# Patient Record
Sex: Male | Born: 1950 | Race: White | Hispanic: No | Marital: Married | Smoking: Former smoker
Health system: Southern US, Community
[De-identification: ages and names within clinical notes are randomized; demographics above are authoritative.]

## PROBLEM LIST (undated history)

## (undated) DIAGNOSIS — I4892 Unspecified atrial flutter: Principal | ICD-10-CM

## (undated) DIAGNOSIS — Z8739 Personal history of other diseases of the musculoskeletal system and connective tissue: Secondary | ICD-10-CM

## (undated) DIAGNOSIS — D474 Osteomyelofibrosis: Principal | ICD-10-CM

## (undated) DIAGNOSIS — Z862 Personal history of diseases of the blood and blood-forming organs and certain disorders involving the immune mechanism: Secondary | ICD-10-CM

## (undated) HISTORY — DX: Osteomyelofibrosis: D47.4

## (undated) HISTORY — PX: TONSILLECTOMY: SUR1361

## (undated) HISTORY — PX: OTHER SURGICAL HISTORY: SHX169

## (undated) HISTORY — PX: BACK SURGERY: SHX140

---

## 2003-07-15 ENCOUNTER — Other Ambulatory Visit: Admission: RE | Admit: 2003-07-15 | Discharge: 2003-07-15 | Payer: Self-pay | Admitting: Dermatology

## 2007-09-22 ENCOUNTER — Encounter: Admission: RE | Admit: 2007-09-22 | Discharge: 2007-09-22 | Payer: Self-pay | Admitting: Internal Medicine

## 2007-10-08 ENCOUNTER — Ambulatory Visit: Payer: Self-pay | Admitting: Hematology & Oncology

## 2007-11-10 LAB — CBC & DIFF AND RETIC
Basophils Absolute: 0 10*3/uL (ref 0.0–0.1)
EOS%: 0.3 % (ref 0.0–7.0)
HCT: 36.4 % — ABNORMAL LOW (ref 38.7–49.9)
HGB: 12.7 g/dL — ABNORMAL LOW (ref 13.0–17.1)
IRF: 0.47 — ABNORMAL HIGH (ref 0.070–0.380)
MCH: 31.8 pg (ref 28.0–33.4)
MCV: 91.3 fL (ref 81.6–98.0)
NEUT%: 88.6 % — ABNORMAL HIGH (ref 40.0–75.0)
Platelets: 342 10*3/uL (ref 145–400)
lymph#: 0.8 10*3/uL — ABNORMAL LOW (ref 0.9–3.3)

## 2007-11-10 LAB — CHCC SMEAR

## 2007-11-12 LAB — BETA 2 MICROGLOBULIN, SERUM: Beta-2 Microglobulin: 3.85 mg/L — ABNORMAL HIGH (ref 1.01–1.73)

## 2007-11-12 LAB — SPEP & IFE WITH QIG
Alpha-1-Globulin: 5.6 % — ABNORMAL HIGH (ref 2.9–4.9)
Alpha-2-Globulin: 8.3 % (ref 7.1–11.8)
Gamma Globulin: 12 % (ref 11.1–18.8)
Total Protein, Serum Electrophoresis: 6.8 g/dL (ref 6.0–8.3)

## 2007-11-12 LAB — HEMOGLOBINOPATHY EVALUATION
Hgb A2 Quant: 2.3 % (ref 2.2–3.2)
Hgb F Quant: 0.2 % (ref 0.0–2.0)

## 2007-12-30 ENCOUNTER — Ambulatory Visit: Payer: Self-pay | Admitting: Hematology & Oncology

## 2007-12-30 ENCOUNTER — Encounter: Payer: Self-pay | Admitting: Hematology & Oncology

## 2007-12-30 ENCOUNTER — Ambulatory Visit (HOSPITAL_COMMUNITY): Admission: RE | Admit: 2007-12-30 | Discharge: 2007-12-30 | Payer: Self-pay | Admitting: Hematology & Oncology

## 2008-01-05 ENCOUNTER — Ambulatory Visit: Payer: Self-pay | Admitting: Hematology & Oncology

## 2008-01-07 LAB — CBC WITH DIFFERENTIAL/PLATELET
Basophils Absolute: 0 10*3/uL (ref 0.0–0.1)
Eosinophils Absolute: 0.1 10*3/uL (ref 0.0–0.5)
HGB: 12.6 g/dL — ABNORMAL LOW (ref 13.0–17.1)
MCV: 92 fL (ref 81.6–98.0)
MONO#: 0.9 10*3/uL (ref 0.1–0.9)
NEUT#: 10.5 10*3/uL — ABNORMAL HIGH (ref 1.5–6.5)
RBC: 4.09 10*6/uL — ABNORMAL LOW (ref 4.20–5.71)
RDW: 19.8 % — ABNORMAL HIGH (ref 11.2–14.6)
WBC: 12.4 10*3/uL — ABNORMAL HIGH (ref 4.0–10.0)
lymph#: 0.9 10*3/uL (ref 0.9–3.3)

## 2008-01-07 LAB — CHCC SMEAR

## 2008-04-07 ENCOUNTER — Ambulatory Visit: Payer: Self-pay | Admitting: Hematology & Oncology

## 2008-04-08 LAB — CBC WITH DIFFERENTIAL (CANCER CENTER ONLY)
BASO#: 0.1 10*3/uL (ref 0.0–0.2)
Eosinophils Absolute: 0.2 10*3/uL (ref 0.0–0.5)
HCT: 38.1 % — ABNORMAL LOW (ref 38.7–49.9)
HGB: 12.9 g/dL — ABNORMAL LOW (ref 13.0–17.1)
LYMPH#: 1 10*3/uL (ref 0.9–3.3)
MCH: 30.4 pg (ref 28.0–33.4)
MCHC: 33.8 g/dL (ref 32.0–35.9)
NEUT#: 10.3 10*3/uL — ABNORMAL HIGH (ref 1.5–6.5)
NEUT%: 83.5 % — ABNORMAL HIGH (ref 40.0–80.0)
RBC: 4.24 10*6/uL (ref 4.20–5.70)

## 2008-04-08 LAB — CHCC SATELLITE - SMEAR

## 2008-08-10 ENCOUNTER — Ambulatory Visit: Payer: Self-pay | Admitting: Hematology & Oncology

## 2008-08-11 LAB — CHCC SATELLITE - SMEAR

## 2008-08-11 LAB — CBC WITH DIFFERENTIAL (CANCER CENTER ONLY)
BASO#: 0.1 10*3/uL (ref 0.0–0.2)
EOS%: 1 % (ref 0.0–7.0)
HCT: 38.7 % (ref 38.7–49.9)
HGB: 13 g/dL (ref 13.0–17.1)
LYMPH#: 1 10*3/uL (ref 0.9–3.3)
MCHC: 33.5 g/dL (ref 32.0–35.9)
MONO#: 0.9 10*3/uL (ref 0.1–0.9)
NEUT#: 9.7 10*3/uL — ABNORMAL HIGH (ref 1.5–6.5)
NEUT%: 82.2 % — ABNORMAL HIGH (ref 40.0–80.0)
RBC: 4.23 10*6/uL (ref 4.20–5.70)
WBC: 11.8 10*3/uL — ABNORMAL HIGH (ref 4.0–10.0)

## 2009-01-04 ENCOUNTER — Ambulatory Visit: Payer: Self-pay | Admitting: Hematology & Oncology

## 2009-01-06 LAB — RETICULOCYTES (CHCC): Retic Ct Pct: 2.4 % (ref 0.4–3.1)

## 2009-01-06 LAB — CBC WITH DIFFERENTIAL (CANCER CENTER ONLY)
BASO%: 0.5 % (ref 0.0–2.0)
EOS%: 1.2 % (ref 0.0–7.0)
HCT: 37.3 % — ABNORMAL LOW (ref 38.7–49.9)
LYMPH#: 0.9 10*3/uL (ref 0.9–3.3)
MCHC: 33.2 g/dL (ref 32.0–35.9)
MONO#: 0.5 10*3/uL (ref 0.1–0.9)
NEUT#: 8.4 10*3/uL — ABNORMAL HIGH (ref 1.5–6.5)
Platelets: 283 10*3/uL (ref 145–400)
RDW: 17.5 % — ABNORMAL HIGH (ref 10.5–14.6)
WBC: 10 10*3/uL (ref 4.0–10.0)

## 2009-01-13 LAB — JAK2 GENOTYPR: JAK2 GenotypR: DETECTED

## 2009-06-08 ENCOUNTER — Ambulatory Visit: Payer: Self-pay | Admitting: Hematology & Oncology

## 2009-06-09 LAB — CBC WITH DIFFERENTIAL (CANCER CENTER ONLY)
BASO#: 0.1 10*3/uL (ref 0.0–0.2)
Eosinophils Absolute: 0.2 10*3/uL (ref 0.0–0.5)
HCT: 36.4 % — ABNORMAL LOW (ref 38.7–49.9)
HGB: 12.2 g/dL — ABNORMAL LOW (ref 13.0–17.1)
LYMPH%: 8.3 % — ABNORMAL LOW (ref 14.0–48.0)
MCV: 88 fL (ref 82–98)
MONO#: 0.8 10*3/uL (ref 0.1–0.9)
NEUT%: 83.7 % — ABNORMAL HIGH (ref 40.0–80.0)
RBC: 4.14 10*6/uL — ABNORMAL LOW (ref 4.20–5.70)
WBC: 13.2 10*3/uL — ABNORMAL HIGH (ref 4.0–10.0)

## 2009-12-16 ENCOUNTER — Ambulatory Visit: Payer: Self-pay | Admitting: Hematology & Oncology

## 2009-12-19 LAB — CHCC SATELLITE - SMEAR

## 2009-12-19 LAB — CBC WITH DIFFERENTIAL (CANCER CENTER ONLY)
BASO%: 0.5 % (ref 0.0–2.0)
EOS%: 1.3 % (ref 0.0–7.0)
Eosinophils Absolute: 0.1 10*3/uL (ref 0.0–0.5)
HCT: 32.1 % — ABNORMAL LOW (ref 38.7–49.9)
LYMPH#: 1 10*3/uL (ref 0.9–3.3)
LYMPH%: 10 % — ABNORMAL LOW (ref 14.0–48.0)
MCH: 29.7 pg (ref 28.0–33.4)
MCHC: 33 g/dL (ref 32.0–35.9)
MONO#: 0.6 10*3/uL (ref 0.1–0.9)
MONO%: 5.8 % (ref 0.0–13.0)
NEUT#: 8.6 10*3/uL — ABNORMAL HIGH (ref 1.5–6.5)
NEUT%: 82.4 % — ABNORMAL HIGH (ref 40.0–80.0)
RBC: 3.57 10*6/uL — ABNORMAL LOW (ref 4.20–5.70)
RDW: 18.1 % — ABNORMAL HIGH (ref 10.5–14.6)
WBC: 10.4 10*3/uL — ABNORMAL HIGH (ref 4.0–10.0)

## 2010-03-21 ENCOUNTER — Ambulatory Visit: Payer: Self-pay | Admitting: Hematology & Oncology

## 2010-03-22 ENCOUNTER — Ambulatory Visit: Payer: Self-pay | Admitting: Diagnostic Radiology

## 2010-03-22 ENCOUNTER — Ambulatory Visit (HOSPITAL_BASED_OUTPATIENT_CLINIC_OR_DEPARTMENT_OTHER): Admission: RE | Admit: 2010-03-22 | Discharge: 2010-03-22 | Payer: Self-pay | Admitting: Hematology & Oncology

## 2010-03-22 LAB — CBC WITH DIFFERENTIAL (CANCER CENTER ONLY)
BASO#: 0 10*3/uL (ref 0.0–0.2)
BASO%: 0.4 % (ref 0.0–2.0)
LYMPH%: 10.4 % — ABNORMAL LOW (ref 14.0–48.0)
MCHC: 33.4 g/dL (ref 32.0–35.9)
MCV: 89 fL (ref 82–98)
NEUT%: 81.9 % — ABNORMAL HIGH (ref 40.0–80.0)
Platelets: 231 10*3/uL (ref 145–400)
RBC: 3.26 10*6/uL — ABNORMAL LOW (ref 4.20–5.70)

## 2010-03-22 LAB — COMPREHENSIVE METABOLIC PANEL
Alkaline Phosphatase: 99 U/L (ref 39–117)
BUN: 13 mg/dL (ref 6–23)
Calcium: 8.7 mg/dL (ref 8.4–10.5)
Chloride: 109 mEq/L (ref 96–112)
Creatinine, Ser: 1.44 mg/dL (ref 0.40–1.50)
Total Protein: 6.2 g/dL (ref 6.0–8.3)

## 2010-03-22 LAB — CHCC SATELLITE - SMEAR

## 2010-03-22 LAB — LACTATE DEHYDROGENASE: LDH: 246 U/L (ref 94–250)

## 2010-03-30 ENCOUNTER — Ambulatory Visit: Payer: Self-pay | Admitting: Hematology & Oncology

## 2010-03-30 ENCOUNTER — Ambulatory Visit (HOSPITAL_COMMUNITY): Admission: RE | Admit: 2010-03-30 | Discharge: 2010-03-30 | Payer: Self-pay | Admitting: Hematology & Oncology

## 2010-05-18 ENCOUNTER — Ambulatory Visit: Payer: Self-pay | Admitting: Hematology & Oncology

## 2010-06-14 LAB — CBC WITH DIFFERENTIAL (CANCER CENTER ONLY)
Eosinophils Absolute: 0.1 10*3/uL (ref 0.0–0.5)
HCT: 31.4 % — ABNORMAL LOW (ref 38.7–49.9)
MCH: 29.7 pg (ref 28.0–33.4)
MCV: 89 fL (ref 82–98)
MONO#: 0.8 10*3/uL (ref 0.1–0.9)
NEUT%: 78.4 % (ref 40.0–80.0)
WBC: 9.7 10*3/uL (ref 4.0–10.0)

## 2010-10-11 ENCOUNTER — Other Ambulatory Visit: Payer: Self-pay | Admitting: Hematology & Oncology

## 2010-10-11 ENCOUNTER — Encounter (HOSPITAL_BASED_OUTPATIENT_CLINIC_OR_DEPARTMENT_OTHER): Payer: BC Managed Care – PPO | Admitting: Hematology & Oncology

## 2010-10-11 DIAGNOSIS — D7581 Myelofibrosis: Secondary | ICD-10-CM

## 2010-10-11 DIAGNOSIS — D649 Anemia, unspecified: Secondary | ICD-10-CM

## 2010-10-11 LAB — CBC WITH DIFFERENTIAL (CANCER CENTER ONLY)
BASO#: 0 10*3/uL (ref 0.0–0.2)
HCT: 31.5 % — ABNORMAL LOW (ref 38.7–49.9)
HGB: 9.7 g/dL — ABNORMAL LOW (ref 13.0–17.1)
LYMPH#: 0.7 10*3/uL — ABNORMAL LOW (ref 0.9–3.3)
LYMPH%: 8.3 % — ABNORMAL LOW (ref 14.0–48.0)
MCH: 28.5 pg (ref 28.0–33.4)
MONO#: 0.6 10*3/uL (ref 0.1–0.9)
NEUT#: 7 10*3/uL — ABNORMAL HIGH (ref 1.5–6.5)
NEUT%: 83.5 % — ABNORMAL HIGH (ref 40.0–80.0)
RBC: 3.4 10*6/uL — ABNORMAL LOW (ref 4.20–5.70)
WBC: 8.3 10*3/uL (ref 4.0–10.0)

## 2010-10-11 LAB — TECHNOLOGIST REVIEW CHCC SATELLITE

## 2010-10-11 LAB — CHCC SATELLITE - SMEAR

## 2010-10-12 LAB — CBC
HCT: 27.7 % — ABNORMAL LOW (ref 39.0–52.0)
MCH: 30 pg (ref 26.0–34.0)
RBC: 3.06 MIL/uL — ABNORMAL LOW (ref 4.22–5.81)
WBC: 8.1 10*3/uL (ref 4.0–10.5)

## 2010-10-12 LAB — LACTATE DEHYDROGENASE: LDH: 264 U/L — ABNORMAL HIGH (ref 94–250)

## 2010-10-12 LAB — DIFFERENTIAL
Eosinophils Absolute: 0 10*3/uL (ref 0.0–0.7)
Eosinophils Relative: 0 % (ref 0–5)
Neutrophils Relative %: 86 % — ABNORMAL HIGH (ref 43–77)

## 2010-10-12 LAB — RETICULOCYTES (CHCC)
ABS Retic: 88.7 10*3/uL (ref 19.0–186.0)
Retic Ct Pct: 2.6 % (ref 0.4–3.1)

## 2010-10-12 LAB — COMPREHENSIVE METABOLIC PANEL
ALT: 15 U/L (ref 0–53)
Albumin: 4.6 g/dL (ref 3.5–5.2)
Calcium: 9.3 mg/dL (ref 8.4–10.5)
Glucose, Bld: 94 mg/dL (ref 70–99)
Total Protein: 6.6 g/dL (ref 6.0–8.3)

## 2010-10-12 LAB — FERRITIN: Ferritin: 343 ng/mL — ABNORMAL HIGH (ref 22–322)

## 2010-10-12 LAB — BONE MARROW EXAM: Bone Marrow Exam: 627

## 2010-12-12 NOTE — Op Note (Signed)
NAME:  MERIC, JOYE NO.:  [id number]   MEDICAL RECORD NO.:  [id number]          PATIENT TYPE:  OUT   LOCATION:  [facility]                         FACILITY:  [facility]   PHYSICIAN:  Rose Phi. Myna Hidalgo, M.D. DATE OF BIRTH:  [birth year]   DATE OF PROCEDURE:  12/30/2007  DATE OF DISCHARGE:                               OPERATIVE REPORT   PROCEDURE:  Left posterior iliac crest bone marrow biopsy and aspirate.   Mr. Mickelson was brought to the short stay unit.  He had an IV placed into  his left hand.  He was then placed onto his right side.   He had a Mallampati score of 1.  His ASA score was 1.  The time-out was  performed.   He had a total of 7.5 mg of Versed and 25 mg of Demerol for IV sedation.   The left posterior iliac crest region was prepped and draped in a  sterile fashion.  Lidocaine 2% 10 mL was infiltrated under the skin down  to the periosteum.  Despite three attempts with an aspirate needle, I  could not aspirate any liquid marrow.   We then proceeded with the bone marrow biopsy.  I even tried to aspirate  through the bone marrow biopsy needle.  This was negative for any liquid  marrow.  However, we got an excellent bone marrow biopsy core that was  about 4 cm in length.  This will be used for flow cytometry and  cytogenetics.   The patient tolerated the procedure well.  There were no complications.      Rose Phi. Myna Hidalgo, M.D.  Electronically Signed     PRE/MEDQ  D:  12/30/2007  T:  12/30/2007  Job:  [id number]

## 2011-01-09 ENCOUNTER — Encounter (HOSPITAL_BASED_OUTPATIENT_CLINIC_OR_DEPARTMENT_OTHER): Payer: BC Managed Care – PPO | Admitting: Hematology & Oncology

## 2011-01-09 ENCOUNTER — Other Ambulatory Visit: Payer: Self-pay | Admitting: Hematology & Oncology

## 2011-01-09 DIAGNOSIS — D474 Osteomyelofibrosis: Secondary | ICD-10-CM

## 2011-01-09 DIAGNOSIS — D7581 Myelofibrosis: Secondary | ICD-10-CM

## 2011-01-09 DIAGNOSIS — D649 Anemia, unspecified: Secondary | ICD-10-CM

## 2011-01-09 LAB — CBC WITH DIFFERENTIAL (CANCER CENTER ONLY)
HCT: 31 % — ABNORMAL LOW (ref 38.7–49.9)
LYMPH#: 0.7 10*3/uL — ABNORMAL LOW (ref 0.9–3.3)
LYMPH%: 8 % — ABNORMAL LOW (ref 14.0–48.0)
MCH: 28.7 pg (ref 28.0–33.4)
MCV: 93 fL (ref 82–98)
MONO%: 6 % (ref 0.0–13.0)
NEUT#: 7.9 10*3/uL — ABNORMAL HIGH (ref 1.5–6.5)
RBC: 3.34 10*6/uL — ABNORMAL LOW (ref 4.20–5.70)

## 2011-01-09 LAB — RETICULOCYTES (CHCC)
ABS Retic: 102.9 10*3/uL (ref 19.0–186.0)
Retic Ct Pct: 3 % (ref 0.4–3.1)

## 2011-01-09 LAB — LACTATE DEHYDROGENASE: LDH: 262 U/L — ABNORMAL HIGH (ref 94–250)

## 2011-01-09 LAB — CHCC SATELLITE - SMEAR

## 2011-04-04 ENCOUNTER — Other Ambulatory Visit: Payer: Self-pay | Admitting: Hematology & Oncology

## 2011-04-04 ENCOUNTER — Encounter (HOSPITAL_BASED_OUTPATIENT_CLINIC_OR_DEPARTMENT_OTHER): Payer: BC Managed Care – PPO | Admitting: Hematology & Oncology

## 2011-04-04 DIAGNOSIS — D649 Anemia, unspecified: Secondary | ICD-10-CM

## 2011-04-04 DIAGNOSIS — D7581 Myelofibrosis: Secondary | ICD-10-CM

## 2011-04-04 LAB — CBC WITH DIFFERENTIAL (CANCER CENTER ONLY)
BASO#: 0 10*3/uL (ref 0.0–0.2)
EOS%: 0.2 % (ref 0.0–7.0)
Eosinophils Absolute: 0 10*3/uL (ref 0.0–0.5)
HGB: 9.9 g/dL — ABNORMAL LOW (ref 13.0–17.1)
LYMPH%: 8.1 % — ABNORMAL LOW (ref 14.0–48.0)
MCH: 29.8 pg (ref 28.0–33.4)
MCHC: 32.1 g/dL (ref 32.0–35.9)
MCV: 93 fL (ref 82–98)
MONO%: 5.2 % (ref 0.0–13.0)
NEUT%: 86.3 % — ABNORMAL HIGH (ref 40.0–80.0)
RBC: 3.32 10*6/uL — ABNORMAL LOW (ref 4.20–5.70)

## 2011-04-05 LAB — RETICULOCYTES (CHCC)
ABS Retic: 98 10*3/uL (ref 19.0–186.0)
RBC.: 3.5 MIL/uL — ABNORMAL LOW (ref 4.22–5.81)
Retic Ct Pct: 2.8 % — ABNORMAL HIGH (ref 0.4–2.3)

## 2011-04-26 LAB — DIFFERENTIAL
Basophils Absolute: 0.1
Basophils Relative: 1
Eosinophils Absolute: 0
Eosinophils Relative: 0
Monocytes Absolute: 0.7
Neutro Abs: 7.9 — ABNORMAL HIGH

## 2011-04-26 LAB — CBC
HCT: 37.5 — ABNORMAL LOW
Hemoglobin: 12.8 — ABNORMAL LOW
MCHC: 34
RDW: 20.2 — ABNORMAL HIGH

## 2011-07-04 ENCOUNTER — Telehealth: Payer: Self-pay | Admitting: *Deleted

## 2011-07-04 ENCOUNTER — Ambulatory Visit (HOSPITAL_BASED_OUTPATIENT_CLINIC_OR_DEPARTMENT_OTHER): Payer: BC Managed Care – PPO | Admitting: Hematology & Oncology

## 2011-07-04 ENCOUNTER — Other Ambulatory Visit: Payer: Self-pay | Admitting: Family

## 2011-07-04 ENCOUNTER — Encounter: Payer: Self-pay | Admitting: Hematology & Oncology

## 2011-07-04 ENCOUNTER — Other Ambulatory Visit (HOSPITAL_BASED_OUTPATIENT_CLINIC_OR_DEPARTMENT_OTHER): Payer: BC Managed Care – PPO | Admitting: Lab

## 2011-07-04 VITALS — BP 145/70 | HR 83 | Temp 97.7°F | Ht 71.5 in | Wt 168.0 lb

## 2011-07-04 DIAGNOSIS — D471 Chronic myeloproliferative disease: Secondary | ICD-10-CM

## 2011-07-04 DIAGNOSIS — R161 Splenomegaly, not elsewhere classified: Secondary | ICD-10-CM

## 2011-07-04 DIAGNOSIS — D474 Osteomyelofibrosis: Secondary | ICD-10-CM

## 2011-07-04 HISTORY — DX: Osteomyelofibrosis: D47.4

## 2011-07-04 HISTORY — DX: Chronic myeloproliferative disease: D47.1

## 2011-07-04 LAB — CBC WITH DIFFERENTIAL (CANCER CENTER ONLY)
BASO%: 0.3 % (ref 0.0–2.0)
LYMPH%: 9.1 % — ABNORMAL LOW (ref 14.0–48.0)
MCH: 29.5 pg (ref 28.0–33.4)
MCHC: 31.6 g/dL — ABNORMAL LOW (ref 32.0–35.9)
MCV: 93 fL (ref 82–98)
MONO%: 6.8 % (ref 0.0–13.0)
Platelets: 203 10*3/uL (ref 145–400)
RDW: 21 % — ABNORMAL HIGH (ref 11.1–15.7)

## 2011-07-04 LAB — TECHNOLOGIST REVIEW CHCC SATELLITE: Tech Review: 1

## 2011-07-04 NOTE — Progress Notes (Signed)
CC:   Larina Earthly, M.D. Gorden Harms, MD  DIAGNOSIS:  Myelofibrosis with myeloid metaplasia, JAK2 positive.  CURRENT THERAPY:  Observation.  INTERIM HISTORY:  Mr. Vanbenschoten comes in for followup.  He is doing okay. 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  He has had no fatigue or weakness.  He has had no abdominal pain.  He has had no change in bowel or bladder habits.  He has not noticed any kind of bony pain.  There is no fever or sweats.  PHYSICAL EXAMINATION:  General:  This is a well-developed, well- nourished white gentleman in no obvious distress.  Vital Signs: Temperature 97.7, pulse 83, respiratory rate 18, blood pressure 145/70. Weight 168.  Head/Neck:  Exam shows a normocephalic atraumatic skull. There are no ocular or oral lesions.  There are no palpable cervical or supraclavicular lymph nodes.  Lungs:  Clear bilaterally.  Cardiac: Regular rate and rhythm with a normal S1 and S2.  There are no murmurs, rubs or bruits.  Abdomen:  Soft with good bowel sounds.  There is no palpable abdominal mass.  He has no fluid wave.  He has no guarding or rebound tenderness.  His spleen tip is about 5-6 cm below the left costal margin.  Liver edge might be at the right costal margin. Extremities:  No clubbing, cyanosis or edema.  He has good range motion of his joints.  Skin:  No rashes, ecchymosis or petechiae.  Neurologic: No focal neurological deficits.  LABORATORY STUDIES:  White cell count 7.6, hemoglobin 9.4, hematocrit 27.8, platelet count 203.  IMPRESSION AND PLAN:  Mr. Parke is a 60 year old white gentleman with myelofibrosis.  He is asymptomatic with this.  His splenomegaly is about stable.  I really want to try to hold off on treating his myelofibrosis.  Again, he is asymptomatic.  I do not think that we would be offering him much benefit by giving him Jakafi right now.  We will go ahead and plan to get him  back in another 2-3 months.  I think his anemia will be the "driver" for his decision for therapy.    ______________________________ Josph Macho, M.D. PRE/MEDQ  D:  07/04/2011  T:  07/04/2011  Job:  [id number]

## 2011-07-04 NOTE — Progress Notes (Signed)
This office note has been dictated.

## 2011-07-04 NOTE — Telephone Encounter (Signed)
Mailed 08-2011 schedule °

## 2011-09-19 ENCOUNTER — Other Ambulatory Visit: Payer: BC Managed Care – PPO | Admitting: Lab

## 2011-09-19 ENCOUNTER — Ambulatory Visit: Payer: BC Managed Care – PPO | Admitting: Hematology & Oncology

## 2011-09-20 ENCOUNTER — Other Ambulatory Visit (HOSPITAL_BASED_OUTPATIENT_CLINIC_OR_DEPARTMENT_OTHER): Payer: BC Managed Care – PPO | Admitting: Lab

## 2011-09-20 ENCOUNTER — Ambulatory Visit (HOSPITAL_BASED_OUTPATIENT_CLINIC_OR_DEPARTMENT_OTHER): Payer: BC Managed Care – PPO | Admitting: Hematology & Oncology

## 2011-09-20 VITALS — BP 139/62 | HR 78 | Temp 98.8°F | Ht 71.0 in | Wt 167.5 lb

## 2011-09-20 DIAGNOSIS — D474 Osteomyelofibrosis: Secondary | ICD-10-CM

## 2011-09-20 DIAGNOSIS — D649 Anemia, unspecified: Secondary | ICD-10-CM

## 2011-09-20 DIAGNOSIS — D7581 Myelofibrosis: Secondary | ICD-10-CM

## 2011-09-20 LAB — CBC WITH DIFFERENTIAL (CANCER CENTER ONLY)
BASO#: 0 10*3/uL (ref 0.0–0.2)
BASO%: 0.3 % (ref 0.0–2.0)
HCT: 28.1 % — ABNORMAL LOW (ref 38.7–49.9)
HGB: 8.6 g/dL — ABNORMAL LOW (ref 13.0–17.1)
LYMPH%: 9.8 % — ABNORMAL LOW (ref 14.0–48.0)
MCHC: 30.6 g/dL — ABNORMAL LOW (ref 32.0–35.9)
MCV: 94 fL (ref 82–98)
MONO#: 0.5 10*3/uL (ref 0.1–0.9)
NEUT%: 83.5 % — ABNORMAL HIGH (ref 40.0–80.0)
RDW: 21.3 % — ABNORMAL HIGH (ref 11.1–15.7)
WBC: 8.7 10*3/uL (ref 4.0–10.0)

## 2011-09-20 NOTE — Progress Notes (Signed)
This office note has been dictated.

## 2011-09-21 ENCOUNTER — Telehealth: Payer: Self-pay | Admitting: Hematology & Oncology

## 2011-09-21 NOTE — Progress Notes (Signed)
CC:   Brian Bowen, M.D. Brian Harms, MD  DIAGNOSIS:  Myelofibrosis-JAK2 positive.  CURRENT THERAPY:  Observation.  INTERIM HISTORY:  Brian Bowen comes in for followup.  He is doing okay. He is having more in the way of fatigue.  He is still working full-time on his farm.  However, he says he has to stop a little bit more in order to catch up with work.  He has not had any problems with bleeding.  There has been no fever.  He has had no abdominal pain.  His appetite seems to be doing fairly good.  He is having no problems going to the bathroom.  He has maybe 2-3 bowel movements a day which has been normal for him.  PHYSICAL EXAM:  This is a well-developed well-nourished white gentleman in no obvious distress.  Vital signs:  98.8, pulse 78, respiratory rate 16, blood pressure 139/62, weight is 167.  Head and neck: Normocephalic, atraumatic skull.  There are no ocular or oral lesions. There are no palpable cervical or supraclavicular lymph nodes.  Lungs: Clear bilaterally.  Cardiac:  Regular rate and rhythm with normal S1, S2.  There are no murmurs, rubs, or bruits.  Abdomen:  Soft with good bowel sounds.  He has no fluid wave.  There is no guarding or rebound tenderness.  There is no palpable liver edge.  His spleen tip is probably about 10 cm below the left costal margin.  The spleen tip is smooth.  Back: No tenderness over the spine, ribs, or hips. Extremities:  No clubbing, cyanosis or edema.  Skin:  No rashes, ecchymoses or petechia.  Neurological:  No focal neurological deficits.  LABORATORY STUDIES:  White cell count is 8.7, hemoglobin 8.6, hematocrit 28.1, platelet count 219.  MCV is 94.  His peripheral smear shows moderate anisocytosis and poikilocytosis.  He has scattered nucleated red blood cells.  He has some teardrop cells. He has a couple of schistocytes.  There are no blasts.  White cells appear pretty normal in morphology.  He may have a couple of hypersegmented  polys.  There are a couple myelocytes and metamyelocytes. Platelets are adequate in number and size.  IMPRESSION:  Brian Bowen is a 61 year old gentleman with myelofibrosis. He clearly is progressing from my point of view.  His anemia is worsening.  As such, I think we are going to have to consider therapy for him.  I think we are going to have to do a bone marrow test on him.  I really need to see how his marrow looks compared to his previous marrow which was probably 2 or 3 years ago.  Since he is JAK2 positive, I think it would be worthwhile considering the new JAK inhibitor-Jakafi.  I think this would be a decent option.  We will set him up with a bone marrow biopsy for next week.  I will see Brian Bowen back in a month at which point in time will plan for definitive therapy.    ______________________________ Josph Macho, M.D. PRE/MEDQ  D:  09/20/2011  T:  09/21/2011  Job:  [id number]

## 2011-09-21 NOTE — Telephone Encounter (Signed)
Pt aware of 10-26-11 appointment

## 2011-09-27 ENCOUNTER — Encounter (HOSPITAL_COMMUNITY): Payer: Self-pay | Admitting: Pharmacy Technician

## 2011-10-01 ENCOUNTER — Other Ambulatory Visit: Payer: Self-pay | Admitting: *Deleted

## 2011-10-01 DIAGNOSIS — D474 Osteomyelofibrosis: Secondary | ICD-10-CM

## 2011-10-02 ENCOUNTER — Ambulatory Visit (HOSPITAL_COMMUNITY)
Admission: RE | Admit: 2011-10-02 | Discharge: 2011-10-02 | Disposition: A | Discharge status: Home / Self Care | Payer: BC Managed Care – PPO | Source: Ambulatory Visit | Attending: Hematology & Oncology | Admitting: Hematology & Oncology

## 2011-10-02 ENCOUNTER — Ambulatory Visit: Payer: BC Managed Care – PPO | Admitting: Hematology & Oncology

## 2011-10-02 DIAGNOSIS — D474 Osteomyelofibrosis: Secondary | ICD-10-CM

## 2011-10-02 DIAGNOSIS — D7581 Myelofibrosis: Secondary | ICD-10-CM | POA: Insufficient documentation

## 2011-10-02 DIAGNOSIS — D649 Anemia, unspecified: Secondary | ICD-10-CM | POA: Insufficient documentation

## 2011-10-02 LAB — DIFFERENTIAL
Basophils Absolute: 0.1 10*3/uL (ref 0.0–0.1)
Basophils Relative: 1 % (ref 0–1)
Eosinophils Relative: 0 % (ref 0–5)
Lymphs Abs: 0.7 10*3/uL (ref 0.7–4.0)
Monocytes Relative: 7 % (ref 3–12)
Neutro Abs: 5.3 10*3/uL (ref 1.7–7.7)

## 2011-10-02 LAB — CBC
HCT: 26.5 % — ABNORMAL LOW (ref 39.0–52.0)
Hemoglobin: 8.2 g/dL — ABNORMAL LOW (ref 13.0–17.0)
MCV: 91.7 fL (ref 78.0–100.0)
RDW: 21.3 % — ABNORMAL HIGH (ref 11.5–15.5)
WBC: 6.6 10*3/uL (ref 4.0–10.5)

## 2011-10-02 MED ORDER — MIDAZOLAM HCL 10 MG/2ML IJ SOLN
INTRAMUSCULAR | Status: AC
  Filled 2011-10-02: qty 2

## 2011-10-02 MED ORDER — SODIUM CHLORIDE 0.9 % IV SOLN
INTRAVENOUS | Status: DC
  Administered 2011-10-02: 07:00:00 via INTRAVENOUS

## 2011-10-02 MED ORDER — MEPERIDINE HCL 25 MG/ML IJ SOLN
INTRAMUSCULAR | Status: AC | PRN
  Administered 2011-10-02 (×2): 12.5 mg via INTRAVENOUS

## 2011-10-02 MED ORDER — MIDAZOLAM HCL 5 MG/5ML IJ SOLN
INTRAMUSCULAR | Status: AC | PRN
  Administered 2011-10-02 (×3): 1 mg via INTRAVENOUS
  Administered 2011-10-02: 2 mg via INTRAVENOUS

## 2011-10-02 MED ORDER — MEPERIDINE HCL 50 MG/ML IJ SOLN
INTRAMUSCULAR | Status: AC
  Filled 2011-10-02: qty 1

## 2011-10-02 NOTE — Discharge Instructions (Signed)
Do not drive  For 24 hours Do not go into public places today May resume your regular diet and take home medications as usual May experience small amount of tingling in leg (biopsy side) May take shower and remove bandage in am For any questions or concerns, call Dr Myna Hidalgo If bleeding occurs at site, hold pressure x10 minutes  If continues, call doctor

## 2011-10-02 NOTE — Sedation Documentation (Signed)
Medication dose calculated and verified for:Versed 5mg Demerol 25mg 

## 2011-10-02 NOTE — ED Notes (Signed)
dsg post iliac remains CDI and pt remains supine with towel to site for pressure

## 2011-10-02 NOTE — ED Notes (Signed)
Patient is resting comfortably. 

## 2011-10-02 NOTE — ED Notes (Signed)
Sitting on edge of bed and ambulated in room tolerated this well Dsg remains CDI

## 2011-10-02 NOTE — ED Notes (Signed)
Procedure ends . dsg to post iliac area and pt placed supine with towel to site for pressure

## 2011-10-03 NOTE — Procedures (Signed)
NATURE OF PROCEDURE:  Left posterior iliac crest bone marrow biopsy and aspirate.  Mr. Correll was brought to short-stay unit at [facility].  He had the appropriate time-out procedure performed.  He had an IV placed peripherally without any difficulties.  His Mallampati score was 1.  His ASA class was 1.  He received a total of 5 mg of Versed and 25 mg of Demerol for IV sedation.  The left posterior iliac crest region was prepped and draped in sterile fashion.  10 mL of 2% lidocaine was infiltrated under the skin down to the periosteum.  A #11 scalpel was used to make an incision to the skin.  We attempted a bone marrow aspirate.  However, this was not obtained.  We then used a Jamshidi biopsy needle.  We go an excellent biopsy core. The probably measured about 4 cm.  Part of the core was sent to pathology.  The other part was sent to flow cytometry and cytogenetics.  The left posterior iliac crest region was dressed sterilely.  The patient tolerated the procedure well.  There were no complications.    ______________________________ Josph Macho, M.D. PRE/MEDQ  D:  10/02/2011  T:  10/02/2011  Job:  [id number]

## 2011-10-26 ENCOUNTER — Other Ambulatory Visit (HOSPITAL_BASED_OUTPATIENT_CLINIC_OR_DEPARTMENT_OTHER): Payer: BC Managed Care – PPO | Admitting: Lab

## 2011-10-26 ENCOUNTER — Ambulatory Visit (HOSPITAL_BASED_OUTPATIENT_CLINIC_OR_DEPARTMENT_OTHER): Payer: BC Managed Care – PPO | Admitting: Hematology & Oncology

## 2011-10-26 VITALS — BP 129/62 | HR 89 | Temp 97.6°F | Ht 71.0 in | Wt 166.0 lb

## 2011-10-26 DIAGNOSIS — D474 Osteomyelofibrosis: Secondary | ICD-10-CM

## 2011-10-26 DIAGNOSIS — D7581 Myelofibrosis: Secondary | ICD-10-CM

## 2011-10-26 DIAGNOSIS — R161 Splenomegaly, not elsewhere classified: Secondary | ICD-10-CM

## 2011-10-26 DIAGNOSIS — D649 Anemia, unspecified: Secondary | ICD-10-CM

## 2011-10-26 LAB — CBC WITH DIFFERENTIAL (CANCER CENTER ONLY)
BASO#: 0 10*3/uL (ref 0.0–0.2)
Eosinophils Absolute: 0 10*3/uL (ref 0.0–0.5)
HGB: 7.1 g/dL — ABNORMAL LOW (ref 13.0–17.1)
LYMPH#: 0.8 10*3/uL — ABNORMAL LOW (ref 0.9–3.3)
MONO#: 0.5 10*3/uL (ref 0.1–0.9)
NEUT#: 5.9 10*3/uL (ref 1.5–6.5)
RBC: 2.43 10*6/uL — ABNORMAL LOW (ref 4.20–5.70)
WBC: 7.1 10*3/uL (ref 4.0–10.0)

## 2011-10-26 NOTE — Progress Notes (Signed)
This office note has been dictated.

## 2011-10-26 NOTE — Progress Notes (Signed)
CC:   Brian Bowen, M.D. Brian Harms, MD  DIAGNOSIS:  Myelofibrosis-JAK2 positive.  CURRENT THERAPY:  Observation.  INTERVAL HISTORY:  Brian Bowen comes in for followup.  We did go ahead and do another bone marrow test on him.  This was done on 10/02/2011. The bone marrow results ([id number]) showed markedly hypercellular marrow with increase in reticulin staining.  There was actually no change in the marrow compared to what we had done previously.  I think his last bone marrow was probably 2 years ago.  We did get cytogenetics on the bone marrow.  Cytogenetics were negative for any abnormality.  He did not have any increase in blasts.  He feels well.  He has not noted any pain issues.  He is eating okay. He has had no nausea or vomiting.  He has had no constipation.  He has had no bony pain. He has had no rashes.  He does have a little bit of a cough.  He says he gets this time a year from allergies.  PHYSICAL EXAM:  General: This is a well-developed, well-nourished, white gentleman in no obvious distress.  Vital signs: 97.6, pulse 89, respiratory 18, blood pressure 129/60, and weight is 166.  Head and neck exam shows a normocephalic, atraumatic skull.  There are no ocular or oral lesions.  There are no palpable cervical or supraclavicular lymph nodes.  Lungs are clear bilaterally.  Cardiac examination:  Regular rate and rhythm with a normal S1 and S2.  There are no murmurs, rubs, or bruits.  Abdominal exam: Soft with good bowel sounds.  There is no palpable abdominal mass.  There is no fluid wave.  There is no palpable hepatomegaly.  Spleen tip is down close to the umbilicus.  Extremities: Shows no clubbing, cyanosis, or edema.  Neurological exam: Shows no focal neurological deficits.  Skin exam: Shows no rashes, ecchymoses, or petechia.  LABORATORY STUDIES:  White cell count is 7.2, hemoglobin 7.1, hematocrit 23.5, and platelet count is 214.  IMPRESSION:  Brian Bowen is a  61 year old gentleman with myelofibrosis. We have been following him now for about 4 years or so.  His anemia clearly is more of a problem now.  I have to believe that this is reflective of his splenomegaly.  He has not had any type of thrombolysis as far as I can tell.  I think that we are going to have to get him on treatment.  I am going to call Dr. Gretel Acre at [facility].  He has seen Brian Bowen in the past.  I want to see if there is any clinical trial that they have for Brian Bowen.  If not, then I think he would be great candidate for Jakafi.  Brian Bowen is not symptomatic.  This indicates that his anemia is more chronic and slowly progressive.  I would like to hope that there is a trial that he would qualify for. If not, then again we will treat him.  We certainly may need to transfuse him, and I told him about this as anemia may become more of an issue with Jakafi before his blood count is better.  We will see about making a followup appointment for Brian Bowen, depending on his followup, if any at [facility].    ______________________________ Josph Macho, M.D. PRE/MEDQ  D:  10/26/2011  T:  10/26/2011  Job:  [id number]

## 2011-10-29 ENCOUNTER — Telehealth: Payer: Self-pay | Admitting: Hematology & Oncology

## 2011-10-29 NOTE — Telephone Encounter (Signed)
PT AWARE OF 11-21-11 APPOINTMENT

## 2011-10-30 LAB — COMPREHENSIVE METABOLIC PANEL
ALT: 10 U/L (ref 0–53)
AST: 16 U/L (ref 0–37)
CO2: 25 mEq/L (ref 19–32)
Calcium: 8.7 mg/dL (ref 8.4–10.5)
Chloride: 106 mEq/L (ref 96–112)
Sodium: 139 mEq/L (ref 135–145)
Total Protein: 6.2 g/dL (ref 6.0–8.3)

## 2011-10-30 LAB — IRON AND TIBC: %SAT: 33 % (ref 20–55)

## 2011-10-30 LAB — RETICULOCYTES (CHCC)
ABS Retic: 89.6 10*3/uL (ref 19.0–186.0)
RBC.: 2.49 MIL/uL — ABNORMAL LOW (ref 4.22–5.81)
Retic Ct Pct: 3.6 % — ABNORMAL HIGH (ref 0.4–2.3)

## 2011-10-30 LAB — ERYTHROPOIETIN: Erythropoietin: 682 m[IU]/mL — ABNORMAL HIGH (ref 2.6–34.0)

## 2011-10-30 LAB — FERRITIN: Ferritin: 357 ng/mL — ABNORMAL HIGH (ref 22–322)

## 2011-10-31 ENCOUNTER — Other Ambulatory Visit: Payer: Self-pay | Admitting: Hematology & Oncology

## 2011-11-19 ENCOUNTER — Telehealth: Payer: Self-pay | Admitting: Hematology & Oncology

## 2011-11-19 NOTE — Telephone Encounter (Signed)
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

## 2011-11-21 ENCOUNTER — Ambulatory Visit: Payer: BC Managed Care – PPO | Admitting: Hematology & Oncology

## 2011-11-21 ENCOUNTER — Other Ambulatory Visit: Payer: BC Managed Care – PPO | Admitting: Lab

## 2011-11-23 ENCOUNTER — Other Ambulatory Visit (HOSPITAL_BASED_OUTPATIENT_CLINIC_OR_DEPARTMENT_OTHER): Payer: BC Managed Care – PPO | Admitting: Lab

## 2011-11-23 ENCOUNTER — Encounter (HOSPITAL_COMMUNITY)
Admission: RE | Admit: 2011-11-23 | Discharge: 2011-11-23 | Disposition: A | Discharge status: Home / Self Care | Payer: BC Managed Care – PPO | Source: Ambulatory Visit | Attending: Hematology & Oncology | Admitting: Hematology & Oncology

## 2011-11-23 ENCOUNTER — Ambulatory Visit (HOSPITAL_BASED_OUTPATIENT_CLINIC_OR_DEPARTMENT_OTHER): Payer: BC Managed Care – PPO | Admitting: Hematology & Oncology

## 2011-11-23 VITALS — BP 127/68 | HR 84 | Temp 97.0°F | Ht 71.0 in | Wt 165.0 lb

## 2011-11-23 DIAGNOSIS — D649 Anemia, unspecified: Secondary | ICD-10-CM

## 2011-11-23 DIAGNOSIS — D7581 Myelofibrosis: Secondary | ICD-10-CM

## 2011-11-23 DIAGNOSIS — D474 Osteomyelofibrosis: Secondary | ICD-10-CM

## 2011-11-23 LAB — CHCC SATELLITE - SMEAR

## 2011-11-23 LAB — RETICULOCYTES (CHCC)
ABS Retic: 98.8 10*3/uL (ref 19.0–186.0)
RBC.: 2.6 MIL/uL — ABNORMAL LOW (ref 4.22–5.81)
Retic Ct Pct: 3.8 % — ABNORMAL HIGH (ref 0.4–2.3)

## 2011-11-23 LAB — CBC WITH DIFFERENTIAL (CANCER CENTER ONLY)
BASO#: 0 10*3/uL (ref 0.0–0.2)
BASO%: 0.4 % (ref 0.0–2.0)
EOS%: 0.3 % (ref 0.0–7.0)
HGB: 7.1 g/dL — ABNORMAL LOW (ref 13.0–17.1)
LYMPH#: 0.8 10*3/uL — ABNORMAL LOW (ref 0.9–3.3)
MCHC: 29.1 g/dL — ABNORMAL LOW (ref 32.0–35.9)
MONO%: 6.8 % (ref 0.0–13.0)
NEUT#: 5.7 10*3/uL (ref 1.5–6.5)
Platelets: 224 10*3/uL (ref 145–400)
RBC: 2.5 10*6/uL — ABNORMAL LOW (ref 4.20–5.70)

## 2011-11-23 LAB — TECHNOLOGIST REVIEW CHCC SATELLITE

## 2011-11-23 LAB — HOLD TUBE, BLOOD BANK - CHCC SATELLITE

## 2011-11-23 NOTE — Progress Notes (Signed)
CC:   Brian Bowen, M.D. Brian Harms, MD  DIAGNOSES: 1. Myelofibrosis. 2. 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  CURRENT THERAPY:  Patient to start Jakafi 25 mg p.o. b.i.d.  INTERIM HISTORY:  Brian Bowen comes in for followup.  He is starting to feel more of the effects of the myelofibrosis and his anemia.  Of note, he is JAK2 positive.  I think we are going to have to get him started on Jakafi.  He is trying to work.  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  He has had no bleeding.  He has had no fever.  He has had no bony pain. There may be some bony achiness.  His appetite is okay.  He may get full a little bit more quickly.  He has had no problems with bowel or bladder.  There have been no rashes.  PHYSICAL EXAMINATION:  This is a well-developed, well-nourished white gentleman in no obvious distress.  Vital signs:  97, pulse 84, respiratory rate 18, blood pressure 127/68, weight 165.  Head/Neck:  A normocephalic, atraumatic skull.  There are no ocular or oral lesions. There are no palpable cervical or supraclavicular lymph nodes.  Lungs: Clear bilaterally.  Cardiac:  Regular rate and rhythm with a normal S1, S2.  He has a 1/6 systolic ejection murmur.  Abdomen:  Soft.  The abdomen is slightly distended.  There is no palpable hepatomegaly.  His spleen tip is down close to the umbilicus.  Extremities:  No cyanosis, clubbing or edema.  Skin:  No rashes.  Neurologic:  No focal neurologic deficits.  LABORATORY DATA:  White cell count is 7.1, hemoglobin 7.1, hematocrit 24.4, platelet count 224.  IMPRESSION:  Brian Bowen is a 61 year old gentleman with myelofibrosis. Again, he is JAK2 positive.  We have been following him now for 4 years.  He definitely is showing symptoms from his disease.  As such, we are going to need to get him started on therapy.  I do believe Brian Bowen is appropriate for him.  I will start him off at 25 mg b.i.d.  I do  think he needs to be transfused.  Brian Bowen will worsen his anemia.  I want to make sure that we improve his hemoglobin before we start the [facility].  Will get him transfused next week.  I will plan to have him come back in about 3 weeks time.  Will have to get information for him on Jakafi.    ______________________________ Josph Macho, M.D. PRE/MEDQ  D:  11/23/2011  T:  11/23/2011  Job:  [id number]

## 2011-11-23 NOTE — Progress Notes (Signed)
This office note has been dictated.

## 2011-11-26 ENCOUNTER — Ambulatory Visit (HOSPITAL_BASED_OUTPATIENT_CLINIC_OR_DEPARTMENT_OTHER): Payer: BC Managed Care – PPO

## 2011-11-26 VITALS — BP 120/66 | HR 74 | Temp 98.4°F | Resp 20

## 2011-11-26 DIAGNOSIS — D7581 Myelofibrosis: Secondary | ICD-10-CM

## 2011-11-26 DIAGNOSIS — D649 Anemia, unspecified: Secondary | ICD-10-CM

## 2011-11-26 LAB — PREPARE RBC (CROSSMATCH)

## 2011-11-26 MED ORDER — FUROSEMIDE 10 MG/ML IJ SOLN
20.0000 mg | Freq: Once | INTRAMUSCULAR | Status: AC
  Administered 2011-11-26: 20 mg via INTRAVENOUS

## 2011-11-26 MED ORDER — SODIUM CHLORIDE 0.9 % IV SOLN
250.0000 mL | Freq: Once | INTRAVENOUS | Status: AC
  Administered 2011-11-26: 250 mL via INTRAVENOUS

## 2011-11-26 MED ORDER — ACETAMINOPHEN 325 MG PO TABS
650.0000 mg | ORAL_TABLET | Freq: Once | ORAL | Status: AC
  Administered 2011-11-26: 650 mg via ORAL

## 2011-11-26 NOTE — Patient Instructions (Signed)
Blood Transfusion Information  WHAT IS A BLOOD TRANSFUSION?  A transfusion is the replacement of blood or some of its parts. Blood is made up of multiple cells which provide different functions.   Red blood cells carry oxygen and are used for blood loss replacement.   White blood cells fight against infection.   Platelets control bleeding.   Plasma helps clot blood.   Other blood products are available for specialized needs, such as hemophilia or other clotting disorders.  BEFORE THE TRANSFUSION   Who gives blood for transfusions?    You may be able to donate blood to be used at a later date on yourself (autologous donation).   Relatives can be asked to donate blood. This is generally not any safer than if you have received blood from a stranger. The same precautions are taken to ensure safety when a relative's blood is donated.   Healthy volunteers who are fully evaluated to make sure their blood is safe. This is blood bank blood.  Transfusion therapy is the safest it has ever been in the practice of medicine. Before blood is taken from a donor, a complete history is taken to make sure that person has no history of diseases nor engages in risky social behavior (examples are intravenous drug use or sexual activity with multiple partners). The donor's travel history is screened to minimize risk of transmitting infections, such as malaria. The donated blood is tested for signs of infectious diseases, such as HIV and hepatitis. The blood is then tested to be sure it is compatible with you in order to minimize the chance of a transfusion reaction. If you or a relative donates blood, this is often done in anticipation of surgery and is not appropriate for emergency situations. It takes many days to process the donated blood.  RISKS AND COMPLICATIONS  Although transfusion therapy is very safe and saves many lives, the main dangers of transfusion include:    Getting an infectious disease.   Developing a  transfusion reaction. This is an allergic reaction to something in the blood you were given. Every precaution is taken to prevent this.  The decision to have a blood transfusion has been considered carefully by your caregiver before blood is given. Blood is not given unless the benefits outweigh the risks.  AFTER THE TRANSFUSION   Right after receiving a blood transfusion, you will usually feel much better and more energetic. This is especially true if your red blood cells have gotten low (anemic). The transfusion raises the level of the red blood cells which carry oxygen, and this usually causes an energy increase.   The nurse administering the transfusion will monitor you carefully for complications.  HOME CARE INSTRUCTIONS   No special instructions are needed after a transfusion. You may find your energy is better. Speak with your caregiver about any limitations on activity for underlying diseases you may have.  SEEK MEDICAL CARE IF:    Your condition is not improving after your transfusion.   You develop redness or irritation at the intravenous (IV) site.  SEEK IMMEDIATE MEDICAL CARE IF:   Any of the following symptoms occur over the next 12 hours:   Shaking chills.   You have a temperature by mouth above 102 F (38.9 C), not controlled by medicine.   Chest, back, or muscle pain.   People around you feel you are not acting correctly or are confused.   Shortness of breath or difficulty breathing.   Dizziness and fainting.     You get a rash or develop hives.   You have a decrease in urine output.   Your urine turns a dark color or changes to pink, red, or brown.  Any of the following symptoms occur over the next 10 days:   You have a temperature by mouth above 102 F (38.9 C), not controlled by medicine.   Shortness of breath.   Weakness after normal activity.   The white part of the eye turns yellow (jaundice).   You have a decrease in the amount of urine or are urinating less often.   Your  urine turns a dark color or changes to pink, red, or brown.  Document Released: 07/13/2000 Document Revised: 07/05/2011 Document Reviewed: 03/01/2008  Exit[employer] Patient Information 2012 [employer].

## 2011-11-27 LAB — TYPE AND SCREEN: Unit division: 0

## 2011-11-29 ENCOUNTER — Encounter: Payer: Self-pay | Admitting: Hematology & Oncology

## 2011-11-30 ENCOUNTER — Encounter (HOSPITAL_COMMUNITY)
Admission: RE | Admit: 2011-11-30 | Discharge: 2011-11-30 | Disposition: A | Discharge status: Home / Self Care | Payer: BC Managed Care – PPO | Source: Ambulatory Visit | Attending: Hematology & Oncology | Admitting: Hematology & Oncology

## 2011-11-30 DIAGNOSIS — D649 Anemia, unspecified: Secondary | ICD-10-CM | POA: Insufficient documentation

## 2011-11-30 DIAGNOSIS — D7581 Myelofibrosis: Secondary | ICD-10-CM | POA: Insufficient documentation

## 2011-12-03 ENCOUNTER — Other Ambulatory Visit: Payer: Self-pay | Admitting: *Deleted

## 2011-12-03 MED ORDER — RUXOLITINIB PHOSPHATE 25 MG PO TABS: 25.0000 mg | ORAL_TABLET | Freq: Two times a day (BID) | ORAL | Status: DC

## 2011-12-03 NOTE — Telephone Encounter (Signed)
Called local [facility] to see if they are able to obtain [facility] and they are not. Faxed rx to [facility] to be filled.

## 2011-12-05 ENCOUNTER — Telehealth: Payer: Self-pay | Admitting: Hematology & Oncology

## 2011-12-05 NOTE — Telephone Encounter (Signed)
Called and spoke to Brian Bowen.  Informed him that authorization was obtained for his [facility] prescription.  Also informed him that he should be hearing from the specialty pharmacy in the next 24 hrs.

## 2011-12-06 ENCOUNTER — Telehealth: Payer: Self-pay | Admitting: Hematology & Oncology

## 2011-12-06 NOTE — Telephone Encounter (Signed)
Faxed financial assistance application to [employer] on behalf of [name].

## 2011-12-13 ENCOUNTER — Other Ambulatory Visit (HOSPITAL_BASED_OUTPATIENT_CLINIC_OR_DEPARTMENT_OTHER): Payer: BC Managed Care – PPO | Admitting: Lab

## 2011-12-13 ENCOUNTER — Ambulatory Visit (HOSPITAL_BASED_OUTPATIENT_CLINIC_OR_DEPARTMENT_OTHER): Payer: BC Managed Care – PPO | Admitting: Hematology & Oncology

## 2011-12-13 VITALS — BP 130/62 | HR 87 | Temp 97.2°F | Ht 69.0 in | Wt 163.0 lb

## 2011-12-13 DIAGNOSIS — D474 Osteomyelofibrosis: Secondary | ICD-10-CM

## 2011-12-13 DIAGNOSIS — D7581 Myelofibrosis: Secondary | ICD-10-CM

## 2011-12-13 DIAGNOSIS — D649 Anemia, unspecified: Secondary | ICD-10-CM

## 2011-12-13 LAB — CBC WITH DIFFERENTIAL (CANCER CENTER ONLY)
BASO#: 0 10*3/uL (ref 0.0–0.2)
Eosinophils Absolute: 0 10*3/uL (ref 0.0–0.5)
HCT: 24.1 % — ABNORMAL LOW (ref 38.7–49.9)
HGB: 7.4 g/dL — ABNORMAL LOW (ref 13.0–17.1)
LYMPH%: 10.1 % — ABNORMAL LOW (ref 14.0–48.0)
MCH: 29.2 pg (ref 28.0–33.4)
MCV: 95 fL (ref 82–98)
MONO#: 0.5 10*3/uL (ref 0.1–0.9)
Platelets: 249 10*3/uL (ref 145–400)
RBC: 2.53 10*6/uL — ABNORMAL LOW (ref 4.20–5.70)
WBC: 7 10*3/uL (ref 4.0–10.0)

## 2011-12-13 LAB — LACTATE DEHYDROGENASE: LDH: 223 U/L (ref 94–250)

## 2011-12-29 ENCOUNTER — Encounter (HOSPITAL_COMMUNITY)
Admission: RE | Admit: 2011-12-29 | Discharge: 2011-12-29 | Disposition: A | Discharge status: Home / Self Care | Payer: BC Managed Care – PPO | Source: Ambulatory Visit | Attending: Hematology & Oncology | Admitting: Hematology & Oncology

## 2011-12-29 DIAGNOSIS — D7581 Myelofibrosis: Secondary | ICD-10-CM | POA: Insufficient documentation

## 2011-12-29 DIAGNOSIS — D649 Anemia, unspecified: Secondary | ICD-10-CM | POA: Insufficient documentation

## 2012-01-28 NOTE — Progress Notes (Signed)
This office note has been dictated.

## 2012-01-29 ENCOUNTER — Encounter (HOSPITAL_COMMUNITY)
Admission: RE | Admit: 2012-01-29 | Discharge: 2012-01-29 | Disposition: A | Discharge status: Home / Self Care | Payer: BC Managed Care – PPO | Source: Ambulatory Visit | Attending: Hematology & Oncology | Admitting: Hematology & Oncology

## 2012-01-29 DIAGNOSIS — D471 Chronic myeloproliferative disease: Secondary | ICD-10-CM | POA: Insufficient documentation

## 2012-01-29 DIAGNOSIS — D474 Osteomyelofibrosis: Secondary | ICD-10-CM | POA: Insufficient documentation

## 2012-02-07 ENCOUNTER — Ambulatory Visit (HOSPITAL_BASED_OUTPATIENT_CLINIC_OR_DEPARTMENT_OTHER): Payer: BC Managed Care – PPO | Admitting: Lab

## 2012-02-07 ENCOUNTER — Ambulatory Visit (HOSPITAL_BASED_OUTPATIENT_CLINIC_OR_DEPARTMENT_OTHER): Payer: BC Managed Care – PPO | Admitting: Hematology & Oncology

## 2012-02-07 VITALS — BP 135/54 | HR 81 | Temp 97.6°F | Ht 69.0 in | Wt 173.0 lb

## 2012-02-07 DIAGNOSIS — D474 Osteomyelofibrosis: Secondary | ICD-10-CM

## 2012-02-07 DIAGNOSIS — D649 Anemia, unspecified: Secondary | ICD-10-CM

## 2012-02-07 DIAGNOSIS — D7581 Myelofibrosis: Secondary | ICD-10-CM

## 2012-02-07 LAB — CBC WITH DIFFERENTIAL (CANCER CENTER ONLY)
BASO#: 0 10*3/uL (ref 0.0–0.2)
BASO%: 0.4 % (ref 0.0–2.0)
EOS%: 0 % (ref 0.0–7.0)
HCT: 20.1 % — ABNORMAL LOW (ref 38.7–49.9)
LYMPH#: 0.9 10*3/uL (ref 0.9–3.3)
LYMPH%: 16.3 % (ref 14.0–48.0)
MCH: 29.5 pg (ref 28.0–33.4)
MCHC: 30.8 g/dL — ABNORMAL LOW (ref 32.0–35.9)
MCV: 96 fL (ref 82–98)
MONO%: 7.6 % (ref 0.0–13.0)
NEUT%: 75.7 % (ref 40.0–80.0)
RDW: 20.1 % — ABNORMAL HIGH (ref 11.1–15.7)

## 2012-02-07 NOTE — Progress Notes (Signed)
This office note has been dictated.

## 2012-02-08 ENCOUNTER — Telehealth: Payer: Self-pay | Admitting: Hematology & Oncology

## 2012-02-08 NOTE — Progress Notes (Signed)
CC:   Larina Earthly, M.D. Gorden Harms, MD, Fax [phone]  DIAGNOSIS:  Myelofibrosis.  CURRENT THERAPY:  Jakafi 25 mg p.o. b.i.d.  INTERIM HISTORY:  [name] comes in for his followup.  He says he has not felt this well in a long time.  He said that after starting to take the [facility], he started to feel better.  He started to feel better after a few days.  He says his sweats went away.  He said he began to eat better.  He said that he gained weight.  He said he had more energy.  He feels that his spleen really has gone down in size.  He has not had any diarrhea.  He says he does have some swelling in his legs.  This seems to get worse as the day goes by, but then after he goes to bed at night the leg swelling goes down.  He denies any arthralgias or myalgias.  He has had no double vision or blurred vision.  He has had no dysphasia or odynophagia.  He did have a little bit of a cough.  This is all gone now.  PHYSICAL EXAMINATION:  General:  This is a well-developed, well- nourished white gentleman in no obvious distress.  Vital signs: 97.6, pulse 81, respiratory rate 22, blood pressure 135/54.  Weight is 173. Head and neck:  Normocephalic, atraumatic skull.  There are no ocular or oral lesions.  There are no palpable cervical or supraclavicular lymph nodes.  Lungs:  Clear bilaterally.  He has no rales, wheezes or rhonchi. Cardiac:  Regular rate and rhythm with a normal S1 and S2.  There are no murmurs, rubs or bruits.  Abdomen:  Soft with good bowel sounds.  There is no fluid wave.  There is no abdominal distention.  He has no abdominal mass.  There is no palpable hepatomegaly.  His spleen tip is now just below the left costal margin.  Extremities:  Shows some 1+ pitting edema in his legs bilaterally.  Skin:  No rashes, ecchymoses or petechia.  Neurologic: No focal neurological deficits.  LABORATORY STUDIES:  White cell count is 5.7, hemoglobin 6.2, hematocrit __________, platelet  count 197.  IMPRESSION:  Mr. Fukuda is a 61 year old gentleman with JAK2 positive myelofibrosis.  His response to [name] has been incredible.  His spleen has shrunk down significantly.  Again, before he started [facility], his spleen was down by the umbilicus.  Now, it is just below the left costal margin.  Unfortunately, he is quite anemic.  He is still asymptomatic with this.  I really would hate to transfuse him right now.  He is hemodynamically stable.  He is asymptomatic.  I think we are going to have to watch his blood counts weekly now.  We may have to adjust his dose of Jakafi.  Typically, for __________, the anemia does improve and settles to a new "baseline."  Maybe we can just see how Mr. Kennard anemia response.  Mr. Betzold is okay without having to be transfused.  Unfortunately, the rain has really hampered his crops.  We have actually had too much rain now.  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 Mr. Tolen also states that he will be coming up on his 1 day free supply of Jakafi.  Hopefully, we will somehow be able to make sure that he continues to receive is Jakafi.  Again, his response has been dramatic with marked reduction of his splenomegaly.  I will see Mr. Mccravy back myself in 3 weeks.    ______________________________ Josph Macho, M.D. PRE/MEDQ  D:  02/07/2012  T:  02/08/2012  Job:  [id number]

## 2012-02-08 NOTE — Telephone Encounter (Signed)
Pt aware of 7-18 and 25th labs at [facility]. Macie made the appointments

## 2012-02-14 ENCOUNTER — Other Ambulatory Visit: Payer: BC Managed Care – PPO | Admitting: Lab

## 2012-02-14 ENCOUNTER — Telehealth (HOSPITAL_COMMUNITY): Payer: Self-pay | Admitting: *Deleted

## 2012-02-14 ENCOUNTER — Encounter (HOSPITAL_BASED_OUTPATIENT_CLINIC_OR_DEPARTMENT_OTHER): Payer: BC Managed Care – PPO

## 2012-02-14 DIAGNOSIS — D474 Osteomyelofibrosis: Secondary | ICD-10-CM

## 2012-02-14 DIAGNOSIS — D649 Anemia, unspecified: Secondary | ICD-10-CM

## 2012-02-14 DIAGNOSIS — D7581 Myelofibrosis: Secondary | ICD-10-CM

## 2012-02-14 LAB — CBC
MCH: 28.9 pg (ref 26.0–34.0)
MCV: 92.4 fL (ref 78.0–100.0)
Platelets: 214 10*3/uL (ref 150–400)
RBC: 2.25 MIL/uL — ABNORMAL LOW (ref 4.22–5.81)
RDW: 20.9 % — ABNORMAL HIGH (ref 11.5–15.5)
WBC: 4 10*3/uL (ref 4.0–10.5)

## 2012-02-14 LAB — RETICULOCYTES
RBC.: 2.25 MIL/uL — ABNORMAL LOW (ref 4.22–5.81)
Retic Ct Pct: 2.5 % (ref 0.4–3.1)

## 2012-02-14 LAB — DIFFERENTIAL
Basophils Absolute: 0 10*3/uL (ref 0.0–0.1)
Eosinophils Relative: 0 % (ref 0–5)
Lymphs Abs: 0.8 10*3/uL (ref 0.7–4.0)
Monocytes Absolute: 0.3 10*3/uL (ref 0.1–1.0)
Neutrophils Relative %: 72 % (ref 43–77)

## 2012-02-14 NOTE — Progress Notes (Signed)
CRITICAL VALUE ALERT  Critical value received:  H&H  Date of notification:  02/14/12  Time of notification:  1330  Critical value read back:yes  Nurse who received alert:  Rosana Berger RN  MD notified (1st page):  Critical read to [facility] @ Dr. Gustavo Lah office at [facility]. She said that she would show this to Dr. Myna Hidalgo and they would take care of it @ 1332.

## 2012-02-14 NOTE — Progress Notes (Signed)
Labs drawn today for cbc/diff,retic 

## 2012-02-14 NOTE — Telephone Encounter (Signed)
Critical lab (h&H0h) read to [facility] at Dr. Gustavo Lah office. She said she would give it to Dr. Myna Hidalgo and they would take care of it.

## 2012-02-21 ENCOUNTER — Encounter (HOSPITAL_BASED_OUTPATIENT_CLINIC_OR_DEPARTMENT_OTHER): Payer: BC Managed Care – PPO

## 2012-02-21 ENCOUNTER — Other Ambulatory Visit: Payer: BC Managed Care – PPO | Admitting: Lab

## 2012-02-21 DIAGNOSIS — D649 Anemia, unspecified: Secondary | ICD-10-CM

## 2012-02-21 DIAGNOSIS — D7581 Myelofibrosis: Secondary | ICD-10-CM

## 2012-02-21 LAB — CBC
MCHC: 31.8 g/dL (ref 30.0–36.0)
Platelets: 224 10*3/uL (ref 150–400)
RDW: 21 % — ABNORMAL HIGH (ref 11.5–15.5)
WBC: 5.5 10*3/uL (ref 4.0–10.5)

## 2012-02-21 LAB — DIFFERENTIAL
Eosinophils Absolute: 0 10*3/uL (ref 0.0–0.7)
Lymphs Abs: 0.7 10*3/uL (ref 0.7–4.0)
Monocytes Absolute: 0.4 10*3/uL (ref 0.1–1.0)
Monocytes Relative: 7 % (ref 3–12)
Neutrophils Relative %: 80 % — ABNORMAL HIGH (ref 43–77)

## 2012-02-21 NOTE — Progress Notes (Signed)
Labs drawn today for cbc/diff 

## 2012-02-22 ENCOUNTER — Telehealth: Payer: Self-pay | Admitting: Oncology

## 2012-02-22 NOTE — Telephone Encounter (Addendum)
Message copied by Lacie Draft on Fri Feb 22, 2012  4:01 PM ------      Message from: Ellouise Newer III      Created: Thu Feb 21, 2012  4:16 PM       Labs 02/22/2012 4:14 PM Patient notified of Hgb increasing to 7.1. Teola Bradley, Shakeya Kerkman [employer]

## 2012-02-28 ENCOUNTER — Encounter (HOSPITAL_COMMUNITY)
Admission: RE | Admit: 2012-02-28 | Discharge: 2012-02-28 | Disposition: A | Discharge status: Home / Self Care | Payer: BC Managed Care – PPO | Source: Ambulatory Visit | Attending: Hematology & Oncology | Admitting: Hematology & Oncology

## 2012-02-28 ENCOUNTER — Other Ambulatory Visit (HOSPITAL_BASED_OUTPATIENT_CLINIC_OR_DEPARTMENT_OTHER): Payer: BC Managed Care – PPO | Admitting: Lab

## 2012-02-28 ENCOUNTER — Ambulatory Visit (HOSPITAL_BASED_OUTPATIENT_CLINIC_OR_DEPARTMENT_OTHER): Payer: BC Managed Care – PPO | Admitting: Hematology & Oncology

## 2012-02-28 VITALS — BP 145/68 | HR 84 | Temp 97.6°F | Ht 69.0 in | Wt 178.0 lb

## 2012-02-28 DIAGNOSIS — D7581 Myelofibrosis: Secondary | ICD-10-CM

## 2012-02-28 DIAGNOSIS — D649 Anemia, unspecified: Secondary | ICD-10-CM

## 2012-02-28 DIAGNOSIS — D471 Chronic myeloproliferative disease: Secondary | ICD-10-CM | POA: Insufficient documentation

## 2012-02-28 DIAGNOSIS — D474 Osteomyelofibrosis: Secondary | ICD-10-CM

## 2012-02-28 LAB — CBC WITH DIFFERENTIAL (CANCER CENTER ONLY)
BASO#: 0 10*3/uL (ref 0.0–0.2)
EOS%: 0.2 % (ref 0.0–7.0)
Eosinophils Absolute: 0 10*3/uL (ref 0.0–0.5)
HGB: 6.6 g/dL — CL (ref 13.0–17.1)
LYMPH%: 9.3 % — ABNORMAL LOW (ref 14.0–48.0)
MCH: 30.1 pg (ref 28.0–33.4)
MCHC: 30.8 g/dL — ABNORMAL LOW (ref 32.0–35.9)
MCV: 98 fL (ref 82–98)
MONO%: 6.3 % (ref 0.0–13.0)
Platelets: 192 10*3/uL (ref 145–400)
RBC: 2.19 10*6/uL — ABNORMAL LOW (ref 4.20–5.70)

## 2012-02-28 LAB — COMPREHENSIVE METABOLIC PANEL
Alkaline Phosphatase: 84 U/L (ref 39–117)
BUN: 15 mg/dL (ref 6–23)
Glucose, Bld: 115 mg/dL — ABNORMAL HIGH (ref 70–99)
Total Bilirubin: 0.7 mg/dL (ref 0.3–1.2)

## 2012-02-28 NOTE — Progress Notes (Signed)
This office note has been dictated.

## 2012-02-29 NOTE — Progress Notes (Signed)
CC:   Brian Bowen, M.D. Brian Harms, MD  DIAGNOSIS:  Myelofibrosis, JAK2 positive.  CURRENT THERAPY:  Jakafi 25 mg p.o. b.i.d.  INTERIM HISTORY:  Brian Bowen comes in for followup.  He continues to feel well.  He has had no swelling.  He is eating well.  He says he is eating more than he has in a long time.  There has been no abdominal pain.  There has been no change in bowel or bladder habits.  He has had no bony pain.  There have been no fevers. He has had no bleeding.  He has had no headache.  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  PHYSICAL EXAMINATION:  General:  This is a well developed, well- nourished white gentleman in no obvious distress.  Vital Signs: Temperature 97.6, pulse 84, respiratory rate 18, blood pressure 145/68, weight is 178.  Head and Neck Exam:  Shows a normocephalic, atraumatic skull.  There are no ocular or oral lesions.  There are no palpable cervical or supraclavicular lymph nodes.  Lungs:  Clear bilaterally. Cardiac Exam:  Regular rate and rhythm with a normal S1 and S2.  There are no murmurs, rubs, or bruits.  Abdominal Exam:  Soft with good bowel sounds.  There is no fluid wave.  There is no abdominal distention. There is no palpable hepatomegaly.  His spleen tip may be palpable at the left costal margin.  Extremities:  Show no clubbing, cyanosis, or edema.  He has good range motion of his joints.  Neurological Exam:  No focal neurological deficits.  Skin:  No rashes, ecchymosis, or petechia.  LABORATORY STUDIES:  White cell count is 5.7, hemoglobin 6.6, hematocrit 21.4, platelet count 192.  MCV is 98.  IMPRESSION:  Brian Bowen is a 61 year old gentleman with myelofibrosis. Again, he is JAK2 positive.  We will continue him on the current dose of Jakafi.  His hemoglobin has stabilized.  He is totally asymptomatic.  We will plan to get him back now in about a month or so.  I do not think we need any blood work in between visits unless he does  complain of some symptoms.  I am just glad to see that his systemic symptoms resolved and that his quality of life has improved.   ______________________________ Josph Macho, M.D. PRE/MEDQ  D:  02/28/2012  T:  02/29/2012  Job:  [id number]

## 2012-03-27 ENCOUNTER — Other Ambulatory Visit: Payer: Self-pay | Admitting: *Deleted

## 2012-03-27 ENCOUNTER — Ambulatory Visit: Payer: BC Managed Care – PPO | Admitting: Hematology & Oncology

## 2012-03-27 ENCOUNTER — Other Ambulatory Visit (HOSPITAL_BASED_OUTPATIENT_CLINIC_OR_DEPARTMENT_OTHER): Payer: BC Managed Care – PPO | Admitting: Lab

## 2012-03-27 VITALS — BP 146/55 | HR 77 | Temp 98.3°F | Resp 20 | Ht 69.0 in | Wt 180.0 lb

## 2012-03-27 DIAGNOSIS — D7581 Myelofibrosis: Secondary | ICD-10-CM

## 2012-03-27 DIAGNOSIS — D474 Osteomyelofibrosis: Secondary | ICD-10-CM

## 2012-03-27 DIAGNOSIS — D649 Anemia, unspecified: Secondary | ICD-10-CM

## 2012-03-27 LAB — CHCC SATELLITE - SMEAR

## 2012-03-27 LAB — CBC WITH DIFFERENTIAL (CANCER CENTER ONLY)
BASO#: 0 10*3/uL (ref 0.0–0.2)
Eosinophils Absolute: 0 10*3/uL (ref 0.0–0.5)
HCT: 21.2 % — ABNORMAL LOW (ref 38.7–49.9)
HGB: 6.6 g/dL — CL (ref 13.0–17.1)
LYMPH%: 5.1 % — ABNORMAL LOW (ref 14.0–48.0)
MCV: 97 fL (ref 82–98)
MONO#: 0.3 10*3/uL (ref 0.1–0.9)
NEUT%: 89.6 % — ABNORMAL HIGH (ref 40.0–80.0)
RBC: 2.19 10*6/uL — ABNORMAL LOW (ref 4.20–5.70)
WBC: 5.8 10*3/uL (ref 4.0–10.0)

## 2012-03-27 MED ORDER — RUXOLITINIB PHOSPHATE 10 MG PO TABS: 10.0000 mg | ORAL_TABLET | Freq: Two times a day (BID) | ORAL | Status: DC

## 2012-03-27 NOTE — Patient Instructions (Signed)
Stop the [facility] for one week.  We will re-order a lower dose at 10 mg twice a day. Start that in one week.  Call us if you begin to get short-winded, bleed, pain, diarrhea.

## 2012-03-27 NOTE — Progress Notes (Signed)
This office note has been dictated.

## 2012-03-28 NOTE — Progress Notes (Signed)
CC:   Larina Earthly, M.D. Gorden Harms, MD  DIAGNOSIS:  Myelofibrosis, JAK2 positive.  CURRENT THERAPY:  Patient to restart Jakafi at 10 mg p.o. b.i.d.  INTERIM HISTORY:  Mr. Iannello comes in for his followup.  He is still feeling well.  He has really had no problems with the [facility].  He, unfortunately, has had quite a bit of anemia with it.  Thankfully, we have not had to transfuse him as he has not been symptomatic.  He has had no abdominal pain.  There are no arthralgias or myalgias.  He has had no fever.  He has had no change in bowel or bladder habits.  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  He has had no rashes.  He has had no nausea.  He has had no swallowing difficulties.  PHYSICAL EXAMINATION:  This is a well-developed, well-nourished white gentleman in no obvious distress.  Vital signs:  Temperature of 98.3, pulse 77, respiratory rate 20, blood pressure 146/55.  Weight is 180. Head and neck:  Normocephalic, atraumatic skull.  There are no ocular or oral lesions.  There are no palpable cervical or supraclavicular lymph nodes.  Lungs:  Clear bilaterally.  Cardiac:  Regular rate and rhythm with a normal S1 and S2.  There are no murmurs, rubs or bruits. Abdomen:  Soft with good bowel sounds.  There is no palpable abdominal mass.  There is no palpable hepatosplenomegaly.  His spleen tip really is palpable only at the left costal margin.  Extremities:  Some 1+ edema in his lower legs.  Skin:  No rashes.  Neurologic:  No focal neurological deficits.  LABORATORY STUDIES:  White cell count is 5.8, hemoglobin 6.6, hematocrit 21.2, platelet count 155.  IMPRESSION:  Ms. Feldner is a 61 year old gentleman with myelofibrosis. He had progressive cytopenias secondary to splenomegaly.  He has had a very nice response to [facility].  Unfortunately, his anemia has been a real problem.  As such, we are going to cut the Jakafi dose back.  I  told Mr. Roser to hold the [facility] for a week.  We will then get him on 10 mg twice a day and possibly try to titrate him back up.  We will get him back to see [place] in a month.   ______________________________ Josph Macho, M.D. PRE/MEDQ  D:  03/27/2012  T:  03/28/2012  Job:  [id number]

## 2012-04-01 ENCOUNTER — Encounter (HOSPITAL_COMMUNITY)
Admission: RE | Admit: 2012-04-01 | Discharge: 2012-04-01 | Disposition: A | Discharge status: Home / Self Care | Payer: BC Managed Care – PPO | Source: Ambulatory Visit | Attending: Hematology & Oncology | Admitting: Hematology & Oncology

## 2012-04-01 DIAGNOSIS — D471 Chronic myeloproliferative disease: Secondary | ICD-10-CM | POA: Insufficient documentation

## 2012-04-01 DIAGNOSIS — D474 Osteomyelofibrosis: Secondary | ICD-10-CM | POA: Insufficient documentation

## 2012-04-02 ENCOUNTER — Other Ambulatory Visit: Payer: Self-pay | Admitting: *Deleted

## 2012-04-24 ENCOUNTER — Other Ambulatory Visit (HOSPITAL_BASED_OUTPATIENT_CLINIC_OR_DEPARTMENT_OTHER): Payer: BC Managed Care – PPO | Admitting: Lab

## 2012-04-24 ENCOUNTER — Ambulatory Visit (HOSPITAL_BASED_OUTPATIENT_CLINIC_OR_DEPARTMENT_OTHER): Payer: BC Managed Care – PPO | Admitting: Hematology & Oncology

## 2012-04-24 VITALS — BP 138/58 | HR 70 | Temp 98.3°F | Resp 18 | Ht 69.0 in | Wt 177.0 lb

## 2012-04-24 DIAGNOSIS — D7581 Myelofibrosis: Secondary | ICD-10-CM

## 2012-04-24 DIAGNOSIS — D474 Osteomyelofibrosis: Secondary | ICD-10-CM

## 2012-04-24 DIAGNOSIS — D649 Anemia, unspecified: Secondary | ICD-10-CM

## 2012-04-24 LAB — COMPREHENSIVE METABOLIC PANEL
ALT: 12 U/L (ref 0–53)
AST: 15 U/L (ref 0–37)
Albumin: 4.8 g/dL (ref 3.5–5.2)
CO2: 27 mEq/L (ref 19–32)
Calcium: 9 mg/dL (ref 8.4–10.5)
Chloride: 106 mEq/L (ref 96–112)
Creatinine, Ser: 1.06 mg/dL (ref 0.50–1.35)
Potassium: 4.3 mEq/L (ref 3.5–5.3)
Total Protein: 6.7 g/dL (ref 6.0–8.3)

## 2012-04-24 LAB — LACTATE DEHYDROGENASE: LDH: 174 U/L (ref 94–250)

## 2012-04-24 LAB — CBC WITH DIFFERENTIAL (CANCER CENTER ONLY)
BASO#: 0 10*3/uL (ref 0.0–0.2)
BASO%: 0.4 % (ref 0.0–2.0)
EOS%: 0.2 % (ref 0.0–7.0)
HCT: 23.7 % — ABNORMAL LOW (ref 38.7–49.9)
HGB: 7.6 g/dL — ABNORMAL LOW (ref 13.0–17.1)
LYMPH#: 0.6 10*3/uL — ABNORMAL LOW (ref 0.9–3.3)
MCH: 31.1 pg (ref 28.0–33.4)
MCHC: 32.1 g/dL (ref 32.0–35.9)
MONO%: 5.1 % (ref 0.0–13.0)
NEUT#: 3.7 10*3/uL (ref 1.5–6.5)
NEUT%: 81.9 % — ABNORMAL HIGH (ref 40.0–80.0)
RDW: 19.5 % — ABNORMAL HIGH (ref 11.1–15.7)

## 2012-04-24 LAB — IRON AND TIBC
%SAT: 38 % (ref 20–55)
Iron: 108 ug/dL (ref 42–165)
TIBC: 283 ug/dL (ref 215–435)
UIBC: 175 ug/dL (ref 125–400)

## 2012-04-24 LAB — FERRITIN: Ferritin: 482 ng/mL — ABNORMAL HIGH (ref 22–322)

## 2012-04-24 NOTE — Progress Notes (Signed)
This office note has been dictated.

## 2012-04-24 NOTE — Patient Instructions (Signed)
Call if problems 

## 2012-04-25 ENCOUNTER — Encounter: Payer: Self-pay | Admitting: *Deleted

## 2012-04-25 NOTE — Progress Notes (Signed)
CC:   Larina Earthly, M.D. Gorden Harms, MD  DIAGNOSIS:  Myelofibrosis., JAK2 positive.  CURRENT THERAPY:  Jakafi 10 mg p.o. b.i.d.  INTERIM HISTORY:  Mr. Suma comes in for his followup.  He is doing well on the 10 mg dose.  He says that he does have some pruritus.  He had this before he started [place].  He says this only bothers him really if he takes a shower.  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  This is the harvesting season, and it is going to be a good crop.  He really has no fatigue.  His appetite has been good.  He has had no abdominal pain.  He has had no change in bowel or bladder habits.  He has had no cough or shortness of breath.  PHYSICAL EXAMINATION:  This is a well-developed, well-nourished white gentleman in no obvious distress.  Vital signs:  98.3, pulse 70, respiratory rate 18, blood pressure 138/58.  Weight is 177.  Head and neck:  Normocephalic, atraumatic skull.  There are no ocular or oral lesions.  There are no palpable cervical or supraclavicular lymph nodes. Lungs:  Clear bilaterally.  Cardiac:  Regular rate and rhythm with a normal S1 and S2.  There are no murmurs, rubs or bruits.  Abdomen:  Soft with good bowel sounds.  There is no fluid wave.  There is no guarding or rebound tenderness.  There is no abdominal mass.  There is no palpable hepatomegaly.  His spleen tip is just below the left costal margin.  Extremities:  No clubbing, cyanosis or edema.  Neurologic:  No focal neurological deficits.  Skin:  No rashes, ecchymosis or petechia.  LABORATORY STUDIES:  White cell count is 4.5, hemoglobin 7.6, hematocrit 23.7, platelet count 214.  IMPRESSION:  Mr. Nunn is a 61 year old gentleman with myelofibrosis. We initially had him on the 25 mg b.i.d. dose.  His hemoglobin took a real hit with this.  Now, we are hopefully starting to  see his hemoglobin recovering a little bit.  Despite the anemia, Mr. Bertolet has never had symptoms attributable to the anemia.  We will plan to get him back in about 6 weeks now.  Hopefully, we will continue to see his hemoglobin improving.  We may have a chance to gradually increase his dose of Jakafi depending on his blood counts.    ______________________________ Josph Macho, M.D. PRE/MEDQ  D:  04/24/2012  T:  04/25/2012  Job:  [id number]

## 2012-04-25 NOTE — Progress Notes (Signed)
DIAGNOSIS:  Myelofibrosis, JAK2 positive.  CURRENT THERAPY:  Jakafi 25 mg p.o. b.i.d.  INTERIM HISTORY:  [name] comes in for his followup.  He says he really feels better.  He has no abdominal pain.  The pruritus is gone. He has no bleeding.  There is no problem with bowels or bladder.  He is still working getting planting in.  The Jakafi was no problem to obtain.  He has had no rashes.  He has had no headache.  There is no dysphagia or odynophagia.  PHYSICAL EXAMINATION:  General:  This is a well-developed, well- nourished white gentleman in no obvious distress.  Vital Signs: Temperature 97.2, respiratory rate 18, blood pressure 130/62, weight 163.  Head and Neck Exam:  Shows a normocephalic, atraumatic skull.  He has no ocular or oral lesions.  There are no palpable cervical or supraclavicular lymph nodes.  Lungs:  Clear bilaterally.  Cardiac Exam: Regular rate and rhythm with a normal S1 and S2.  There are no murmurs, rubs, or bruits.  Abdominal Exam:  Soft with good bowel sounds.  There is no palpable abdominal mass.  There is no palpable hepatomegaly. Spleen tip is only about 3-4 cm below the left costal margin. Extremities:  Show no clubbing, cyanosis, or edema.  He may have a little bit of pitting edema, however.  Skin Exam:  No rashes. Neurological Exam:  No focal neurological deficits.  LABORATORY STUDIES:  Show a white cell count of 7, hemoglobin 7.4, hematocrit 24.1, platelet count 249.  IMPRESSION:  [name] is a 61 year old gentleman with myelofibrosis. Again, he is JAK2 positive.  We started him on Jakafi back in April.  He is already responding.  His spleen is less.  His systemic symptoms have resolved.  He has lost a little weight, but hopefully he will be able to gain this back.  The anemia is no surprise.  We will have to watch this closely.  We may have to make a dosage adjustment with the Jakafi if his anemia worsens.  Mostly, I am pleased that his  quality of life continues to improve.  We will see him back in another couple of months.    ______________________________ Brian Bowen, M.D. PRE/MEDQ  D:  04/25/2012  T:  04/25/2012  Job:  [id number]

## 2012-04-29 ENCOUNTER — Encounter (HOSPITAL_COMMUNITY)
Admission: RE | Admit: 2012-04-29 | Discharge: 2012-04-29 | Disposition: A | Discharge status: Home / Self Care | Payer: BC Managed Care – PPO | Source: Ambulatory Visit | Attending: Hematology & Oncology | Admitting: Hematology & Oncology

## 2012-04-29 DIAGNOSIS — D474 Osteomyelofibrosis: Secondary | ICD-10-CM | POA: Insufficient documentation

## 2012-04-29 DIAGNOSIS — D471 Chronic myeloproliferative disease: Secondary | ICD-10-CM | POA: Insufficient documentation

## 2012-05-29 ENCOUNTER — Telehealth: Payer: Self-pay | Admitting: *Deleted

## 2012-05-29 ENCOUNTER — Other Ambulatory Visit: Payer: Self-pay | Admitting: *Deleted

## 2012-05-29 DIAGNOSIS — D474 Osteomyelofibrosis: Secondary | ICD-10-CM

## 2012-05-29 NOTE — Telephone Encounter (Signed)
Pt aware of 11-4 lab

## 2012-05-29 NOTE — Telephone Encounter (Signed)
Pt called to report a tooth abscess that is currently being treated with PenV-K but will need to be removed on 06/04/12. He was asked by the dentist to call Dr Myna Hidalgo to see if he needed to take any precautions while also taking Jakafi. Reviewed with Dr Myna Hidalgo & on-site pharmacist. No drug interactions noted. To check CBC on Monday 11/4 to determine if he needs any platelets. Will contact Dr. Thea Gist (oral surgeon) in [place], [place] on Monday with the results at [phone].

## 2012-06-02 ENCOUNTER — Encounter (HOSPITAL_COMMUNITY)
Admission: RE | Admit: 2012-06-02 | Discharge: 2012-06-02 | Disposition: A | Discharge status: Home / Self Care | Payer: BC Managed Care – PPO | Source: Ambulatory Visit | Attending: Hematology & Oncology | Admitting: Hematology & Oncology

## 2012-06-02 ENCOUNTER — Other Ambulatory Visit (HOSPITAL_BASED_OUTPATIENT_CLINIC_OR_DEPARTMENT_OTHER): Payer: BC Managed Care – PPO | Admitting: Lab

## 2012-06-02 DIAGNOSIS — D474 Osteomyelofibrosis: Secondary | ICD-10-CM

## 2012-06-02 DIAGNOSIS — D471 Chronic myeloproliferative disease: Secondary | ICD-10-CM | POA: Insufficient documentation

## 2012-06-02 LAB — CBC WITH DIFFERENTIAL (CANCER CENTER ONLY)
BASO%: 0.6 % (ref 0.0–2.0)
EOS%: 0.1 % (ref 0.0–7.0)
Eosinophils Absolute: 0 10*3/uL (ref 0.0–0.5)
LYMPH%: 12.4 % — ABNORMAL LOW (ref 14.0–48.0)
MCH: 31 pg (ref 28.0–33.4)
MCHC: 31.4 g/dL — ABNORMAL LOW (ref 32.0–35.9)
MCV: 99 fL — ABNORMAL HIGH (ref 82–98)
MONO%: 7.6 % (ref 0.0–13.0)
NEUT#: 5.4 10*3/uL (ref 1.5–6.5)
Platelets: 245 10*3/uL (ref 145–400)
RBC: 2.58 10*6/uL — ABNORMAL LOW (ref 4.20–5.70)
RDW: 19.4 % — ABNORMAL HIGH (ref 11.1–15.7)

## 2012-06-02 LAB — TECHNOLOGIST REVIEW CHCC SATELLITE

## 2012-06-05 ENCOUNTER — Ambulatory Visit: Payer: BC Managed Care – PPO | Admitting: Hematology & Oncology

## 2012-06-05 ENCOUNTER — Other Ambulatory Visit: Payer: BC Managed Care – PPO | Admitting: Lab

## 2012-06-11 ENCOUNTER — Ambulatory Visit (HOSPITAL_BASED_OUTPATIENT_CLINIC_OR_DEPARTMENT_OTHER): Payer: BC Managed Care – PPO | Admitting: Hematology & Oncology

## 2012-06-11 ENCOUNTER — Other Ambulatory Visit (HOSPITAL_BASED_OUTPATIENT_CLINIC_OR_DEPARTMENT_OTHER): Payer: BC Managed Care – PPO | Admitting: Lab

## 2012-06-11 VITALS — BP 155/69 | HR 89 | Temp 97.9°F | Resp 18 | Ht 69.0 in | Wt 180.0 lb

## 2012-06-11 DIAGNOSIS — D474 Osteomyelofibrosis: Secondary | ICD-10-CM

## 2012-06-11 DIAGNOSIS — R161 Splenomegaly, not elsewhere classified: Secondary | ICD-10-CM

## 2012-06-11 DIAGNOSIS — D7581 Myelofibrosis: Secondary | ICD-10-CM

## 2012-06-11 DIAGNOSIS — D649 Anemia, unspecified: Secondary | ICD-10-CM

## 2012-06-11 LAB — LACTATE DEHYDROGENASE: LDH: 180 U/L (ref 94–250)

## 2012-06-11 LAB — COMPREHENSIVE METABOLIC PANEL
ALT: 15 U/L (ref 0–53)
Alkaline Phosphatase: 79 U/L (ref 39–117)
Potassium: 4.8 mEq/L (ref 3.5–5.3)
Sodium: 142 mEq/L (ref 135–145)
Total Bilirubin: 0.9 mg/dL (ref 0.3–1.2)
Total Protein: 6.8 g/dL (ref 6.0–8.3)

## 2012-06-11 LAB — CBC WITH DIFFERENTIAL (CANCER CENTER ONLY)
BASO%: 0.7 % (ref 0.0–2.0)
EOS%: 0.2 % (ref 0.0–7.0)
HCT: 25.7 % — ABNORMAL LOW (ref 38.7–49.9)
LYMPH#: 0.7 10*3/uL — ABNORMAL LOW (ref 0.9–3.3)
LYMPH%: 16.1 % (ref 14.0–48.0)
MCHC: 31.1 g/dL — ABNORMAL LOW (ref 32.0–35.9)
MONO#: 0.4 10*3/uL (ref 0.1–0.9)
NEUT%: 74.9 % (ref 40.0–80.0)
Platelets: 189 10*3/uL (ref 145–400)
RDW: 19.1 % — ABNORMAL HIGH (ref 11.1–15.7)
WBC: 4.5 10*3/uL (ref 4.0–10.0)

## 2012-06-11 NOTE — Progress Notes (Signed)
This office note has been dictated.

## 2012-06-12 NOTE — Progress Notes (Signed)
CC:   Linna Darner, MD, Fax [phone] Gorden Harms, MD  DIAGNOSIS:  Myelofibrosis-JAK2 positive.  CURRENT THERAPY:  Jakafi 10 mg p.o. b.i.d.  INTERIM HISTORY:  Brian Bowen comes in for his followup.  He is really doing well.  He has done well with the 10 mg dosing of Jakafi.  He is still working.  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  He has had no sweats with chills.  He has had no problems sleeping.  He has had no diarrhea.  He has had no leg swelling.  There have been no rashes.  He has had no cough or shortness of breath.  Overall, his performance status is ECOG 0.  PHYSICAL EXAMINATION:  General:  This is a well-developed, well- nourished white gentleman in no obvious distress.  Vital signs: Temperature of 97.9, pulse 89, respiratory rate 18, blood pressure 155/69.  Weight is 180.  Head and neck:  Normocephalic, atraumatic skull.  There are no ocular or oral lesions.  There are no palpable cervical or supraclavicular lymph nodes.  Lungs:  Clear bilaterally. Cardiac:  Regular rate and rhythm with a normal S1 and S2.  There are no murmurs, rubs, or bruits.  Abdomen:  Soft with good bowel sounds.  There is no palpable abdominal mass.  There is no fluid wave.  There is no palpable hepatomegaly.  His spleen tip is about 3 cm below the left costal margin.  Extremities:  No clubbing, cyanosis, or edema.  He has good range of motion of his joints.  Neurological:  No focal neurological deficits.  LABORATORY STUDIES:  White cell count is 4.5, hemoglobin 8, hematocrit 25.7, and platelet count 189.  IMPRESSION:  Brian Bowen is a 61 year old gentleman with myelofibrosis. He is on [place].  This has controlled his symptoms quite nicely.  His splenomegaly has improved markedly.  For now, will plan to get him back in about 2 months or so.  I think we can get him through the holidays.  I do not think he needs any blood work in between office  visits.    ______________________________ Josph Macho, M.D. PRE/MEDQ  D:  06/11/2012  T:  06/12/2012  Job:  [id number]

## 2012-06-27 ENCOUNTER — Other Ambulatory Visit: Payer: Self-pay | Admitting: *Deleted

## 2012-06-27 DIAGNOSIS — D474 Osteomyelofibrosis: Secondary | ICD-10-CM

## 2012-06-27 MED ORDER — RUXOLITINIB PHOSPHATE 10 MG PO TABS: 10.0000 mg | ORAL_TABLET | Freq: Two times a day (BID) | ORAL | Status: DC

## 2012-06-27 NOTE — Telephone Encounter (Signed)
Received refill request from [facility] for Jakafi. Verified rx from last office note from Dr Ennever. His current dose is 10 mg BID. Will route to Dr Ennever for approval & signature then fax back to [phone]. 

## 2012-07-10 ENCOUNTER — Encounter (HOSPITAL_COMMUNITY)
Admission: RE | Admit: 2012-07-10 | Discharge: 2012-07-10 | Disposition: A | Discharge status: Home / Self Care | Payer: BC Managed Care – PPO | Source: Ambulatory Visit | Attending: Hematology & Oncology | Admitting: Hematology & Oncology

## 2012-07-10 DIAGNOSIS — D474 Osteomyelofibrosis: Secondary | ICD-10-CM | POA: Insufficient documentation

## 2012-07-10 DIAGNOSIS — D471 Chronic myeloproliferative disease: Secondary | ICD-10-CM | POA: Insufficient documentation

## 2012-08-05 ENCOUNTER — Encounter (HOSPITAL_COMMUNITY)
Admission: RE | Admit: 2012-08-05 | Discharge: 2012-08-05 | Disposition: A | Discharge status: Home / Self Care | Payer: BC Managed Care – PPO | Source: Ambulatory Visit | Attending: Hematology & Oncology | Admitting: Hematology & Oncology

## 2012-08-05 DIAGNOSIS — D474 Osteomyelofibrosis: Secondary | ICD-10-CM | POA: Insufficient documentation

## 2012-08-05 DIAGNOSIS — D471 Chronic myeloproliferative disease: Secondary | ICD-10-CM | POA: Insufficient documentation

## 2012-08-13 ENCOUNTER — Other Ambulatory Visit (HOSPITAL_BASED_OUTPATIENT_CLINIC_OR_DEPARTMENT_OTHER): Payer: BC Managed Care – PPO | Admitting: Lab

## 2012-08-13 ENCOUNTER — Ambulatory Visit (HOSPITAL_BASED_OUTPATIENT_CLINIC_OR_DEPARTMENT_OTHER): Payer: BC Managed Care – PPO | Admitting: Hematology & Oncology

## 2012-08-13 VITALS — BP 166/61 | HR 81 | Temp 99.1°F | Resp 18 | Ht 69.0 in | Wt 182.0 lb

## 2012-08-13 DIAGNOSIS — D474 Osteomyelofibrosis: Secondary | ICD-10-CM

## 2012-08-13 DIAGNOSIS — D7581 Myelofibrosis: Secondary | ICD-10-CM

## 2012-08-13 DIAGNOSIS — D649 Anemia, unspecified: Secondary | ICD-10-CM

## 2012-08-13 LAB — CBC WITH DIFFERENTIAL (CANCER CENTER ONLY)
BASO%: 0.8 % (ref 0.0–2.0)
EOS%: 0.3 % (ref 0.0–7.0)
HCT: 21.7 % — ABNORMAL LOW (ref 38.7–49.9)
LYMPH%: 13.6 % — ABNORMAL LOW (ref 14.0–48.0)
MCH: 31.3 pg (ref 28.0–33.4)
MCHC: 32.3 g/dL (ref 32.0–35.9)
MCV: 97 fL (ref 82–98)
MONO#: 0.3 10*3/uL (ref 0.1–0.9)
MONO%: 7.8 % (ref 0.0–13.0)
NEUT%: 77.5 % (ref 40.0–80.0)
RDW: 18.9 % — ABNORMAL HIGH (ref 11.1–15.7)

## 2012-08-13 NOTE — Progress Notes (Signed)
This office note has been dictated.

## 2012-08-14 NOTE — Progress Notes (Signed)
CC:   Linna Darner, MD, Fax [phone] Gorden Harms, MD  DIAGNOSIS:  Myelofibrosis, JAK2 positive.  CURRENT THERAPY:  Jakafi 10 mg p.o. b.i.d.  INTERIM HISTORY:  Mr. Demetriou comes in for followup.  We saw him 2 months ago.  Since then he has been doing okay.  He has been working without any difficulties.  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  He has had no sweats or chills.  He has had no problems with weight loss.  His appetite has been quite good.  He has had no change in bowel or bladder habits.  There has been no cough.  He has had no headache. He denies any kind of arthralgias or myalgias.  He did complain of an episode that lasted 40 minutes a couple of weeks ago.  He had palpitations.  He had severe indigestion.  I suspect that he may have had reflux that could have activated his vagus nerve.  He said that after burping 7 times, he felt better and his heart stopped palpitating.  Currently, his performance status is ECOG 0.  PHYSICAL EXAMINATION:  General:  This is a well-developed, well- nourished white gentleman in no obvious distress.  Vital signs: Temperature of 99.1, pulse 81, respiratory rate 18, blood pressure 166/61.  Weight is 182.  Head and neck:  Normocephalic, atraumatic skull.  There are no ocular or oral lesions.  There are no palpable cervical or supraclavicular lymph nodes.  Lungs:  Clear bilaterally. Cardiac:  Regular rate and rhythm, with a normal S1 and S2.  There are no murmurs, rubs or bruits.  Abdominal:  Soft abdomen with good bowel sounds.  There is no palpable abdominal mass.  There is no fluid wave. There is no palpable hepatomegaly.  His spleen tip is about 3 cm below his left costal margin.  Back:  No tenderness over the spine, ribs, or hips.  Extremities:  No clubbing, cyanosis or edema.  Skin:  No rashes, ecchymosis or petechia.  Neurological:  Shows no focal neurological deficits.  LABORATORY STUDIES:  White cell count is 4.0,  hemoglobin 7, hematocrit 21.7, platelet count 169,000.  MCV is 97.  Peripheral smear shows no nucleated red blood cells.  He has numerous teardrop cells.  He has stomatocytes.  I see no target cells.  There is no rouleaux formation.  There are a few myelocytes.  I see no blasts. He has some hypersegmented polys.  Platelets are adequate in number and size.  IMPRESSION:  Mr. Goynes is a 62 year old gentleman with myelofibrosis. He is on [place].  This has really controlled his splenomegaly. Unfortunately, he has the anemia.  He is asymptomatic with his anemia.  We last transfused him, I think, back in August.  I still think that we need to continue him on the Jakafi.  He has accommodated the anemia quite well.  We will go ahead and plan to get him back in another 6 to 8 weeks.  I told him to call us if he starts to have problems with shortness of breath, fatigue, poor appetite.    ______________________________ Josph Macho, M.D. PRE/MEDQ  D:  08/13/2012  T:  08/14/2012  Job:  [id number]

## 2012-09-02 ENCOUNTER — Encounter (HOSPITAL_COMMUNITY)
Admission: RE | Admit: 2012-09-02 | Discharge: 2012-09-02 | Disposition: A | Discharge status: Home / Self Care | Payer: BC Managed Care – PPO | Source: Ambulatory Visit | Attending: Hematology & Oncology | Admitting: Hematology & Oncology

## 2012-09-02 ENCOUNTER — Other Ambulatory Visit: Payer: Self-pay | Admitting: *Deleted

## 2012-09-02 DIAGNOSIS — D471 Chronic myeloproliferative disease: Secondary | ICD-10-CM | POA: Insufficient documentation

## 2012-09-02 DIAGNOSIS — D474 Osteomyelofibrosis: Secondary | ICD-10-CM

## 2012-09-02 NOTE — Telephone Encounter (Signed)
Received refill request from [facility] for [place]. Verified rx from last office note from Dr Myna Hidalgo. His current dose is 10 mg BID. Will route to Dr Myna Hidalgo for approval & signature then fax back to [phone].

## 2012-09-03 MED ORDER — RUXOLITINIB PHOSPHATE 10 MG PO TABS: 10.0000 mg | ORAL_TABLET | Freq: Two times a day (BID) | ORAL | Status: DC

## 2012-09-05 MED ORDER — RUXOLITINIB PHOSPHATE 10 MG PO TABS
10.0000 mg | ORAL_TABLET | Freq: Two times a day (BID) | ORAL | Status: DC
Start: 2012-09-05 — End: 2012-10-24

## 2012-09-05 NOTE — Addendum Note (Signed)
Addended by: Wynonia Hazard on: 09/05/2012 12:39 PM   Modules accepted: Orders

## 2012-09-24 ENCOUNTER — Telehealth: Payer: Self-pay | Admitting: Hematology & Oncology

## 2012-09-24 NOTE — Telephone Encounter (Signed)
Pt aware 3-14 moved to 3-21

## 2012-10-03 ENCOUNTER — Ambulatory Visit: Payer: BC Managed Care – PPO | Admitting: Medical

## 2012-10-03 ENCOUNTER — Other Ambulatory Visit: Payer: BC Managed Care – PPO | Admitting: Lab

## 2012-10-10 ENCOUNTER — Ambulatory Visit: Payer: BC Managed Care – PPO | Admitting: Internal Medicine

## 2012-10-10 ENCOUNTER — Other Ambulatory Visit: Payer: BC Managed Care – PPO | Admitting: Lab

## 2012-10-17 ENCOUNTER — Telehealth: Payer: Self-pay | Admitting: Hematology & Oncology

## 2012-10-17 ENCOUNTER — Other Ambulatory Visit: Payer: BC Managed Care – PPO | Admitting: Lab

## 2012-10-17 ENCOUNTER — Ambulatory Visit: Payer: BC Managed Care – PPO | Admitting: Medical

## 2012-10-17 NOTE — Telephone Encounter (Signed)
Pt moved 3-21 to 3-28 he is sick

## 2012-10-24 ENCOUNTER — Ambulatory Visit (HOSPITAL_BASED_OUTPATIENT_CLINIC_OR_DEPARTMENT_OTHER): Payer: BC Managed Care – PPO | Admitting: Medical

## 2012-10-24 ENCOUNTER — Other Ambulatory Visit (HOSPITAL_BASED_OUTPATIENT_CLINIC_OR_DEPARTMENT_OTHER): Payer: BC Managed Care – PPO | Admitting: Lab

## 2012-10-24 VITALS — BP 135/59 | HR 85 | Temp 98.4°F | Resp 18 | Ht 69.0 in | Wt 181.0 lb

## 2012-10-24 DIAGNOSIS — D7581 Myelofibrosis: Secondary | ICD-10-CM

## 2012-10-24 DIAGNOSIS — D649 Anemia, unspecified: Secondary | ICD-10-CM

## 2012-10-24 DIAGNOSIS — D474 Osteomyelofibrosis: Secondary | ICD-10-CM

## 2012-10-24 LAB — HOLD TUBE, BLOOD BANK - CHCC SATELLITE

## 2012-10-24 LAB — COMPREHENSIVE METABOLIC PANEL
ALT: 18 U/L (ref 0–53)
AST: 18 U/L (ref 0–37)
Alkaline Phosphatase: 72 U/L (ref 39–117)
CO2: 20 mEq/L (ref 19–32)
Creatinine, Ser: 1.35 mg/dL (ref 0.50–1.35)
Sodium: 142 mEq/L (ref 135–145)
Total Bilirubin: 0.6 mg/dL (ref 0.3–1.2)
Total Protein: 6.5 g/dL (ref 6.0–8.3)

## 2012-10-24 LAB — CBC WITH DIFFERENTIAL (CANCER CENTER ONLY)
BASO#: 0 10*3/uL (ref 0.0–0.2)
EOS%: 0.3 % (ref 0.0–7.0)
Eosinophils Absolute: 0 10*3/uL (ref 0.0–0.5)
HCT: 18.1 % — ABNORMAL LOW (ref 38.7–49.9)
HGB: 5.7 g/dL — CL (ref 13.0–17.1)
LYMPH#: 0.6 10*3/uL — ABNORMAL LOW (ref 0.9–3.3)
MCH: 31.1 pg (ref 28.0–33.4)
MCHC: 31.5 g/dL — ABNORMAL LOW (ref 32.0–35.9)
MONO%: 8.4 % (ref 0.0–13.0)
NEUT#: 2.6 10*3/uL (ref 1.5–6.5)
NEUT%: 73.4 % (ref 40.0–80.0)
RBC: 1.83 10*6/uL — ABNORMAL LOW (ref 4.20–5.70)

## 2012-10-24 LAB — CHCC SATELLITE - SMEAR

## 2012-10-24 NOTE — Addendum Note (Signed)
Addended by: Cathi Roan on: 10/24/2012 03:41 PM   Modules accepted: Orders

## 2012-10-24 NOTE — Progress Notes (Signed)
DIAGNOSIS:  Myelofibrosis, JAK2 positive.  CURRENT THERAPY:  Jakafi 10 mg p.o. b.i.d.  INTERIM HISTORY:  [name] presents today for an office followup visit.  Overall, he, reports, that he's doing quite well.  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  Unfortunately, his hemoglobin is quite low.  It is 5.7.  We have had to continue to cut the dose of his Jakafi back secondary to the, anemia.  This is the lowest.  He has been.  He does report some dizziness when he stands up.  He has on occasion heard.  His heart beat in his ears.  He, otherwise, reports, that he's not having any excessive fatigue or weakness.  He is not reporting any palpitations, or racing of his heart.  He is able to carry out, a normal day.  This is not affecting his activities of daily living.  I did discuss giving him 2 units of packed red blood cells, however, he would like to hold off for now.  He states he will call us if his symptoms should progress or if he has new symptoms.  He, otherwise, reports, a good appetite.  He denies any nausea, vomiting, diarrhea, constipation, any chest pain, shortness of breath, or cough.  He denies any fevers, chills, or night sweats.  He denies any type of abdominal pain.  He denies any lower leg swelling.  He denies any obvious, or abnormal bleeding.  He denies any headaches, visual changes, or rashes.  Overall, he has responded nicely to the [facility].  His spleen has significantly decreased, in size since we first saw him.  Review of Systems: Constitutional:Negative for malaise/fatigue, fever, chills, weight loss, diaphoresis, activity change, appetite change, and unexpected weight change.  HEENT: Negative for double vision, blurred vision, visual loss, ear pain, tinnitus, congestion, rhinorrhea, epistaxis sore throat or sinus disease, oral pain/lesion, tongue soreness Respiratory: Negative for cough, chest tightness, shortness of breath, wheezing and stridor.  Cardiovascular:  Negative for chest pain, palpitations, leg swelling, orthopnea, PND, DOE or claudication Gastrointestinal: Negative for nausea, vomiting, abdominal pain, diarrhea, constipation, blood in stool, melena, hematochezia, abdominal distention, anal bleeding, rectal pain, anorexia and hematemesis.  Genitourinary: Negative for dysuria, frequency, hematuria,  Musculoskeletal: Negative for myalgias, back pain, joint swelling, arthralgias and gait problem.  Skin: Negative for rash, color change, pallor and wound.  Neurological:. Negative for dizziness/light-headedness, tremors, seizures, syncope, facial asymmetry, speech difficulty, weakness, numbness, headaches and paresthesias.  Hematological: Negative for adenopathy. Does not bruise/bleed easily.  Psychiatric/Behavioral:  Negative for depression, no loss of interest in normal activity or change in sleep pattern.   Physical Exam: Vitals: Temperature 98.4 degrees, pulse 85, respirations 18, blood pressure 135/59, weight 181 pounds HEENT reveals a normocephalic, atraumatic skull, no scleral icterus, no oral lesions  Neck is supple without any cervical or supraclavicular adenopathy.  Lungs are clear to auscultation bilaterally. There are no wheezes, rales or rhonci Cardiac is regular rate and rhythm with a normal S1 and S2. There are no murmurs, rubs, or bruits.  Abdomen is soft with good bowel sounds, there is no palpable mass. There is no palpable hepatosplenomegaly. There is no palpable fluid wave.  Musculoskeletal no tenderness of the spine, ribs, or hips.  Extremities there are no clubbing, cyanosis, or edema.  Skin no petechia, purpura or ecchymosis Neurologic is nonfocal.  Laboratory Data:  White count 3.6, hemoglobin 5.7, hematocrit 18.1, MCV 99, platelets 182,000  Current Outpatient Prescriptions on File Prior to Visit  Medication  Sig Dispense Refill  . allopurinol (ZYLOPRIM) 100 MG tablet Take 100 mg by mouth daily.        Marland Kitchen aspirin 81 MG  tablet Take 81 mg by mouth daily after breakfast.       . omeprazole (PRILOSEC) 20 MG capsule Take 20 mg by mouth daily.        . ruxolitinib phosphate (JAKAFI) 10 MG tablet Take 1 tablet (10 mg total) by mouth 2 (two) times daily.  60 tablet  5  . ruxolitinib phosphate (JAKAFI) 25 MG tablet Take 1 tablet (25 mg total) by mouth 2 (two) times daily.  60 tablet  1   No current facility-administered medications on file prior to visit.   Assessment/Plan: This is a 62 year old, gentleman, with the following issues:  #1.  Myelofibrosis.  He remains on Jakafi.  This has really controlled his splenomegaly.  Unfortunately, he is taking a hit with the anemia.  I think for the time being, we're been have to take him off the [facility].   #2.  Anemia.  This is most likely secondary to the [facility].  I did advise [name] to go ahead and receive 2 units of packed red blood cells, however, he wishes to hold off on that.  I did advise him that if he should start having an increase in dizziness, shortness of breath, weakness,/fatigue, any type of racing of the heart, or palpitations, to give [place] a call immediately.  #3.  Followup.  We will follow back up with [name] in one month, but before then should there be questions or concerns.

## 2012-10-28 ENCOUNTER — Encounter (HOSPITAL_COMMUNITY)
Admission: RE | Admit: 2012-10-28 | Discharge: 2012-10-28 | Disposition: A | Discharge status: Home / Self Care | Payer: BC Managed Care – PPO | Source: Ambulatory Visit | Attending: Hematology & Oncology | Admitting: Hematology & Oncology

## 2012-10-28 DIAGNOSIS — D630 Anemia in neoplastic disease: Secondary | ICD-10-CM | POA: Insufficient documentation

## 2012-10-28 DIAGNOSIS — D471 Chronic myeloproliferative disease: Secondary | ICD-10-CM | POA: Insufficient documentation

## 2012-10-28 DIAGNOSIS — D474 Osteomyelofibrosis: Secondary | ICD-10-CM | POA: Insufficient documentation

## 2012-11-07 ENCOUNTER — Telehealth: Payer: Self-pay | Admitting: Hematology & Oncology

## 2012-11-07 ENCOUNTER — Telehealth: Payer: Self-pay | Admitting: *Deleted

## 2012-11-07 ENCOUNTER — Other Ambulatory Visit: Payer: Self-pay | Admitting: *Deleted

## 2012-11-07 DIAGNOSIS — D630 Anemia in neoplastic disease: Secondary | ICD-10-CM

## 2012-11-07 DIAGNOSIS — D474 Osteomyelofibrosis: Secondary | ICD-10-CM

## 2012-11-07 NOTE — Telephone Encounter (Signed)
Patient called and spoke with RN.  Per Rn to sch lab on 11/10/12 and Blood on 11/11/12.  Patient is aware of both apts

## 2012-11-07 NOTE — Telephone Encounter (Signed)
Pt called stating that "ever since I stopped the Jakafi I have felt worse. I can hear my heartbeat in my ears; get dizzy when I bend over; have to take breaks more often; sometimes get short winded; feel like my feet are on fire; and get chills sometimes at night". Explained to the pt that his hast hgb was very low 5.7 and was probably lower now since it was 2 weeks ago. He denies chest pain or heart palpitations. Gave him several options to receive blood but he didn't want it until Tuesday 4/15. He will come in for type and cross match on 11/10/12. Brian Bowen did agree to go to the ER if his sx worsen over the weekend or if he developed chest pain, increasing SOB, heart palpitations, or lightheadedness. 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 Explained to him that Dr Myna Hidalgo did not think his symptoms were related to him being off the [facility].

## 2012-11-10 ENCOUNTER — Other Ambulatory Visit (HOSPITAL_BASED_OUTPATIENT_CLINIC_OR_DEPARTMENT_OTHER): Payer: BC Managed Care – PPO | Admitting: Lab

## 2012-11-10 DIAGNOSIS — D7581 Myelofibrosis: Secondary | ICD-10-CM

## 2012-11-10 DIAGNOSIS — D649 Anemia, unspecified: Secondary | ICD-10-CM

## 2012-11-10 DIAGNOSIS — D474 Osteomyelofibrosis: Secondary | ICD-10-CM

## 2012-11-10 DIAGNOSIS — D630 Anemia in neoplastic disease: Secondary | ICD-10-CM

## 2012-11-10 LAB — CBC WITH DIFFERENTIAL (CANCER CENTER ONLY)
BASO#: 0 10*3/uL (ref 0.0–0.2)
HCT: 16.4 % — ABNORMAL LOW (ref 38.7–49.9)
HGB: 5 g/dL — CL (ref 13.0–17.1)
LYMPH#: 0.6 10*3/uL — ABNORMAL LOW (ref 0.9–3.3)
MONO#: 0.4 10*3/uL (ref 0.1–0.9)
NEUT%: 79.9 % (ref 40.0–80.0)
WBC: 4.7 10*3/uL (ref 4.0–10.0)

## 2012-11-10 LAB — TECHNOLOGIST REVIEW CHCC SATELLITE

## 2012-11-11 ENCOUNTER — Ambulatory Visit (HOSPITAL_BASED_OUTPATIENT_CLINIC_OR_DEPARTMENT_OTHER): Payer: BC Managed Care – PPO

## 2012-11-11 VITALS — BP 111/56 | HR 79 | Temp 99.2°F | Resp 20

## 2012-11-11 DIAGNOSIS — D649 Anemia, unspecified: Secondary | ICD-10-CM

## 2012-11-11 DIAGNOSIS — D7581 Myelofibrosis: Secondary | ICD-10-CM

## 2012-11-11 DIAGNOSIS — D630 Anemia in neoplastic disease: Secondary | ICD-10-CM

## 2012-11-11 DIAGNOSIS — D474 Osteomyelofibrosis: Secondary | ICD-10-CM

## 2012-11-11 MED ORDER — SODIUM CHLORIDE 0.9 % IV SOLN
250.0000 mL | Freq: Once | INTRAVENOUS | Status: AC
  Administered 2012-11-11: 250 mL via INTRAVENOUS

## 2012-11-11 MED ORDER — ACETAMINOPHEN 325 MG PO TABS
650.0000 mg | ORAL_TABLET | Freq: Once | ORAL | Status: AC
  Administered 2012-11-11: 650 mg via ORAL

## 2012-11-11 MED ORDER — DIPHENHYDRAMINE HCL 25 MG PO CAPS
25.0000 mg | ORAL_CAPSULE | Freq: Once | ORAL | Status: AC
  Administered 2012-11-11: 25 mg via ORAL

## 2012-11-11 NOTE — Patient Instructions (Signed)
Blood Transfusion   A blood transfusion replaces your blood or some of its parts. Blood is replaced when you have lost blood because of surgery, an accident, or for severe blood conditions like anemia.  You can donate blood to be used on yourself if you have a planned surgery. If you lose blood during that surgery, your own blood can be given back to you.  Any blood given to you is checked to make sure it matches your blood type. Your temperature, blood pressure, and heart rate (vital signs) will be checked often.   GET HELP RIGHT AWAY IF:    You feel sick to your stomach (nauseous) or throw up (vomit).   You have watery poop (diarrhea).   You have shortness of breath or trouble breathing.   You have blood in your pee (urine) or have dark colored pee.   You have chest pain or tightness.   Your eyes or skin turn yellow (jaundice).   You have a temperature by mouth above 102 F (38.9 C), not controlled by medicine.   You start to shake and have chills.   You develop a a red rash (hives) or feel itchy.   You develop lightheadedness or feel confused.   You develop back, joint, or muscle pain.   You do not feel hungry (lost appetite).   You feel tired, restless, or nervous.   You develop belly (abdominal) cramps.  Document Released: 10/12/2008 Document Revised: 10/08/2011 Document Reviewed: 10/12/2008  ExitCare Patient Information 2013 [employer].

## 2012-11-12 ENCOUNTER — Encounter: Payer: Self-pay | Admitting: Hematology & Oncology

## 2012-11-12 LAB — TYPE AND SCREEN
Antibody Screen: NEGATIVE
Unit division: 0

## 2012-11-24 ENCOUNTER — Other Ambulatory Visit: Payer: BC Managed Care – PPO | Admitting: Lab

## 2012-11-24 ENCOUNTER — Ambulatory Visit: Payer: BC Managed Care – PPO | Admitting: Hematology & Oncology

## 2012-11-25 ENCOUNTER — Ambulatory Visit (HOSPITAL_BASED_OUTPATIENT_CLINIC_OR_DEPARTMENT_OTHER): Payer: BC Managed Care – PPO | Admitting: Hematology & Oncology

## 2012-11-25 ENCOUNTER — Other Ambulatory Visit (HOSPITAL_BASED_OUTPATIENT_CLINIC_OR_DEPARTMENT_OTHER): Payer: BC Managed Care – PPO | Admitting: Lab

## 2012-11-25 VITALS — BP 138/58 | HR 89 | Temp 98.5°F | Resp 18 | Ht 69.0 in | Wt 175.0 lb

## 2012-11-25 DIAGNOSIS — D474 Osteomyelofibrosis: Secondary | ICD-10-CM

## 2012-11-25 DIAGNOSIS — D7581 Myelofibrosis: Secondary | ICD-10-CM

## 2012-11-25 DIAGNOSIS — D649 Anemia, unspecified: Secondary | ICD-10-CM

## 2012-11-25 LAB — CBC WITH DIFFERENTIAL (CANCER CENTER ONLY)
BASO%: 0.2 % (ref 0.0–2.0)
Eosinophils Absolute: 0 10*3/uL (ref 0.0–0.5)
LYMPH%: 13.2 % — ABNORMAL LOW (ref 14.0–48.0)
MCV: 100 fL — ABNORMAL HIGH (ref 82–98)
MONO#: 0.5 10*3/uL (ref 0.1–0.9)
NEUT#: 3.9 10*3/uL (ref 1.5–6.5)
Platelets: 204 10*3/uL (ref 145–400)
RBC: 2.45 10*6/uL — ABNORMAL LOW (ref 4.20–5.70)
RDW: 19.9 % — ABNORMAL HIGH (ref 11.1–15.7)
WBC: 5 10*3/uL (ref 4.0–10.0)

## 2012-11-25 LAB — TECHNOLOGIST REVIEW CHCC SATELLITE

## 2012-11-25 NOTE — Progress Notes (Signed)
This office note has been dictated.

## 2012-11-27 ENCOUNTER — Encounter (HOSPITAL_COMMUNITY)
Admission: RE | Admit: 2012-11-27 | Discharge: 2012-11-27 | Disposition: A | Discharge status: Home / Self Care | Payer: BC Managed Care – PPO | Source: Ambulatory Visit | Attending: Hematology & Oncology | Admitting: Hematology & Oncology

## 2012-11-27 DIAGNOSIS — D474 Osteomyelofibrosis: Secondary | ICD-10-CM | POA: Insufficient documentation

## 2012-11-27 DIAGNOSIS — D471 Chronic myeloproliferative disease: Secondary | ICD-10-CM | POA: Insufficient documentation

## 2012-11-27 NOTE — Progress Notes (Signed)
CC:   Brian Darner, MD, Fax [phone]  DIAGNOSIS:  Myelofibrosis-JAK2 positive.  CURRENT THERAPY:  Patient to restart Jakafi at 5 mg p.o. b.i.d.  INTERIM HISTORY:  Brian Bowen has been on Jakafi now for 1 month.  He is starting to feel more symptoms.  He is having kidney stones.  His spleen is starting to enlarge, he says.  He gets full a little bit more early. He is having some arthralgias.  He had to be transfused, I think a couple of weeks ago, with his hemoglobin down to 5.  He has tolerated Jakafi incredibly well outside of the anemia.  This has really been a huge problem for him.  He does tolerate anemia quite well.  He has had anemia for quite a while, so he has compensated for this.  He has had some kidney stones that he has passed.  He feels that this is part of being off Jakafi.  He has had no cough.  He has had some shortness of breath.  Again, the blood transfusion helped this.  PHYSICAL EXAMINATION:  General:  This is a well-developed, well- nourished white gentleman in no obvious distress.  Vital signs: Temperature of 98.5, pulse 89, respiratory rate 18, blood pressure 138/58, weight is 175.  Head and neck exam:  Shows a normocephalic, atraumatic skull.  There are no ocular or oral lesions.  There are no palpable cervical or supraclavicular lymph nodes.  Lungs:  Are clear bilaterally.  Cardiac exam:  Regular rate and rhythm with a normal S1, S2.  There are no murmurs, rubs or bruits.  Abdominal exam:  Soft, with good bowel sounds.  There is no palpable abdominal mass.  There is no fluid wave.  His spleen tip is about 7 cm or so below the left costal margin.  Extremities:  Shows some trace edema in his legs.  Skin exam: Shows no rashes, ecchymoses or petechia.  LABORATORY STUDIES:  White cell count is 5, hemoglobin 7.5, hematocrit 24.5, platelet count 204.  IMPRESSION:  Brian Bowen is a 62 year old gentleman with myelofibrosis. He responded well to [facility]   His systemic symptoms and the splenomegaly resolved quite nicely.  Unfortunately, the anemia has been a huge problem for him.  We will get him back on the Jakafi at 5 mg b.i.d.  He is willing to try this.  I think it is worth while to try to help with his symptoms.  We will go ahead and get him back in 1 month.  Hopefully, we will find that his anemia does not worsen.    ______________________________ Josph Macho, M.D. PRE/MEDQ  D:  11/25/2012  T:  11/26/2012  Job:  [id number]

## 2012-12-05 ENCOUNTER — Ambulatory Visit: Payer: BC Managed Care – PPO | Admitting: Hematology & Oncology

## 2012-12-05 ENCOUNTER — Other Ambulatory Visit: Payer: BC Managed Care – PPO | Admitting: Lab

## 2012-12-23 ENCOUNTER — Ambulatory Visit: Payer: BC Managed Care – PPO | Admitting: Hematology & Oncology

## 2012-12-23 ENCOUNTER — Other Ambulatory Visit: Payer: BC Managed Care – PPO | Admitting: Lab

## 2012-12-25 ENCOUNTER — Other Ambulatory Visit: Payer: Self-pay | Admitting: *Deleted

## 2012-12-25 DIAGNOSIS — D474 Osteomyelofibrosis: Secondary | ICD-10-CM

## 2012-12-25 MED ORDER — RUXOLITINIB PHOSPHATE 10 MG PO TABS: 10.0000 mg | ORAL_TABLET | Freq: Two times a day (BID) | ORAL | Status: DC

## 2012-12-25 NOTE — Telephone Encounter (Signed)
Received refill request from [facility] for [place]. To maintain rx at [facility] 10 mg BID as they are making dose adjustments (per Dr Myna Hidalgo and the pt). Will route fax back to [phone] once rx is signed by Dr Myna Hidalgo.Brian Bowen

## 2012-12-30 ENCOUNTER — Encounter (HOSPITAL_COMMUNITY)
Admission: RE | Admit: 2012-12-30 | Discharge: 2012-12-30 | Disposition: A | Discharge status: Home / Self Care | Payer: BC Managed Care – PPO | Source: Ambulatory Visit | Attending: Hematology & Oncology | Admitting: Hematology & Oncology

## 2012-12-30 DIAGNOSIS — D471 Chronic myeloproliferative disease: Secondary | ICD-10-CM | POA: Insufficient documentation

## 2012-12-30 DIAGNOSIS — D474 Osteomyelofibrosis: Secondary | ICD-10-CM

## 2012-12-31 ENCOUNTER — Telehealth: Payer: Self-pay | Admitting: Hematology & Oncology

## 2012-12-31 NOTE — Telephone Encounter (Signed)
[employer] has APPROVED Jakafi for this patient.  Efc: 12/30/2012 through 12/30/2013  Ph: [phone]

## 2013-01-02 ENCOUNTER — Other Ambulatory Visit (HOSPITAL_BASED_OUTPATIENT_CLINIC_OR_DEPARTMENT_OTHER): Payer: BC Managed Care – PPO | Admitting: Lab

## 2013-01-02 ENCOUNTER — Ambulatory Visit (HOSPITAL_BASED_OUTPATIENT_CLINIC_OR_DEPARTMENT_OTHER): Payer: BC Managed Care – PPO | Admitting: Hematology & Oncology

## 2013-01-02 VITALS — BP 136/66 | HR 82 | Temp 98.3°F | Resp 18 | Ht 69.0 in | Wt 175.0 lb

## 2013-01-02 DIAGNOSIS — D7581 Myelofibrosis: Secondary | ICD-10-CM

## 2013-01-02 DIAGNOSIS — D474 Osteomyelofibrosis: Secondary | ICD-10-CM

## 2013-01-02 DIAGNOSIS — D649 Anemia, unspecified: Secondary | ICD-10-CM

## 2013-01-02 LAB — CBC WITH DIFFERENTIAL (CANCER CENTER ONLY)
EOS%: 0.2 % (ref 0.0–7.0)
Eosinophils Absolute: 0 10*3/uL (ref 0.0–0.5)
LYMPH#: 0.6 10*3/uL — ABNORMAL LOW (ref 0.9–3.3)
MCH: 30.7 pg (ref 28.0–33.4)
MCHC: 31.1 g/dL — ABNORMAL LOW (ref 32.0–35.9)
MONO%: 7.7 % (ref 0.0–13.0)
NEUT#: 3.3 10*3/uL (ref 1.5–6.5)
Platelets: 213 10*3/uL (ref 145–400)
RBC: 2.25 10*6/uL — ABNORMAL LOW (ref 4.20–5.70)

## 2013-01-02 LAB — COMPREHENSIVE METABOLIC PANEL
ALT: 18 U/L (ref 0–53)
AST: 14 U/L (ref 0–37)
Creatinine, Ser: 1.28 mg/dL (ref 0.50–1.35)
Sodium: 144 mEq/L (ref 135–145)
Total Bilirubin: 0.9 mg/dL (ref 0.3–1.2)

## 2013-01-02 LAB — CHCC SATELLITE - SMEAR

## 2013-01-02 LAB — HOLD TUBE, BLOOD BANK - CHCC SATELLITE

## 2013-01-02 LAB — LACTATE DEHYDROGENASE: LDH: 242 U/L (ref 94–250)

## 2013-01-02 NOTE — Progress Notes (Signed)
This office note has been dictated.

## 2013-01-05 NOTE — Progress Notes (Signed)
CC:   Brian Darner, MD, Fax [phone]  DIAGNOSIS:  Myelofibrosis, JAK2 positive.  CURRENT THERAPY:  Jakafi 5 mg p.o. b.i.d.  INTERIM HISTORY:  Brian Bowen comes in for followup.  He always does well with Jakafi with respect to his symptoms.  We had to get him back on Jakafi because he was having more systemic symptoms.  Once he started the [facility], his symptoms resolved.  Unfortunately, with [name] he becomes quite anemic.  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  He has had no itching.  He has had no change in bowel or bladder habits. He has had no leg swelling.  He has had no cough or shortness of breath. There has been no fever.  Overall, his performance status is ECOG 1.  PHYSICAL EXAMINATION:  General:  This is a well-developed, well- nourished white gentleman in no obvious distress.  Vital signs: Temperature of 98.3, pulse 83, respiratory rate 18, blood pressure 136/66.  Weight is 175.  Head and neck:  Normocephalic, atraumatic skull.  There are no ocular or oral lesions.  There are no palpable cervical or supraclavicular lymph nodes.  Lungs:  Clear bilaterally. Cardiac:  Regular rate and rhythm, with a normal S1 and S2.  There are no murmurs, rubs or bruits.  Abdomen:  Soft.  He has good bowel sounds. There is no fluid wave.  There is no guarding or rebound tenderness. There is no palpable hepatomegaly.  His spleen tip is about 2 cm below the left costal margin.  The spleen tip is below the lateral aspect of the left costal margin.  Back:  No tenderness over the spine, ribs, or hips.  Extremities:  Show no clubbing, cyanosis or edema.  Skin:  No rashes, ecchymoses or petechia.  LABORATORY STUDIES:  White cell count is 4.3, hemoglobin 6.9, hematocrit 22.2, platelet count 213,000.  MCV is 99.  IMPRESSION:  Brian Bowen is a very nice 62 year old gentleman with myelofibrosis.  Again, Jakafi mostly  improves his quality of life.  His systemic symptoms from the myelofibrosis go away with the [facility].  He definitely is willing to accept anemia for feeling a whole lot better.  We typically do not have to transfuse him unless his hemoglobin gets below 6 or so.  He always lets [place] know if he is feeling tired and needs to be transfused.  I want to get him back in about 6 weeks' time.  We will see how he is feeling.    ______________________________ Josph Macho, M.D. PRE/MEDQ  D:  01/02/2013  T:  01/03/2013  Job:  [id number]

## 2013-01-07 ENCOUNTER — Other Ambulatory Visit: Payer: Self-pay | Admitting: Hematology & Oncology

## 2013-01-07 DIAGNOSIS — D474 Osteomyelofibrosis: Secondary | ICD-10-CM

## 2013-01-09 ENCOUNTER — Ambulatory Visit (HOSPITAL_BASED_OUTPATIENT_CLINIC_OR_DEPARTMENT_OTHER): Payer: BC Managed Care – PPO

## 2013-01-09 ENCOUNTER — Other Ambulatory Visit (HOSPITAL_BASED_OUTPATIENT_CLINIC_OR_DEPARTMENT_OTHER): Payer: BC Managed Care – PPO | Admitting: Lab

## 2013-01-09 VITALS — BP 121/58 | HR 69 | Temp 98.6°F | Resp 20

## 2013-01-09 DIAGNOSIS — D474 Osteomyelofibrosis: Secondary | ICD-10-CM

## 2013-01-09 DIAGNOSIS — D649 Anemia, unspecified: Secondary | ICD-10-CM

## 2013-01-09 DIAGNOSIS — D7581 Myelofibrosis: Secondary | ICD-10-CM

## 2013-01-09 LAB — CBC WITH DIFFERENTIAL (CANCER CENTER ONLY)
BASO%: 0.8 % (ref 0.0–2.0)
EOS%: 0.3 % (ref 0.0–7.0)
HCT: 19.7 % — ABNORMAL LOW (ref 38.7–49.9)
LYMPH%: 15.1 % (ref 14.0–48.0)
MCH: 30.5 pg (ref 28.0–33.4)
MCHC: 30.5 g/dL — ABNORMAL LOW (ref 32.0–35.9)
MCV: 100 fL — ABNORMAL HIGH (ref 82–98)
MONO%: 7.8 % (ref 0.0–13.0)
NEUT%: 76 % (ref 40.0–80.0)
RDW: 20.4 % — ABNORMAL HIGH (ref 11.1–15.7)

## 2013-01-09 MED ORDER — SODIUM CHLORIDE 0.9 % IV SOLN
250.0000 mL | Freq: Once | INTRAVENOUS | Status: AC
  Administered 2013-01-09: 250 mL via INTRAVENOUS

## 2013-01-09 MED ORDER — FUROSEMIDE 10 MG/ML IJ SOLN
20.0000 mg | Freq: Once | INTRAMUSCULAR | Status: AC
  Administered 2013-01-09: 20 mg via INTRAVENOUS

## 2013-01-09 MED ORDER — ACETAMINOPHEN 325 MG PO TABS
650.0000 mg | ORAL_TABLET | Freq: Once | ORAL | Status: AC
  Administered 2013-01-09: 650 mg via ORAL

## 2013-01-09 NOTE — Patient Instructions (Signed)
Blood Transfusion Information WHAT IS A BLOOD TRANSFUSION? A transfusion is the replacement of blood or some of its parts. Blood is made up of multiple cells which provide different functions.  Red blood cells carry oxygen and are used for blood loss replacement.  White blood cells fight against infection.  Platelets control bleeding.  Plasma helps clot blood.  Other blood products are available for specialized needs, such as hemophilia or other clotting disorders. BEFORE THE TRANSFUSION  Who gives blood for transfusions?   You may be able to donate blood to be used at a later date on yourself (autologous donation).  Relatives can be asked to donate blood. This is generally not any safer than if you have received blood from a stranger. The same precautions are taken to ensure safety when a relative's blood is donated.  Healthy volunteers who are fully evaluated to make sure their blood is safe. This is blood bank blood. Transfusion therapy is the safest it has ever been in the practice of medicine. Before blood is taken from a donor, a complete history is taken to make sure that person has no history of diseases nor engages in risky social behavior (examples are intravenous drug use or sexual activity with multiple partners). The donor's travel history is screened to minimize risk of transmitting infections, such as malaria. The donated blood is tested for signs of infectious diseases, such as HIV and hepatitis. The blood is then tested to be sure it is compatible with you in order to minimize the chance of a transfusion reaction. If you or a relative donates blood, this is often done in anticipation of surgery and is not appropriate for emergency situations. It takes many days to process the donated blood. RISKS AND COMPLICATIONS Although transfusion therapy is very safe and saves many lives, the main dangers of transfusion include:   Getting an infectious disease.  Developing a  transfusion reaction. This is an allergic reaction to something in the blood you were given. Every precaution is taken to prevent this. The decision to have a blood transfusion has been considered carefully by your caregiver before blood is given. Blood is not given unless the benefits outweigh the risks. AFTER THE TRANSFUSION  Right after receiving a blood transfusion, you will usually feel much better and more energetic. This is especially true if your red blood cells have gotten low (anemic). The transfusion raises the level of the red blood cells which carry oxygen, and this usually causes an energy increase.  The nurse administering the transfusion will monitor you carefully for complications. HOME CARE INSTRUCTIONS  No special instructions are needed after a transfusion. You may find your energy is better. Speak with your caregiver about any limitations on activity for underlying diseases you may have. SEEK MEDICAL CARE IF:   Your condition is not improving after your transfusion.  You develop redness or irritation at the intravenous (IV) site. SEEK IMMEDIATE MEDICAL CARE IF:  Any of the following symptoms occur over the next 12 hours:  Shaking chills.  You have a temperature by mouth above 102 F (38.9 C), not controlled by medicine.  Chest, back, or muscle pain.  People around you feel you are not acting correctly or are confused.  Shortness of breath or difficulty breathing.  Dizziness and fainting.  You get a rash or develop hives.  You have a decrease in urine output.  Your urine turns a dark color or changes to pink, red, or brown. Any of the following   symptoms occur over the next 10 days:  You have a temperature by mouth above 102 F (38.9 C), not controlled by medicine.  Shortness of breath.  Weakness after normal activity.  The white part of the eye turns yellow (jaundice).  You have a decrease in the amount of urine or are urinating less often.  Your  urine turns a dark color or changes to pink, red, or brown. Document Released: 07/13/2000 Document Revised: 10/08/2011 Document Reviewed: 03/01/2008 ExitCare Patient Information 2014 [employer].  

## 2013-01-10 LAB — TYPE AND SCREEN
Antibody Screen: NEGATIVE
Unit division: 0

## 2013-01-27 ENCOUNTER — Encounter (HOSPITAL_COMMUNITY)
Admission: RE | Admit: 2013-01-27 | Discharge: 2013-01-27 | Disposition: A | Discharge status: Home / Self Care | Payer: BC Managed Care – PPO | Source: Ambulatory Visit | Attending: Hematology & Oncology | Admitting: Hematology & Oncology

## 2013-01-27 DIAGNOSIS — D474 Osteomyelofibrosis: Secondary | ICD-10-CM | POA: Insufficient documentation

## 2013-01-27 DIAGNOSIS — D471 Chronic myeloproliferative disease: Secondary | ICD-10-CM | POA: Insufficient documentation

## 2013-02-13 ENCOUNTER — Other Ambulatory Visit (HOSPITAL_BASED_OUTPATIENT_CLINIC_OR_DEPARTMENT_OTHER): Payer: BC Managed Care – PPO | Admitting: Lab

## 2013-02-13 ENCOUNTER — Ambulatory Visit (HOSPITAL_BASED_OUTPATIENT_CLINIC_OR_DEPARTMENT_OTHER): Payer: BC Managed Care – PPO | Admitting: Hematology & Oncology

## 2013-02-13 VITALS — BP 138/52 | HR 78 | Temp 98.5°F | Resp 18 | Ht 69.0 in | Wt 177.0 lb

## 2013-02-13 DIAGNOSIS — D474 Osteomyelofibrosis: Secondary | ICD-10-CM

## 2013-02-13 LAB — HOLD TUBE, BLOOD BANK - CHCC SATELLITE

## 2013-02-13 LAB — CBC WITH DIFFERENTIAL (CANCER CENTER ONLY)
Eosinophils Absolute: 0 10*3/uL (ref 0.0–0.5)
MONO#: 0.3 10*3/uL (ref 0.1–0.9)
MONO%: 6.6 % (ref 0.0–13.0)
NEUT#: 3.1 10*3/uL (ref 1.5–6.5)
Platelets: 194 10*3/uL (ref 145–400)
RBC: 2.03 10*6/uL — ABNORMAL LOW (ref 4.20–5.70)
WBC: 3.8 10*3/uL — ABNORMAL LOW (ref 4.0–10.0)

## 2013-02-13 LAB — COMPREHENSIVE METABOLIC PANEL
ALT: 14 U/L (ref 0–53)
AST: 13 U/L (ref 0–37)
Calcium: 8.6 mg/dL (ref 8.4–10.5)
Chloride: 108 mEq/L (ref 96–112)
Creatinine, Ser: 1.16 mg/dL (ref 0.50–1.35)
Potassium: 4.4 mEq/L (ref 3.5–5.3)

## 2013-02-13 LAB — LACTATE DEHYDROGENASE: LDH: 223 U/L (ref 94–250)

## 2013-02-13 NOTE — Progress Notes (Signed)
This office note has been dictated.

## 2013-02-16 NOTE — Progress Notes (Signed)
CC:   Brian Darner, MD, Fax [phone]  DIAGNOSIS:  Myelofibrosis - JAK2 positive.  CURRENT THERAPY:  Jakafi 5 mg p.o. b.i.d.  INTERIM HISTORY:  Brian Bowen comes in for followup.  He is doing okay. We did have to transfuse him a couple weeks ago.  He tolerated this fairly well.  He has had no problems with bleeding.  His systemic symptoms of sweats, bony pain, fevers, have all resolved with the [facility].  Unfortunately, he is quite anemic with the [facility].  Again, he was last, I think, transfused back on June 13th.  He has had no cough or shortness of breath.  There have been no rashes.  PHYSICAL EXAM:  General:  This is a well-developed, well-nourished white gentleman in no obvious distress.  Vital Signs:  Show a temperature of 98.5, pulse 78, respiratory rate 18, blood pressure 138/52.  Weight is 177.  Head and Neck:  Show a normocephalic, atraumatic skull.  There are no ocular or oral lesions.  There are no palpable cervical or supraclavicular lymph nodes.  Lungs:  Clear bilaterally.  Cardiac: Regular rate and rhythm with a normal S1 and S2.  There are no murmurs, rubs, or bruits.  Abdomen:  Soft with good bowel sounds.  There is no palpable abdominal mass.  There is no fluid wave.  There is no palpable hepatomegaly.  Spleen tip is about 6 cm below the left costal margin. Extremities:  Show no clubbing, cyanosis, or edema.  Neurological: Shows no focal neurological deficits.  LABORATORY STUDIES:  White cell count is 3.8.  Hemoglobin is 6.3. Hematocrit 20.1, platelet count 194.  MCV is 99.  IMPRESSION:  Brian Bowen is a 62 year old gentleman with myelofibrosis.  His spleen is enlarging a little bit.  I do not want to increase his Jakafi dose right now.  His other symptoms are still under good control.  He is active.  He is functional.  I do want to see back in about a month.  If I find that his spleen is enlarging, then we will definitely need to increase the  Jakafi.  When I see him back, I will order an ultrasound, and we will see how his spleen looks on the ultrasound.    ______________________________ Josph Macho, M.D. PRE/MEDQ  D:  02/13/2013  T:  02/14/2013  Job:  [id number]

## 2013-02-27 ENCOUNTER — Encounter (HOSPITAL_COMMUNITY)
Admission: RE | Admit: 2013-02-27 | Discharge: 2013-02-27 | Disposition: A | Discharge status: Home / Self Care | Payer: BC Managed Care – PPO | Source: Ambulatory Visit | Attending: Hematology & Oncology | Admitting: Hematology & Oncology

## 2013-02-27 DIAGNOSIS — D471 Chronic myeloproliferative disease: Secondary | ICD-10-CM | POA: Insufficient documentation

## 2013-02-27 DIAGNOSIS — D649 Anemia, unspecified: Secondary | ICD-10-CM

## 2013-02-27 DIAGNOSIS — D474 Osteomyelofibrosis: Secondary | ICD-10-CM

## 2013-03-03 ENCOUNTER — Other Ambulatory Visit: Payer: Self-pay | Admitting: *Deleted

## 2013-03-03 ENCOUNTER — Telehealth: Payer: Self-pay | Admitting: Hematology & Oncology

## 2013-03-03 DIAGNOSIS — D649 Anemia, unspecified: Secondary | ICD-10-CM

## 2013-03-03 DIAGNOSIS — D474 Osteomyelofibrosis: Secondary | ICD-10-CM

## 2013-03-03 NOTE — Telephone Encounter (Signed)
Pt aware of 8-6 lab and 8-7 blood transfusion

## 2013-03-03 NOTE — Progress Notes (Signed)
Pt called stating he felt like he needed a blood transfusion. He can hear his heartbeat in his ear, is more short of breath and fatigued. Offered to bring him in today for T&C for blood tomorrow but he decline wanting it for 03/05/13. He will come in on 8/6 for T&C. He understands that in the meantime he can go the ER if his sx worsen.

## 2013-03-04 ENCOUNTER — Telehealth: Payer: Self-pay | Admitting: Hematology & Oncology

## 2013-03-04 ENCOUNTER — Other Ambulatory Visit (HOSPITAL_BASED_OUTPATIENT_CLINIC_OR_DEPARTMENT_OTHER): Payer: BC Managed Care – PPO | Admitting: Lab

## 2013-03-04 DIAGNOSIS — D471 Chronic myeloproliferative disease: Secondary | ICD-10-CM

## 2013-03-04 DIAGNOSIS — D649 Anemia, unspecified: Secondary | ICD-10-CM

## 2013-03-04 DIAGNOSIS — D474 Osteomyelofibrosis: Secondary | ICD-10-CM

## 2013-03-04 LAB — CBC WITH DIFFERENTIAL (CANCER CENTER ONLY)
BASO#: 0 10*3/uL (ref 0.0–0.2)
Eosinophils Absolute: 0 10*3/uL (ref 0.0–0.5)
LYMPH%: 12.6 % — ABNORMAL LOW (ref 14.0–48.0)
MCH: 30.8 pg (ref 28.0–33.4)
MCV: 100 fL — ABNORMAL HIGH (ref 82–98)
MONO%: 8.5 % (ref 0.0–13.0)
Platelets: 213 10*3/uL (ref 145–400)
RBC: 1.69 10*6/uL — ABNORMAL LOW (ref 4.20–5.70)

## 2013-03-04 LAB — HOLD TUBE, BLOOD BANK - CHCC SATELLITE

## 2013-03-04 NOTE — Telephone Encounter (Signed)
Pt had received call from radiology wanting to change 8-20 [place] due to staffing. Pt talked to Dr. Myna Hidalgo and they decided to cx 8-20 [place]

## 2013-03-05 ENCOUNTER — Encounter: Payer: Self-pay | Admitting: Hematology & Oncology

## 2013-03-05 ENCOUNTER — Ambulatory Visit (HOSPITAL_BASED_OUTPATIENT_CLINIC_OR_DEPARTMENT_OTHER): Payer: BC Managed Care – PPO

## 2013-03-05 VITALS — BP 132/72 | HR 76 | Temp 97.8°F | Resp 16

## 2013-03-05 DIAGNOSIS — D649 Anemia, unspecified: Secondary | ICD-10-CM

## 2013-03-05 DIAGNOSIS — D474 Osteomyelofibrosis: Secondary | ICD-10-CM

## 2013-03-05 MED ORDER — ACETAMINOPHEN 325 MG PO TABS
650.0000 mg | ORAL_TABLET | Freq: Once | ORAL | Status: AC
  Administered 2013-03-05: 650 mg via ORAL

## 2013-03-05 MED ORDER — DIPHENHYDRAMINE HCL 25 MG PO CAPS
25.0000 mg | ORAL_CAPSULE | Freq: Once | ORAL | Status: AC
  Administered 2013-03-05: 25 mg via ORAL

## 2013-03-05 MED ORDER — SODIUM CHLORIDE 0.9 % IV SOLN
250.0000 mL | Freq: Once | INTRAVENOUS | Status: AC
  Administered 2013-03-05: 250 mL via INTRAVENOUS

## 2013-03-05 MED ORDER — FUROSEMIDE 10 MG/ML IJ SOLN
20.0000 mg | Freq: Once | INTRAMUSCULAR | Status: AC
  Administered 2013-03-05: 20 mg via INTRAVENOUS

## 2013-03-05 NOTE — Patient Instructions (Addendum)
Blood Transfusion Information WHAT IS A BLOOD TRANSFUSION? A transfusion is the replacement of blood or some of its parts. Blood is made up of multiple cells which provide different functions.  Red blood cells carry oxygen and are used for blood loss replacement.  White blood cells fight against infection.  Platelets control bleeding.  Plasma helps clot blood.  Other blood products are available for specialized needs, such as hemophilia or other clotting disorders. BEFORE THE TRANSFUSION  Who gives blood for transfusions?   You may be able to donate blood to be used at a later date on yourself (autologous donation).  Relatives can be asked to donate blood. This is generally not any safer than if you have received blood from a stranger. The same precautions are taken to ensure safety when a relative's blood is donated.  Healthy volunteers who are fully evaluated to make sure their blood is safe. This is blood bank blood. Transfusion therapy is the safest it has ever been in the practice of medicine. Before blood is taken from a donor, a complete history is taken to make sure that person has no history of diseases nor engages in risky social behavior (examples are intravenous drug use or sexual activity with multiple partners). The donor's travel history is screened to minimize risk of transmitting infections, such as malaria. The donated blood is tested for signs of infectious diseases, such as HIV and hepatitis. The blood is then tested to be sure it is compatible with you in order to minimize the chance of a transfusion reaction. If you or a relative donates blood, this is often done in anticipation of surgery and is not appropriate for emergency situations. It takes many days to process the donated blood. RISKS AND COMPLICATIONS Although transfusion therapy is very safe and saves many lives, the main dangers of transfusion include:   Getting an infectious disease.  Developing a  transfusion reaction. This is an allergic reaction to something in the blood you were given. Every precaution is taken to prevent this. The decision to have a blood transfusion has been considered carefully by your caregiver before blood is given. Blood is not given unless the benefits outweigh the risks. AFTER THE TRANSFUSION  Right after receiving a blood transfusion, you will usually feel much better and more energetic. This is especially true if your red blood cells have gotten low (anemic). The transfusion raises the level of the red blood cells which carry oxygen, and this usually causes an energy increase.  The nurse administering the transfusion will monitor you carefully for complications. HOME CARE INSTRUCTIONS  No special instructions are needed after a transfusion. You may find your energy is better. Speak with your caregiver about any limitations on activity for underlying diseases you may have. SEEK MEDICAL CARE IF:   Your condition is not improving after your transfusion.  You develop redness or irritation at the intravenous (IV) site. SEEK IMMEDIATE MEDICAL CARE IF:  Any of the following symptoms occur over the next 12 hours:  Shaking chills.  You have a temperature by mouth above 102 F (38.9 C), not controlled by medicine.  Chest, back, or muscle pain.  People around you feel you are not acting correctly or are confused.  Shortness of breath or difficulty breathing.  Dizziness and fainting.  You get a rash or develop hives.  You have a decrease in urine output.  Your urine turns a dark color or changes to pink, red, or brown. Any of the following   symptoms occur over the next 10 days:  You have a temperature by mouth above 102 F (38.9 C), not controlled by medicine.  Shortness of breath.  Weakness after normal activity.  The white part of the eye turns yellow (jaundice).  You have a decrease in the amount of urine or are urinating less often.  Your  urine turns a dark color or changes to pink, red, or brown. Document Released: 07/13/2000 Document Revised: 10/08/2011 Document Reviewed: 03/01/2008 ExitCare Patient Information 2014 [employer].  

## 2013-03-06 LAB — TYPE AND SCREEN: Unit division: 0

## 2013-03-18 ENCOUNTER — Ambulatory Visit (HOSPITAL_BASED_OUTPATIENT_CLINIC_OR_DEPARTMENT_OTHER): Payer: BC Managed Care – PPO

## 2013-03-18 ENCOUNTER — Other Ambulatory Visit (HOSPITAL_BASED_OUTPATIENT_CLINIC_OR_DEPARTMENT_OTHER): Payer: BC Managed Care – PPO | Admitting: Lab

## 2013-03-18 ENCOUNTER — Ambulatory Visit (HOSPITAL_BASED_OUTPATIENT_CLINIC_OR_DEPARTMENT_OTHER): Payer: BC Managed Care – PPO | Admitting: Hematology & Oncology

## 2013-03-18 VITALS — BP 120/58 | HR 69 | Temp 98.4°F | Resp 18 | Ht 69.0 in | Wt 175.0 lb

## 2013-03-18 DIAGNOSIS — D7581 Myelofibrosis: Secondary | ICD-10-CM

## 2013-03-18 DIAGNOSIS — D474 Osteomyelofibrosis: Secondary | ICD-10-CM

## 2013-03-18 LAB — CBC WITH DIFFERENTIAL (CANCER CENTER ONLY)
BASO%: 0.6 % (ref 0.0–2.0)
Eosinophils Absolute: 0 10*3/uL (ref 0.0–0.5)
LYMPH#: 0.5 10*3/uL — ABNORMAL LOW (ref 0.9–3.3)
MONO#: 0.3 10*3/uL (ref 0.1–0.9)
Platelets: 179 10*3/uL (ref 145–400)
RBC: 2.17 10*6/uL — ABNORMAL LOW (ref 4.20–5.70)
RDW: 17.9 % — ABNORMAL HIGH (ref 11.1–15.7)
WBC: 3.1 10*3/uL — ABNORMAL LOW (ref 4.0–10.0)

## 2013-03-18 LAB — TECHNOLOGIST REVIEW CHCC SATELLITE

## 2013-03-18 LAB — FERRITIN CHCC: Ferritin: 565 ng/ml — ABNORMAL HIGH (ref 22–316)

## 2013-03-18 LAB — HOLD TUBE, BLOOD BANK - CHCC SATELLITE

## 2013-03-18 LAB — IRON AND TIBC CHCC: Iron: 157 ug/dL (ref 42–163)

## 2013-03-18 NOTE — Progress Notes (Signed)
This office note has been dictated.

## 2013-03-19 NOTE — Progress Notes (Signed)
CC:   Linna Darner, MD, Fax [phone]  DIAGNOSIS:  Myelofibrosis, JAK2 positive.  CURRENT THERAPY:  Jakafi 5 mg p.o. b.i.d.  INTERIM HISTORY:  Mr. Azzara comes in for followup.  He is doing better now.  He was transfused back on I think August 7th.  The Jakafi really hits his blood counts quite hard.  Unfortunately, it helps him out immensely with this symptoms and with his splenomegaly.  We were supposed to get an ultrasound on him but he did not think that he needed it.  As such, we will hold off on this.  He has had no problems with rashes.  He has had no leg swelling.  There has been no change in bowel or bladder habits.  He has had no cough or shortness of breath.  He has had no fever.  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  PHYSICAL EXAMINATION:  General:  This is a well-developed, well- nourished white gentleman in no obvious distress.  Vital signs: Temperature of 98.4, pulse 69, respiratory rate 18, blood pressure 120/58.  Weight is 175.  Head and neck:  Normocephalic, atraumatic skull.  There are no ocular or oral lesions.  There are no palpable cervical or supraclavicular lymph nodes.  Lungs:  Clear bilaterally. Cardiac:  Regular rate and rhythm with a normal S1 and S2.  There are no murmurs, rubs or bruits.  Abdomen:  Soft.  He has good bowel sounds. There is no fluid wave.  There is no guarding or rebound tenderness. His liver edge is palpable with inspiration at the right costal margin. His spleen tip might be 2-3 cm below the left costal margin. Extremities:  No clubbing, cyanosis or edema.  Skin:  No rashes. Neurological:  No focal neurological deficits.  LABORATORY STUDIES:  White cell count is 3.1, hemoglobin 6.6, hematocrit 21.2, and platelet count 179,000.  MCV is 98.  IMPRESSION:  Mr. Cruzan is a nice 62 year old gentleman with myelofibrosis.  He is JAK2 positive.  He has done well with Jakafi with respect to his symptoms and  splenomegaly.  We will probably have to transfuse him every couple of months.  I need to watch closely for his iron studies.  We will need to recheck these when I see him back.  I will see him back in about 4 weeks.    ______________________________ Josph Macho, M.D. PRE/MEDQ  D:  03/18/2013  T:  03/19/2013  Job:  [id number]

## 2013-03-21 LAB — COMPREHENSIVE METABOLIC PANEL WITH GFR
ALT: 13 U/L (ref 0–53)
AST: 14 U/L (ref 0–37)
Albumin: 4.4 g/dL (ref 3.5–5.2)
Alkaline Phosphatase: 84 U/L (ref 39–117)
BUN: 14 mg/dL (ref 6–23)
CO2: 26 meq/L (ref 19–32)
Calcium: 8.6 mg/dL (ref 8.4–10.5)
Chloride: 107 meq/L (ref 96–112)
Creatinine, Ser: 1.4 mg/dL — ABNORMAL HIGH (ref 0.50–1.35)
Glucose, Bld: 93 mg/dL (ref 70–99)
Potassium: 4.2 meq/L (ref 3.5–5.3)
Sodium: 140 meq/L (ref 135–145)
Total Bilirubin: 0.9 mg/dL (ref 0.3–1.2)
Total Protein: 6.2 g/dL (ref 6.0–8.3)

## 2013-03-21 LAB — TRANSFERRIN RECEPTOR, SOLUABLE: Transferrin Receptor, Soluble: 1.58 mg/L (ref 0.76–1.76)

## 2013-04-02 ENCOUNTER — Ambulatory Visit (HOSPITAL_COMMUNITY)
Admission: RE | Admit: 2013-04-02 | Discharge: 2013-04-02 | Disposition: A | Discharge status: Home / Self Care | Payer: BC Managed Care – PPO | Source: Ambulatory Visit | Attending: Hematology & Oncology | Admitting: Hematology & Oncology

## 2013-04-02 DIAGNOSIS — D7581 Myelofibrosis: Secondary | ICD-10-CM | POA: Insufficient documentation

## 2013-04-02 DIAGNOSIS — R161 Splenomegaly, not elsewhere classified: Secondary | ICD-10-CM | POA: Insufficient documentation

## 2013-04-02 DIAGNOSIS — D649 Anemia, unspecified: Secondary | ICD-10-CM | POA: Insufficient documentation

## 2013-04-16 ENCOUNTER — Ambulatory Visit (HOSPITAL_BASED_OUTPATIENT_CLINIC_OR_DEPARTMENT_OTHER): Payer: BC Managed Care – PPO | Admitting: Hematology & Oncology

## 2013-04-16 ENCOUNTER — Other Ambulatory Visit (HOSPITAL_BASED_OUTPATIENT_CLINIC_OR_DEPARTMENT_OTHER): Payer: BC Managed Care – PPO | Admitting: Lab

## 2013-04-16 VITALS — BP 132/55 | HR 75 | Temp 98.1°F | Resp 16 | Ht 69.0 in | Wt 176.0 lb

## 2013-04-16 DIAGNOSIS — D7581 Myelofibrosis: Secondary | ICD-10-CM

## 2013-04-16 DIAGNOSIS — D474 Osteomyelofibrosis: Secondary | ICD-10-CM

## 2013-04-16 LAB — CBC WITH DIFFERENTIAL (CANCER CENTER ONLY)
BASO%: 0.3 % (ref 0.0–2.0)
EOS%: 0 % (ref 0.0–7.0)
HCT: 17.2 % — ABNORMAL LOW (ref 38.7–49.9)
LYMPH#: 0.5 10*3/uL — ABNORMAL LOW (ref 0.9–3.3)
MONO#: 0.2 10*3/uL (ref 0.1–0.9)
NEUT#: 2.9 10*3/uL (ref 1.5–6.5)
Platelets: 217 10*3/uL (ref 145–400)
RDW: 19.7 % — ABNORMAL HIGH (ref 11.1–15.7)
WBC: 3.6 10*3/uL — ABNORMAL LOW (ref 4.0–10.0)

## 2013-04-16 LAB — CMP (CANCER CENTER ONLY)
ALT(SGPT): 22 U/L (ref 10–47)
Albumin: 3.8 g/dL (ref 3.3–5.5)
CO2: 26 mEq/L (ref 18–33)
Calcium: 8.8 mg/dL (ref 8.0–10.3)
Chloride: 108 mEq/L (ref 98–108)
Glucose, Bld: 127 mg/dL — ABNORMAL HIGH (ref 73–118)
Potassium: 4.3 mEq/L (ref 3.3–4.7)
Sodium: 140 mEq/L (ref 128–145)
Total Protein: 6.5 g/dL (ref 6.4–8.1)

## 2013-04-16 LAB — TECHNOLOGIST REVIEW CHCC SATELLITE

## 2013-04-16 LAB — SAMPLE TO BLOOD BANK

## 2013-04-16 NOTE — Progress Notes (Signed)
This office note has been dictated.

## 2013-04-17 LAB — IRON AND TIBC CHCC
%SAT: 74 % — ABNORMAL HIGH (ref 20–55)
Iron: 201 ug/dL — ABNORMAL HIGH (ref 42–163)
TIBC: 271 ug/dL (ref 202–409)
UIBC: 70 ug/dL — ABNORMAL LOW (ref 117–376)

## 2013-04-28 NOTE — Progress Notes (Signed)
CC:   Brian Darner, MD, Fax [phone]  DIAGNOSIS:  Myelofibrosis - JAK2 positive.  CURRENT THERAPY: 1. Jakafi 5 mg p.o. b.i.d. 2. Palliative transfusions as indicated.  INTERIM HISTORY:  Brian Bowen comes in for his followup.  He continues to do fairly well.  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  This is a big time of year for him.  He feels well.  He has not had any problems with increasing fatigue or weakness.  There is no abdominal pain.  He has not had any sweats or chills.  He says that he sleeps all through the night now.  It is amazing that the [facility] really does help him out.  It really gets rid of his systemic symptoms.  Unfortunately, it causes anemia that we had to transfuse for.  Apparently, he has had no melena or bright red blood per rectum.  He has had no leg swelling.  There have been no rashes.  Overall, his performance status is ECOG 0.  His last iron studies showed a ferritin of 565 with an iron saturation of 64% back in August.  PHYSICAL EXAMINATION:  General:  This is a well-developed, well- nourished, white gentleman in no obvious distress.  Vital Signs: Temperature of 98.1, pulse 75, respiratory rate 16, blood pressure 132/55.  Weight is 176.  Head and Neck:  Normocephalic, atraumatic skull.  There are no ocular or oral lesions.  He has no palpable cervical or supraclavicular lymph nodes.  Conjunctivae might be slightly pale.  Thyroid is nonpalpable.  Lungs:  Clear bilaterally.  Cardiac: Regular rate and rhythm with a normal S1 and S2.  There are no murmurs, rubs or bruits.  Abdomen:  Soft.  He has good bowel sounds.  There is no guarding or rebound tenderness.  There is no palpable hepatomegaly. Spleen tip is probably about 3 cm below the left costal margin. Extremities:  No clubbing, cyanosis or edema.  Neurological:  No focal neurological deficits.  Skin:  No rashes, ecchymoses or petechia.  LABORATORY STUDIES:  White cell count  3.6, hemoglobin 5.4, hematocrit 17.2, platelet count 217.  MCV is 98.  His liver function tests are normal.  On his peripheral blood smear, which I have reviewed, he has some nucleated red blood cells.  He does have some anisocytosis and poikilocytosis.  He has a few myelocytes and metamyelocytes.  I see no blasts.  He has large platelets.  IMPRESSION:  Brian Bowen is a 62 year old gentleman with myelofibrosis. He is doing well from a symptomatic point of view.  His splenomegaly certainly is stable.  Again, I suspect that we likely will transfuse him in another week, or so.  He will let us know when he needs to be transfused.  He says that he does not feel the need right now.  I want to see him back in about 6 weeks' time.  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    ______________________________ Brian Bowen, M.D. PRE/MEDQ  D:  04/16/2013  T:  04/28/2013  Job:  [id number]

## 2013-04-30 ENCOUNTER — Other Ambulatory Visit: Payer: Self-pay | Admitting: *Deleted

## 2013-04-30 ENCOUNTER — Ambulatory Visit (HOSPITAL_COMMUNITY)
Admission: RE | Admit: 2013-04-30 | Discharge: 2013-04-30 | Disposition: A | Discharge status: Home / Self Care | Payer: BC Managed Care – PPO | Source: Ambulatory Visit | Attending: Hematology & Oncology | Admitting: Hematology & Oncology

## 2013-04-30 ENCOUNTER — Telehealth: Payer: Self-pay | Admitting: Hematology & Oncology

## 2013-04-30 DIAGNOSIS — D474 Osteomyelofibrosis: Secondary | ICD-10-CM

## 2013-04-30 DIAGNOSIS — D649 Anemia, unspecified: Secondary | ICD-10-CM

## 2013-04-30 DIAGNOSIS — D471 Chronic myeloproliferative disease: Secondary | ICD-10-CM | POA: Insufficient documentation

## 2013-04-30 NOTE — Telephone Encounter (Signed)
Left message with 10-3 lab and I would call with blood transfusion details for 10-4

## 2013-04-30 NOTE — Progress Notes (Signed)
Pt called requesting a CBC and blood transfusion. 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 Will request CBC with crossmatch on 05/01/13 at [facility] with 10/4 transfusion. Scheduling to call him with the times.

## 2013-05-01 ENCOUNTER — Other Ambulatory Visit: Payer: Self-pay | Admitting: *Deleted

## 2013-05-01 ENCOUNTER — Ambulatory Visit (HOSPITAL_BASED_OUTPATIENT_CLINIC_OR_DEPARTMENT_OTHER): Payer: BC Managed Care – PPO | Admitting: Nurse Practitioner

## 2013-05-01 ENCOUNTER — Other Ambulatory Visit: Payer: Self-pay | Admitting: Nurse Practitioner

## 2013-05-01 DIAGNOSIS — D649 Anemia, unspecified: Secondary | ICD-10-CM

## 2013-05-01 DIAGNOSIS — D474 Osteomyelofibrosis: Secondary | ICD-10-CM

## 2013-05-01 LAB — CBC WITH DIFFERENTIAL/PLATELET
Basophils Absolute: 0 10*3/uL (ref 0.0–0.1)
EOS%: 0.1 % (ref 0.0–7.0)
Eosinophils Absolute: 0 10*3/uL (ref 0.0–0.5)
HGB: 4.2 g/dL — CL (ref 13.0–17.1)
NEUT#: 2 10*3/uL (ref 1.5–6.5)
RDW: 22.4 % — ABNORMAL HIGH (ref 11.0–14.6)
lymph#: 0.5 10*3/uL — ABNORMAL LOW (ref 0.9–3.3)

## 2013-05-01 NOTE — Progress Notes (Signed)
Orders re-entered for blood for tomorrow due to complications with releasing the original order from yesterday.

## 2013-05-02 ENCOUNTER — Ambulatory Visit (HOSPITAL_BASED_OUTPATIENT_CLINIC_OR_DEPARTMENT_OTHER): Payer: BC Managed Care – PPO

## 2013-05-02 VITALS — BP 131/60 | HR 67 | Temp 97.9°F | Resp 18

## 2013-05-02 DIAGNOSIS — D474 Osteomyelofibrosis: Secondary | ICD-10-CM

## 2013-05-02 DIAGNOSIS — D649 Anemia, unspecified: Secondary | ICD-10-CM

## 2013-05-02 MED ORDER — DIPHENHYDRAMINE HCL 25 MG PO CAPS
25.0000 mg | ORAL_CAPSULE | Freq: Once | ORAL | Status: AC
  Administered 2013-05-02: 25 mg via ORAL

## 2013-05-02 MED ORDER — SODIUM CHLORIDE 0.9 % IV SOLN
250.0000 mL | Freq: Once | INTRAVENOUS | Status: AC
  Administered 2013-05-02: 250 mL via INTRAVENOUS

## 2013-05-02 MED ORDER — DIPHENHYDRAMINE HCL 25 MG PO CAPS
ORAL_CAPSULE | ORAL | Status: AC
  Filled 2013-05-02: qty 1

## 2013-05-02 MED ORDER — ACETAMINOPHEN 325 MG PO TABS
ORAL_TABLET | ORAL | Status: AC
  Filled 2013-05-02: qty 2

## 2013-05-02 MED ORDER — ACETAMINOPHEN 325 MG PO TABS
650.0000 mg | ORAL_TABLET | Freq: Once | ORAL | Status: AC
  Administered 2013-05-02: 650 mg via ORAL

## 2013-05-02 NOTE — Patient Instructions (Addendum)
Blood Transfusion Information WHAT IS A BLOOD TRANSFUSION? A transfusion is the replacement of blood or some of its parts. Blood is made up of multiple cells which provide different functions.  Red blood cells carry oxygen and are used for blood loss replacement.  White blood cells fight against infection.  Platelets control bleeding.  Plasma helps clot blood.  Other blood products are available for specialized needs, such as hemophilia or other clotting disorders. BEFORE THE TRANSFUSION  Who gives blood for transfusions?   You may be able to donate blood to be used at a later date on yourself (autologous donation).  Relatives can be asked to donate blood. This is generally not any safer than if you have received blood from a stranger. The same precautions are taken to ensure safety when a relative's blood is donated.  Healthy volunteers who are fully evaluated to make sure their blood is safe. This is blood bank blood. Transfusion therapy is the safest it has ever been in the practice of medicine. Before blood is taken from a donor, a complete history is taken to make sure that person has no history of diseases nor engages in risky social behavior (examples are intravenous drug use or sexual activity with multiple partners). The donor's travel history is screened to minimize risk of transmitting infections, such as malaria. The donated blood is tested for signs of infectious diseases, such as HIV and hepatitis. The blood is then tested to be sure it is compatible with you in order to minimize the chance of a transfusion reaction. If you or a relative donates blood, this is often done in anticipation of surgery and is not appropriate for emergency situations. It takes many days to process the donated blood. RISKS AND COMPLICATIONS Although transfusion therapy is very safe and saves many lives, the main dangers of transfusion include:   Getting an infectious disease.  Developing a  transfusion reaction. This is an allergic reaction to something in the blood you were given. Every precaution is taken to prevent this. The decision to have a blood transfusion has been considered carefully by your caregiver before blood is given. Blood is not given unless the benefits outweigh the risks. AFTER THE TRANSFUSION  Right after receiving a blood transfusion, you will usually feel much better and more energetic. This is especially true if your red blood cells have gotten low (anemic). The transfusion raises the level of the red blood cells which carry oxygen, and this usually causes an energy increase.  The nurse administering the transfusion will monitor you carefully for complications. HOME CARE INSTRUCTIONS  No special instructions are needed after a transfusion. You may find your energy is better. Speak with your caregiver about any limitations on activity for underlying diseases you may have. SEEK MEDICAL CARE IF:   Your condition is not improving after your transfusion.  You develop redness or irritation at the intravenous (IV) site. SEEK IMMEDIATE MEDICAL CARE IF:  Any of the following symptoms occur over the next 12 hours:  Shaking chills.  You have a temperature by mouth above 102 F (38.9 C), not controlled by medicine.  Chest, back, or muscle pain.  People around you feel you are not acting correctly or are confused.  Shortness of breath or difficulty breathing.  Dizziness and fainting.  You get a rash or develop hives.  You have a decrease in urine output.  Your urine turns a dark color or changes to pink, red, or brown. Any of the following   symptoms occur over the next 10 days:  You have a temperature by mouth above 102 F (38.9 C), not controlled by medicine.  Shortness of breath.  Weakness after normal activity.  The white part of the eye turns yellow (jaundice).  You have a decrease in the amount of urine or are urinating less often.  Your  urine turns a dark color or changes to pink, red, or brown. Document Released: 07/13/2000 Document Revised: 10/08/2011 Document Reviewed: 03/01/2008 ExitCare Patient Information 2014 [employer].  

## 2013-05-03 LAB — TYPE AND SCREEN
Antibody Screen: NEGATIVE
Unit division: 0
Unit division: 0

## 2013-06-05 ENCOUNTER — Other Ambulatory Visit (HOSPITAL_BASED_OUTPATIENT_CLINIC_OR_DEPARTMENT_OTHER): Payer: BC Managed Care – PPO | Admitting: Lab

## 2013-06-05 ENCOUNTER — Ambulatory Visit (HOSPITAL_BASED_OUTPATIENT_CLINIC_OR_DEPARTMENT_OTHER): Payer: BC Managed Care – PPO

## 2013-06-05 ENCOUNTER — Ambulatory Visit (HOSPITAL_BASED_OUTPATIENT_CLINIC_OR_DEPARTMENT_OTHER): Payer: BC Managed Care – PPO | Admitting: Hematology & Oncology

## 2013-06-05 ENCOUNTER — Other Ambulatory Visit: Payer: Self-pay | Admitting: *Deleted

## 2013-06-05 ENCOUNTER — Ambulatory Visit (HOSPITAL_COMMUNITY)
Admission: RE | Admit: 2013-06-05 | Discharge: 2013-06-05 | Disposition: A | Discharge status: Home / Self Care | Payer: BC Managed Care – PPO | Source: Ambulatory Visit | Attending: Hematology & Oncology | Admitting: Hematology & Oncology

## 2013-06-05 VITALS — BP 147/60 | HR 90 | Temp 98.4°F | Resp 14 | Ht 68.0 in | Wt 177.0 lb

## 2013-06-05 DIAGNOSIS — D474 Osteomyelofibrosis: Secondary | ICD-10-CM

## 2013-06-05 DIAGNOSIS — D471 Chronic myeloproliferative disease: Secondary | ICD-10-CM | POA: Insufficient documentation

## 2013-06-05 DIAGNOSIS — D649 Anemia, unspecified: Secondary | ICD-10-CM | POA: Insufficient documentation

## 2013-06-05 DIAGNOSIS — Z23 Encounter for immunization: Secondary | ICD-10-CM

## 2013-06-05 LAB — CBC WITH DIFFERENTIAL (CANCER CENTER ONLY)
BASO#: 0 10*3/uL (ref 0.0–0.2)
Eosinophils Absolute: 0 10*3/uL (ref 0.0–0.5)
HGB: 4.7 g/dL — CL (ref 13.0–17.1)
LYMPH%: 16.9 % (ref 14.0–48.0)
MCV: 97 fL (ref 82–98)
MONO#: 0.2 10*3/uL (ref 0.1–0.9)
Platelets: 171 10*3/uL (ref 145–400)
RBC: 1.54 10*6/uL — ABNORMAL LOW (ref 4.20–5.70)
RDW: 19.3 % — ABNORMAL HIGH (ref 11.1–15.7)
WBC: 2.5 10*3/uL — ABNORMAL LOW (ref 4.0–10.0)

## 2013-06-05 LAB — CHCC SATELLITE - SMEAR

## 2013-06-05 LAB — CMP (CANCER CENTER ONLY)
ALT(SGPT): 22 U/L (ref 10–47)
Alkaline Phosphatase: 77 U/L (ref 26–84)
CO2: 27 mEq/L (ref 18–33)
Sodium: 139 mEq/L (ref 128–145)
Total Bilirubin: 0.9 mg/dl (ref 0.20–1.60)
Total Protein: 6.5 g/dL (ref 6.4–8.1)

## 2013-06-05 LAB — HOLD TUBE, BLOOD BANK - CHCC SATELLITE

## 2013-06-05 LAB — LACTATE DEHYDROGENASE: LDH: 258 U/L — ABNORMAL HIGH (ref 94–250)

## 2013-06-05 MED ORDER — INFLUENZA VAC SPLIT QUAD 0.5 ML IM SUSP
0.5000 mL | Freq: Once | INTRAMUSCULAR | Status: AC
  Administered 2013-06-05: 0.5 mL via INTRAMUSCULAR
  Filled 2013-06-05: qty 0.5

## 2013-06-05 NOTE — Progress Notes (Signed)
This office note has been dictated.

## 2013-06-05 NOTE — Patient Instructions (Signed)
Fluconazole injection What is this medicine? FLUCONAZOLE (floo KON na zole) is an antifungal medicine. It is used to treat or prevent certain kinds of fungal or yeast infections. This medicine may be used for other purposes; ask your health care provider or pharmacist if you have questions. COMMON BRAND NAME(S): Diflucan What should I tell my health care provider before I take this medicine? They need to know if you have any of these conditions: -history of irregular heart beat -kidney disease -an unusual or allergic reaction to fluconazole, other antifungal medicines, foods, dyes or preservatives -pregnant or trying to get pregnant -breast-feeding How should I use this medicine? This medicine is for injection into a vein. It is usually given by a health care professional in a hospital or clinic setting. If you get this medicine at home, you will be taught how to prepare and give this medicine. Use exactly as directed. Take your medicine at regular intervals. Do not take your medicine more often than directed. It is important that you put your used needles and syringes in a special sharps container. Do not put them in a trash can. If you do not have a sharps container, call your pharmacist or healthcare provider to get one. Talk to your pediatrician regarding the use of this medicine in children. Special care may be needed. Overdosage: If you think you have taken too much of this medicine contact a poison control center or emergency room at once. NOTE: This medicine is only for you. Do not share this medicine with others. What if I miss a dose? This does not apply. What may interact with this medicine? Do not take this medicine with any of the following medications: -cisapride -pimozide -red yeast rice This medicine may also interact with the following medications: -birth control pills -cyclosporine -diuretics like hydrochlorothiazide -medicines for diabetes that are taken by  mouth -medicines for high cholesterol like atorvastatin, lovastatin or simvastatin -phenytoin -ramelteon -rifabutin -rifampin -some medicines for anxiety or sleep -tacrolimus -terfenadine -theophylline -tofacitinib -warfarin This list may not describe all possible interactions. Give your health care provider a list of all the medicines, herbs, non-prescription drugs, or dietary supplements you use. Also tell them if you smoke, drink alcohol, or use illegal drugs. Some items may interact with your medicine. What should I watch for while using this medicine? Tell your doctor if your symptoms do not improve. If you are taking this medicine for a long time you may need blood work. Some fungal infections need many weeks or months of treatment to cure completely. Alcohol can increase possible damage to your liver from this medicine. Avoid alcoholic drinks. What side effects may I notice from receiving this medicine? Side effects that you should report to your doctor or health care professional as soon as possible: -allergic reactions like skin rash or itching, hives, swelling of the lips, mouth, tongue, or throat -dark urine -feeling dizzy or faint -irregular heartbeat or chest pain -pain, redness at site of injection -redness, blistering, peeling or loosening of the skin, including inside the mouth -stomach pain -trouble breathing -unusual bruising or bleeding -vomiting -yellowing of the eyes or skin Side effects that usually do not require medical attention (report to your doctor or health care professional if they continue or are bothersome): -changes in how food tastes -diarrhea -headache -stomach upset, nausea This list may not describe all possible side effects. Call your doctor for medical advice about side effects. You may report side effects to FDA at 1-800-FDA-1088. Where should   I keep my medicine? Keep out of the reach of children. If you are using this medicine at home, you  will be instructed on how to store this medicine. Throw away any unused medicine after the expiration date on the label. NOTE: This sheet is a summary. It may not cover all possible information. If you have questions about this medicine, talk to your doctor, pharmacist, or health care provider.  2014, Elsevier/Gold Standard. (2012-04-15 15:12:39)

## 2013-06-08 ENCOUNTER — Other Ambulatory Visit: Payer: BC Managed Care – PPO | Admitting: Lab

## 2013-06-08 LAB — FERRITIN CHCC: Ferritin: 679 ng/ml — ABNORMAL HIGH (ref 22–316)

## 2013-06-08 LAB — PREPARE RBC (CROSSMATCH)

## 2013-06-08 NOTE — Progress Notes (Signed)
CC:   Debbe Bales, M.D., Fax [phone]  DIAGNOSIS:  Myelofibrosis -- JAK2 positive.  CURRENT THERAPY: 1. Jakafi 5 mg p.o. b.i.d. 2. Palliative transfusions as indicated.  INTERIM HISTORY:  Mr. Silversmith comes in for followup.  He feels okay.  He does feel little tired.  He certainly knows when his hemoglobin is down.  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  He says the [facility] really makes him feel well.  On Jakafi, he has no sweats.  He has no bony pain.  He has no abdominal pain.  He does feel better.  He has had no fever.  There has been no cough or shortness of breath. He does feel little short of breath with moderate exertion.  He has had no rashes.  He has had no diarrhea.  There has been no headache.  He says he can feel a "whooshing" sound in his ears.  PHYSICAL EXAMINATION:  General:  This is a well-developed, well- nourished white gentleman, in no obvious distress.  Vital Signs: Temperature of 98.4, pulse 90, respiratory rate 14, blood pressure 147/60, weight is 177 pounds.  Head and Neck:  Normocephalic, atraumatic skull.  There are no ocular or oral lesions.  There are no palpable cervical or supraclavicular lymph nodes.  Lungs:  Clear bilaterally. Cardiac:  Regular rate and rhythm with a normal S1 and S2.  There are no murmurs, rubs, or bruits.  Abdomen:  Soft.  He has decent bowel sounds. There is no fluid wave.  There is no guarding or rebound tenderness.  He has no palpable hepatomegaly.  His spleen tip is about 2-3 cm below the left costal margin.  Spleen tip is smooth.  Extremities:  Show no trace edema in his legs.  He has good strength in his legs.  No joint issues are noted.  No tenderness is noted over the long bones.  Back:  No tenderness over the spine, ribs, or hips.  Neurological:  No focal neurological deficits.  Skin:  No rashes, ecchymoses, or petechia.  LABORATORY STUDIES:   White cell count is 2.5, hemoglobin 14.7, hematocrit 14.9, platelet count 171.  MCV is 97.  BUN 12, creatinine 1.0.  Calcium 8.3 with an albumin of 3.7.  Peripheral smear shows some anisocytosis and poikilocytosis.  There are teardrop cells.  There are couple of nucleated red blood cells.  He has no target cells.  White cells are mature.  There may be a couple of hypersegmented polys.  I saw no immature myeloid cells.  There were no blasts.  Platelets were adequate in number and size.  IMPRESSION:  Mr. Barkan is a 62 year old gentleman with myelofibrosis. Jakafi, again, has helped him incredibly a lot.  He has no symptoms when he is on Jakafi.  Unfortunately, the Jakafi really knocks down his blood count and he gets quite anemic.  I think he was last transfused probably about 5 or 6 weeks ago.  We will watch his iron studies.  In September, his ferritin was 567 with an iron saturation of 74%.  We are going to have to watch out with this and possibly institute oncolytic therapy if we find that his ferritin continues to increase.  We will get him back to see me in about 5 weeks or so.  He will be transfused next week.    ______________________________ Josph Macho, M.D. PRE/MEDQ  D:  06/05/2013  T:  06/06/2013  Job:  [id number]

## 2013-06-09 ENCOUNTER — Ambulatory Visit (HOSPITAL_BASED_OUTPATIENT_CLINIC_OR_DEPARTMENT_OTHER): Payer: BC Managed Care – PPO

## 2013-06-09 VITALS — BP 128/70 | HR 77 | Temp 98.0°F | Resp 20

## 2013-06-09 DIAGNOSIS — D474 Osteomyelofibrosis: Secondary | ICD-10-CM

## 2013-06-09 DIAGNOSIS — D649 Anemia, unspecified: Secondary | ICD-10-CM

## 2013-06-09 MED ORDER — SODIUM CHLORIDE 0.9 % IV SOLN
250.0000 mL | Freq: Once | INTRAVENOUS | Status: AC
  Administered 2013-06-09: 250 mL via INTRAVENOUS

## 2013-06-09 MED ORDER — ACETAMINOPHEN 325 MG PO TABS
ORAL_TABLET | ORAL | Status: AC
  Filled 2013-06-09: qty 2

## 2013-06-09 MED ORDER — DIPHENHYDRAMINE HCL 25 MG PO CAPS
25.0000 mg | ORAL_CAPSULE | Freq: Once | ORAL | Status: AC
  Administered 2013-06-09: 25 mg via ORAL

## 2013-06-09 MED ORDER — FUROSEMIDE 10 MG/ML IJ SOLN
INTRAMUSCULAR | Status: AC
  Filled 2013-06-09: qty 4

## 2013-06-09 MED ORDER — ACETAMINOPHEN 325 MG PO TABS
650.0000 mg | ORAL_TABLET | Freq: Once | ORAL | Status: AC
  Administered 2013-06-09: 650 mg via ORAL

## 2013-06-09 MED ORDER — DIPHENHYDRAMINE HCL 25 MG PO CAPS
ORAL_CAPSULE | ORAL | Status: AC
  Filled 2013-06-09: qty 1

## 2013-06-09 NOTE — Patient Instructions (Signed)
Blood Transfusion Information WHAT IS A BLOOD TRANSFUSION? A transfusion is the replacement of blood or some of its parts. Blood is made up of multiple cells which provide different functions.  Red blood cells carry oxygen and are used for blood loss replacement.  White blood cells fight against infection.  Platelets control bleeding.  Plasma helps clot blood.  Other blood products are available for specialized needs, such as hemophilia or other clotting disorders. BEFORE THE TRANSFUSION  Who gives blood for transfusions?   You may be able to donate blood to be used at a later date on yourself (autologous donation).  Relatives can be asked to donate blood. This is generally not any safer than if you have received blood from a stranger. The same precautions are taken to ensure safety when a relative's blood is donated.  Healthy volunteers who are fully evaluated to make sure their blood is safe. This is blood bank blood. Transfusion therapy is the safest it has ever been in the practice of medicine. Before blood is taken from a donor, a complete history is taken to make sure that person has no history of diseases nor engages in risky social behavior (examples are intravenous drug use or sexual activity with multiple partners). The donor's travel history is screened to minimize risk of transmitting infections, such as malaria. The donated blood is tested for signs of infectious diseases, such as HIV and hepatitis. The blood is then tested to be sure it is compatible with you in order to minimize the chance of a transfusion reaction. If you or a relative donates blood, this is often done in anticipation of surgery and is not appropriate for emergency situations. It takes many days to process the donated blood. RISKS AND COMPLICATIONS Although transfusion therapy is very safe and saves many lives, the main dangers of transfusion include:   Getting an infectious disease.  Developing a  transfusion reaction. This is an allergic reaction to something in the blood you were given. Every precaution is taken to prevent this. The decision to have a blood transfusion has been considered carefully by your caregiver before blood is given. Blood is not given unless the benefits outweigh the risks. AFTER THE TRANSFUSION  Right after receiving a blood transfusion, you will usually feel much better and more energetic. This is especially true if your red blood cells have gotten low (anemic). The transfusion raises the level of the red blood cells which carry oxygen, and this usually causes an energy increase.  The nurse administering the transfusion will monitor you carefully for complications. HOME CARE INSTRUCTIONS  No special instructions are needed after a transfusion. You may find your energy is better. Speak with your caregiver about any limitations on activity for underlying diseases you may have. SEEK MEDICAL CARE IF:   Your condition is not improving after your transfusion.  You develop redness or irritation at the intravenous (IV) site. SEEK IMMEDIATE MEDICAL CARE IF:  Any of the following symptoms occur over the next 12 hours:  Shaking chills.  You have a temperature by mouth above 102 F (38.9 C), not controlled by medicine.  Chest, back, or muscle pain.  People around you feel you are not acting correctly or are confused.  Shortness of breath or difficulty breathing.  Dizziness and fainting.  You get a rash or develop hives.  You have a decrease in urine output.  Your urine turns a dark color or changes to pink, red, or brown. Any of the following   symptoms occur over the next 10 days:  You have a temperature by mouth above 102 F (38.9 C), not controlled by medicine.  Shortness of breath.  Weakness after normal activity.  The white part of the eye turns yellow (jaundice).  You have a decrease in the amount of urine or are urinating less often.  Your  urine turns a dark color or changes to pink, red, or brown. Document Released: 07/13/2000 Document Revised: 10/08/2011 Document Reviewed: 03/01/2008 ExitCare Patient Information 2014 [employer].  

## 2013-06-10 ENCOUNTER — Encounter: Payer: Self-pay | Admitting: Hematology & Oncology

## 2013-06-10 LAB — TYPE AND SCREEN

## 2013-07-03 ENCOUNTER — Ambulatory Visit (HOSPITAL_COMMUNITY)
Admission: RE | Admit: 2013-07-03 | Discharge: 2013-07-03 | Disposition: A | Discharge status: Home / Self Care | Payer: BC Managed Care – PPO | Source: Ambulatory Visit | Attending: Hematology & Oncology | Admitting: Hematology & Oncology

## 2013-07-03 DIAGNOSIS — D649 Anemia, unspecified: Secondary | ICD-10-CM | POA: Insufficient documentation

## 2013-07-03 DIAGNOSIS — D7581 Myelofibrosis: Secondary | ICD-10-CM

## 2013-07-06 NOTE — Progress Notes (Signed)
CC:   Linna Darner, MD, Fax [phone]  DIAGNOSIS:  Myelofibrosis - JAK2 positive.  CURRENT THERAPY: 1. Jakafi 5 mg p.o. b.i.d. 2. Palliative transfusions as indicated.  INTERIM HISTORY:  Brian Bowen comes in for his followup.  He continues to do fairly well.  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  This is a big time of year for him.  He feels well.  He has not had any problems with increasing fatigue or weakness.  There is no abdominal pain.  He has not had any sweats or chills.  He says that he sleeps all through the night now.  It is amazing that the [facility] really does help him out.  It really gets rid of his systemic symptoms.  Unfortunately, it causes anemia that we have to transfuse for.  He has had no melena or bright red blood per rectum.  He has had no leg swelling.  There has been no rashes.  Overall, his performance status is ECOG 0.  His last iron studies showed a ferritin of 565 with iron saturation of 64% back in August.  PHYSICAL EXAMINATION:  General:  This is a well-developed, well- nourished white gentleman, in no obvious distress.  Vital Signs: Temperature of 98.1, pulse 75, respiratory rate 16, blood pressure 132/55.  Weight is 176.  Head and Neck:  Normocephalic, atraumatic skull.  There are no ocular or oral lesions.  He has no palpable cervical or supraclavicular lymph nodes.  Conjunctivae are likely slightly pale.  Thyroid is nonpalpable.  Lungs:  Clear bilaterally. Cardiac:  Regular rate and rhythm with a normal S1 and S2.  There are no murmurs, rubs, or bruits.  Abdomen:  Soft.  He has good bowel sounds. There is no guarding or rebound tenderness.  There is no palpable hepatomegaly.  Spleen tip is probably about 3 cm below the left costal margin.  Extremities:  No clubbing, cyanosis, or edema.  Neurologic:  No focal neurological deficits.  Skin:  No rashes, ecchymoses, or petechiae.  LABORATORY STUDIES:  White cell count 3.6,  hemoglobin 5.4, hematocrit 17.2, platelet count 217.  MCV is 98.  His liver function tests are normal.  On his peripheral blood smear, which I reviewed, he has some nucleated red blood cells.  He does have some anisocytosis and poikilocytosis.  He has a few myelocytes and metamyelocytes.  I see no blasts.  He has large platelets.  IMPRESSION:  Brian Bowen is a 62 year old gentleman with myelofibrosis. He is doing well from a symptomatic point of view.  His splenomegaly certainly is stable.  Again, I suspect that we likely will transfuse him in another week or so.  He will let us know when he needs to be transfused.  He says that he does not feel the need right now.  I want to see him back in about 6 weeks' time.  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    ______________________________ Josph Macho, M.D. PRE/MEDQ  D:  04/16/2013  T:  07/05/2013  Job:  [id number]

## 2013-07-08 ENCOUNTER — Other Ambulatory Visit (HOSPITAL_BASED_OUTPATIENT_CLINIC_OR_DEPARTMENT_OTHER): Payer: BC Managed Care – PPO | Admitting: Lab

## 2013-07-08 ENCOUNTER — Ambulatory Visit (HOSPITAL_BASED_OUTPATIENT_CLINIC_OR_DEPARTMENT_OTHER): Payer: BC Managed Care – PPO | Admitting: Hematology & Oncology

## 2013-07-08 ENCOUNTER — Telehealth: Payer: Self-pay | Admitting: Hematology & Oncology

## 2013-07-08 VITALS — BP 134/50 | HR 72 | Temp 98.2°F | Resp 14 | Ht 68.0 in | Wt 177.0 lb

## 2013-07-08 DIAGNOSIS — D474 Osteomyelofibrosis: Secondary | ICD-10-CM

## 2013-07-08 DIAGNOSIS — N200 Calculus of kidney: Secondary | ICD-10-CM

## 2013-07-08 LAB — IRON AND TIBC CHCC: %SAT: 71 % — ABNORMAL HIGH (ref 20–55)

## 2013-07-08 LAB — COMPREHENSIVE METABOLIC PANEL
ALT: 20 U/L (ref 0–53)
AST: 16 U/L (ref 0–37)
Alkaline Phosphatase: 95 U/L (ref 39–117)
CO2: 25 mEq/L (ref 19–32)
Sodium: 139 mEq/L (ref 135–145)
Total Bilirubin: 0.7 mg/dL (ref 0.3–1.2)
Total Protein: 6.1 g/dL (ref 6.0–8.3)

## 2013-07-08 LAB — CBC WITH DIFFERENTIAL (CANCER CENTER ONLY)
BASO#: 0 10*3/uL (ref 0.0–0.2)
BASO%: 1.2 % (ref 0.0–2.0)
EOS%: 0.4 % (ref 0.0–7.0)
HGB: 5 g/dL — CL (ref 13.0–17.1)
LYMPH#: 0.5 10*3/uL — ABNORMAL LOW (ref 0.9–3.3)
LYMPH%: 20.4 % (ref 14.0–48.0)
MCHC: 31.4 g/dL — ABNORMAL LOW (ref 32.0–35.9)
MCV: 96 fL (ref 82–98)
NEUT#: 1.7 10*3/uL (ref 1.5–6.5)
Platelets: 169 10*3/uL (ref 145–400)
RBC: 1.66 10*6/uL — ABNORMAL LOW (ref 4.20–5.70)
RDW: 18.2 % — ABNORMAL HIGH (ref 11.1–15.7)

## 2013-07-08 LAB — FERRITIN CHCC: Ferritin: 745 ng/ml — ABNORMAL HIGH (ref 22–316)

## 2013-07-08 LAB — LACTATE DEHYDROGENASE: LDH: 262 U/L — ABNORMAL HIGH (ref 94–250)

## 2013-07-08 LAB — TECHNOLOGIST REVIEW CHCC SATELLITE

## 2013-07-08 LAB — CHCC SATELLITE - SMEAR

## 2013-07-08 MED ORDER — FEBUXOSTAT 40 MG PO TABS: 40.0000 mg | ORAL_TABLET | Freq: Every day | ORAL | Status: DC

## 2013-07-08 NOTE — Progress Notes (Signed)
This office note has been dictated.

## 2013-07-08 NOTE — Telephone Encounter (Signed)
Pt has instruction sheet for 12-23 BMBX at [facility], and is aware he will have to wait up to an hour between lab and MD appointment on 08-05-13

## 2013-07-09 LAB — HOLD TUBE, BLOOD BANK - CHCC SATELLITE

## 2013-07-10 ENCOUNTER — Ambulatory Visit: Payer: BC Managed Care – PPO | Admitting: Hematology & Oncology

## 2013-07-10 ENCOUNTER — Other Ambulatory Visit: Payer: BC Managed Care – PPO | Admitting: Lab

## 2013-07-13 ENCOUNTER — Telehealth: Payer: Self-pay | Admitting: Hematology & Oncology

## 2013-07-13 NOTE — Telephone Encounter (Signed)
Pt made 12-18 lab and 12-19 transfusion appointments

## 2013-07-13 NOTE — Progress Notes (Signed)
CC:   Brian Darner, MD, Fax [phone]  DIAGNOSES: 1. Myelofibrosis-JAK2 positive. 2. Severe anemia-JAK2 related versus myelofibrosis.  CURRENT THERAPY:  Jakafi 5 mg p.o. b.i.d.  INTERIM HISTORY:  Brian Bowen comes in for his followup.  He is doing fairly well.  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  He recently had a CT scan by Dr. Vernie Ammons of Urology.  Brian Bowen has had issues with kidney stones.  Dr. Vernie Ammons stated there were no stones noted.  Brian Bowen is on allopurinol.  Brian Bowen was last transfused back on November 10.  He has had no obvious bleeding issues.  There has been no fever, sweats, or chills.  He has had no abdominal pain.  The Jakafi really makes his systemic symptoms much decreased.  When we last checked his iron studies, his ferritin was 679.  His last bone marrow test done was back in March of 2013.  I think, we probably has to do another bone marrow test on him to see where things stand with his myelofibrosis.  PHYSICAL EXAMINATION:  General:  This is a well-developed, well- nourished white gentleman, in no obvious distress.  Vital Signs: Temperature of 98.2, pulse 72, respiratory rate 14, blood pressure 132/50, and weight is 177 pounds.  Head and Neck:  Normocephalic, atraumatic skull.  There are no ocular or oral lesions.  There are no palpable, cervical, or supraclavicular lymph nodes.  Lungs:  Clear bilaterally.  Cardiac:  Regular rate and rhythm with a normal S1 and S2. He has 1/6 systolic ejection murmur.  Abdomen:  Soft.  He has good bowel sounds.  He has no palpable abdominal mass.  He has no fluid wave. There is no palpable hepatomegaly.  Spleen tip is about 3 cm below the left costal margin.  Back:  No tenderness over the spine, ribs, or hips. Extremities:  Show some trace edema in his legs.  He has no tenderness or swelling of his joints.  He has good muscle strength.  Skin:  No rashes, ecchymosis, or  petechia.  LABORATORY STUDIES:  White cell count is 2.5, hemoglobin 5, hematocrit 16, platelet count 169 and MCV is 96.  Peripheral blood smear shows some nucleated red blood cells.  He has occasional metamyelocytes and myelocytes.  I do not see any blasts.  He has no hypersegmented polys.  There is no rouleaux formation.  There is a few large platelets that are well granulated.  IMPRESSION:  Brian Bowen is a 62 year old gentleman with myelofibrosis. We have been following him now for a good, I think 3 to 4 years.  He has done very nicely.  Again, the Jakafi really helps his systemic symptoms, although, I think that it really affects his anemia.  We will try to go down once a day of Jakafi.  May be this might help with his anemia.  I will switch his allopurinol over to Uloric.  This may help with his anemia.  Erythropoietin that we checked on him, it was about 700.  As such, he would not qualify for any Aranesp or Procrit.  I will plan for a bone marrow test on December 23rd.  I spent a good half hour or so with Brian Bowen.  I reviewed all of his lab work.  We set December with the bone marrow test.  I will plan to see him back here in about a month.    ______________________________ Josph Macho, M.D. PRE/MEDQ  D:  07/08/2013  T:  07/09/2013  Job:  [id number]

## 2013-07-14 ENCOUNTER — Other Ambulatory Visit: Payer: Self-pay | Admitting: Nurse Practitioner

## 2013-07-14 ENCOUNTER — Other Ambulatory Visit: Payer: Self-pay | Admitting: *Deleted

## 2013-07-14 DIAGNOSIS — D474 Osteomyelofibrosis: Secondary | ICD-10-CM

## 2013-07-14 MED ORDER — RUXOLITINIB PHOSPHATE 5 MG PO TABS: 5.0000 mg | ORAL_TABLET | Freq: Two times a day (BID) | ORAL | Status: DC

## 2013-07-14 MED ORDER — RUXOLITINIB PHOSPHATE 10 MG PO TABS: 10.0000 mg | ORAL_TABLET | Freq: Two times a day (BID) | ORAL | Status: DC

## 2013-07-14 NOTE — Telephone Encounter (Signed)
Re-issued Jakafi rx to clarify dose. He is taking 5 mg BID. Re-faxed to [employer] at [phone].

## 2013-07-14 NOTE — Telephone Encounter (Signed)
error 

## 2013-07-16 ENCOUNTER — Other Ambulatory Visit (HOSPITAL_BASED_OUTPATIENT_CLINIC_OR_DEPARTMENT_OTHER): Payer: BC Managed Care – PPO

## 2013-07-16 ENCOUNTER — Other Ambulatory Visit: Payer: Self-pay | Admitting: Hematology & Oncology

## 2013-07-16 DIAGNOSIS — N200 Calculus of kidney: Secondary | ICD-10-CM

## 2013-07-16 DIAGNOSIS — D474 Osteomyelofibrosis: Secondary | ICD-10-CM

## 2013-07-16 DIAGNOSIS — D7581 Myelofibrosis: Secondary | ICD-10-CM

## 2013-07-16 LAB — COMPREHENSIVE METABOLIC PANEL (CC13)
ALT: 15 U/L (ref 0–55)
AST: 18 U/L (ref 5–34)
Albumin: 4 g/dL (ref 3.5–5.0)
BUN: 15.1 mg/dL (ref 7.0–26.0)
CO2: 22 mEq/L (ref 22–29)
Calcium: 8.8 mg/dL (ref 8.4–10.4)
Glucose: 88 mg/dl (ref 70–140)
Sodium: 141 mEq/L (ref 136–145)
Total Protein: 6.9 g/dL (ref 6.4–8.3)

## 2013-07-16 LAB — CBC & DIFF AND RETIC
BASO%: 0.8 % (ref 0.0–2.0)
Eosinophils Absolute: 0 10*3/uL (ref 0.0–0.5)
HGB: 4.2 g/dL — CL (ref 13.0–17.1)
Immature Retic Fract: 20.1 % — ABNORMAL HIGH (ref 3.00–10.60)
LYMPH%: 16 % (ref 14.0–49.0)
MCHC: 30.9 g/dL — ABNORMAL LOW (ref 32.0–36.0)
MCV: 91.9 fL (ref 79.3–98.0)
MONO#: 0.2 10*3/uL (ref 0.1–0.9)
MONO%: 8 % (ref 0.0–14.0)
NEUT#: 1.8 10*3/uL (ref 1.5–6.5)
Platelets: 148 10*3/uL (ref 140–400)
RBC: 1.48 10*6/uL — ABNORMAL LOW (ref 4.20–5.82)
RDW: 20.5 % — ABNORMAL HIGH (ref 11.0–14.6)
Retic %: 1.9 % — ABNORMAL HIGH (ref 0.80–1.80)
WBC: 2.4 10*3/uL — ABNORMAL LOW (ref 4.0–10.3)
lymph#: 0.4 10*3/uL — ABNORMAL LOW (ref 0.9–3.3)
nRBC: 1 % — ABNORMAL HIGH (ref 0–0)

## 2013-07-16 LAB — LACTATE DEHYDROGENASE (CC13): LDH: 352 U/L — ABNORMAL HIGH (ref 125–245)

## 2013-07-16 LAB — HOLD TUBE, BLOOD BANK - CHCC SATELLITE

## 2013-07-17 ENCOUNTER — Ambulatory Visit (HOSPITAL_BASED_OUTPATIENT_CLINIC_OR_DEPARTMENT_OTHER): Payer: BC Managed Care – PPO

## 2013-07-17 ENCOUNTER — Encounter (HOSPITAL_COMMUNITY): Payer: Self-pay | Admitting: Pharmacy Technician

## 2013-07-17 VITALS — BP 144/72 | HR 79 | Temp 97.9°F | Resp 16

## 2013-07-17 DIAGNOSIS — D7581 Myelofibrosis: Secondary | ICD-10-CM

## 2013-07-17 LAB — PREPARE RBC (CROSSMATCH)

## 2013-07-17 MED ORDER — FUROSEMIDE 10 MG/ML IJ SOLN
INTRAMUSCULAR | Status: AC
  Filled 2013-07-17: qty 4

## 2013-07-17 MED ORDER — ACETAMINOPHEN 325 MG PO TABS
ORAL_TABLET | ORAL | Status: AC
  Filled 2013-07-17: qty 2

## 2013-07-17 MED ORDER — ACETAMINOPHEN 325 MG PO TABS
650.0000 mg | ORAL_TABLET | Freq: Once | ORAL | Status: AC
  Administered 2013-07-17: 650 mg via ORAL

## 2013-07-17 MED ORDER — FUROSEMIDE 10 MG/ML IJ SOLN
10.0000 mg | Freq: Once | INTRAMUSCULAR | Status: AC
  Administered 2013-07-17: 10 mg via INTRAVENOUS

## 2013-07-17 MED ORDER — SODIUM CHLORIDE 0.9 % IV SOLN
250.0000 mL | Freq: Once | INTRAVENOUS | Status: AC
  Administered 2013-07-17: 250 mL via INTRAVENOUS

## 2013-07-17 NOTE — Patient Instructions (Signed)
Blood Transfusion Information WHAT IS A BLOOD TRANSFUSION? A transfusion is the replacement of blood or some of its parts. Blood is made up of multiple cells which provide different functions.  Red blood cells carry oxygen and are used for blood loss replacement.  White blood cells fight against infection.  Platelets control bleeding.  Plasma helps clot blood.  Other blood products are available for specialized needs, such as hemophilia or other clotting disorders. BEFORE THE TRANSFUSION  Who gives blood for transfusions?   You may be able to donate blood to be used at a later date on yourself (autologous donation).  Relatives can be asked to donate blood. This is generally not any safer than if you have received blood from a stranger. The same precautions are taken to ensure safety when a relative's blood is donated.  Healthy volunteers who are fully evaluated to make sure their blood is safe. This is blood bank blood. Transfusion therapy is the safest it has ever been in the practice of medicine. Before blood is taken from a donor, a complete history is taken to make sure that person has no history of diseases nor engages in risky social behavior (examples are intravenous drug use or sexual activity with multiple partners). The donor's travel history is screened to minimize risk of transmitting infections, such as malaria. The donated blood is tested for signs of infectious diseases, such as HIV and hepatitis. The blood is then tested to be sure it is compatible with you in order to minimize the chance of a transfusion reaction. If you or a relative donates blood, this is often done in anticipation of surgery and is not appropriate for emergency situations. It takes many days to process the donated blood. RISKS AND COMPLICATIONS Although transfusion therapy is very safe and saves many lives, the main dangers of transfusion include:   Getting an infectious disease.  Developing a  transfusion reaction. This is an allergic reaction to something in the blood you were given. Every precaution is taken to prevent this. The decision to have a blood transfusion has been considered carefully by your caregiver before blood is given. Blood is not given unless the benefits outweigh the risks. AFTER THE TRANSFUSION  Right after receiving a blood transfusion, you will usually feel much better and more energetic. This is especially true if your red blood cells have gotten low (anemic). The transfusion raises the level of the red blood cells which carry oxygen, and this usually causes an energy increase.  The nurse administering the transfusion will monitor you carefully for complications. HOME CARE INSTRUCTIONS  No special instructions are needed after a transfusion. You may find your energy is better. Speak with your caregiver about any limitations on activity for underlying diseases you may have. SEEK MEDICAL CARE IF:   Your condition is not improving after your transfusion.  You develop redness or irritation at the intravenous (IV) site. SEEK IMMEDIATE MEDICAL CARE IF:  Any of the following symptoms occur over the next 12 hours:  Shaking chills.  You have a temperature by mouth above 102 F (38.9 C), not controlled by medicine.  Chest, back, or muscle pain.  People around you feel you are not acting correctly or are confused.  Shortness of breath or difficulty breathing.  Dizziness and fainting.  You get a rash or develop hives.  You have a decrease in urine output.  Your urine turns a dark color or changes to pink, red, or brown. Any of the following   symptoms occur over the next 10 days:  You have a temperature by mouth above 102 F (38.9 C), not controlled by medicine.  Shortness of breath.  Weakness after normal activity.  The white part of the eye turns yellow (jaundice).  You have a decrease in the amount of urine or are urinating less often.  Your  urine turns a dark color or changes to pink, red, or brown. Document Released: 07/13/2000 Document Revised: 10/08/2011 Document Reviewed: 03/01/2008 ExitCare Patient Information 2014 [employer].  

## 2013-07-19 LAB — TYPE AND SCREEN
ABO/RH(D): O POS
Antibody Screen: NEGATIVE
Unit division: 0

## 2013-07-20 ENCOUNTER — Encounter: Payer: Self-pay | Admitting: Hematology & Oncology

## 2013-07-21 ENCOUNTER — Encounter (HOSPITAL_COMMUNITY): Payer: Self-pay

## 2013-07-21 ENCOUNTER — Encounter: Payer: BC Managed Care – PPO | Admitting: Hematology & Oncology

## 2013-07-21 ENCOUNTER — Ambulatory Visit (HOSPITAL_BASED_OUTPATIENT_CLINIC_OR_DEPARTMENT_OTHER): Payer: BC Managed Care – PPO | Admitting: Hematology & Oncology

## 2013-07-21 ENCOUNTER — Ambulatory Visit (HOSPITAL_COMMUNITY)
Admission: RE | Admit: 2013-07-21 | Discharge: 2013-07-21 | Disposition: A | Discharge status: Home / Self Care | Payer: BC Managed Care – PPO | Source: Ambulatory Visit | Attending: Hematology & Oncology | Admitting: Hematology & Oncology

## 2013-07-21 VITALS — BP 114/46 | HR 71 | Temp 98.3°F | Resp 16 | Ht 68.0 in | Wt 177.0 lb

## 2013-07-21 DIAGNOSIS — D7581 Myelofibrosis: Secondary | ICD-10-CM | POA: Insufficient documentation

## 2013-07-21 DIAGNOSIS — D72819 Decreased white blood cell count, unspecified: Secondary | ICD-10-CM | POA: Insufficient documentation

## 2013-07-21 DIAGNOSIS — D759 Disease of blood and blood-forming organs, unspecified: Secondary | ICD-10-CM | POA: Insufficient documentation

## 2013-07-21 DIAGNOSIS — D649 Anemia, unspecified: Secondary | ICD-10-CM | POA: Insufficient documentation

## 2013-07-21 DIAGNOSIS — D474 Osteomyelofibrosis: Secondary | ICD-10-CM

## 2013-07-21 LAB — CBC WITH DIFFERENTIAL/PLATELET
Basophils Relative: 1 % (ref 0–1)
Eosinophils Relative: 0 % (ref 0–5)
HCT: 18.3 % — ABNORMAL LOW (ref 39.0–52.0)
Hemoglobin: 5.9 g/dL — CL (ref 13.0–17.0)
Lymphocytes Relative: 21 % (ref 12–46)
Lymphs Abs: 0.5 10*3/uL — ABNORMAL LOW (ref 0.7–4.0)
MCHC: 32.2 g/dL (ref 30.0–36.0)
MCV: 91.5 fL (ref 78.0–100.0)
Monocytes Absolute: 0.2 10*3/uL (ref 0.1–1.0)
Monocytes Relative: 9 % (ref 3–12)
Neutro Abs: 1.6 10*3/uL — ABNORMAL LOW (ref 1.7–7.7)
RBC: 2 MIL/uL — ABNORMAL LOW (ref 4.22–5.81)
RDW: 17.9 % — ABNORMAL HIGH (ref 11.5–15.5)

## 2013-07-21 MED ORDER — MEPERIDINE HCL 50 MG/ML IJ SOLN
50.0000 mg | Freq: Once | INTRAMUSCULAR | Status: DC
  Filled 2013-07-21: qty 1

## 2013-07-21 MED ORDER — MIDAZOLAM HCL 2 MG/2ML IJ SOLN
INTRAMUSCULAR | Status: AC | PRN
  Administered 2013-07-21: 1 mg via INTRAVENOUS
  Administered 2013-07-21: 2.5 mg via INTRAVENOUS

## 2013-07-21 MED ORDER — SODIUM CHLORIDE 0.9 % IV SOLN
INTRAVENOUS | Status: DC
  Administered 2013-07-21: 20 mL/h via INTRAVENOUS

## 2013-07-21 MED ORDER — MIDAZOLAM HCL 10 MG/2ML IJ SOLN
10.0000 mg | Freq: Once | INTRAMUSCULAR | Status: DC
  Filled 2013-07-21: qty 2

## 2013-07-21 MED ORDER — MEPERIDINE HCL 25 MG/ML IJ SOLN
INTRAMUSCULAR | Status: AC | PRN
  Administered 2013-07-21: 12.5 mg via INTRAVENOUS
  Administered 2013-07-21: 25 mg via INTRAVENOUS

## 2013-07-21 NOTE — Progress Notes (Signed)
Dr. Myna Hidalgo notified of HGB results of 5.9, pt. Received blood transfusion on Friday Dec. 19th.

## 2013-07-24 NOTE — Progress Notes (Signed)
This is a procedure note for [name]'s bone marrow biopsy and aspirate. This was done at the short stay Center at [facility].  [name] an IV placed without difficulty. His Mallimpati score is 1. His ASA class is 1.  He had a total of 3.5 mg of Versed and 37.5 mg of Demerol for IV sedation.  The appropriate timeout procedure was done.  Is placed onto his right side. He then received his sedation.  The left posterior crest region was prepped and draped in sterile fashion. 8 cc of 2% lidocaine was infiltrated under the skin and down to the periosteum.  A #11 scalpel was used to make an incision into the skin. Despite for aspirate attempts, no marrow could be aspirated.  A 11 scalpel was used to make an other incision into the skin. We obtained an excellent bone marrow biopsy core with a Jamshidi biopsy needle.  Part of the specimen was sent off for flow cytometry and cytogenetics.  Is incision site was cleaned and dressed sterilely.  He tolerated the procedure well. There were no complications.  Brian E.

## 2013-07-31 ENCOUNTER — Ambulatory Visit (HOSPITAL_COMMUNITY)
Admission: RE | Admit: 2013-07-31 | Discharge: 2013-07-31 | Disposition: A | Discharge status: Home / Self Care | Payer: BC Managed Care – PPO | Source: Ambulatory Visit | Attending: Hematology & Oncology | Admitting: Hematology & Oncology

## 2013-07-31 DIAGNOSIS — D471 Chronic myeloproliferative disease: Secondary | ICD-10-CM | POA: Insufficient documentation

## 2013-07-31 DIAGNOSIS — D474 Osteomyelofibrosis: Secondary | ICD-10-CM

## 2013-07-31 DIAGNOSIS — D649 Anemia, unspecified: Secondary | ICD-10-CM | POA: Insufficient documentation

## 2013-08-03 LAB — CHROMOSOME ANALYSIS, BONE MARROW

## 2013-08-03 LAB — TISSUE HYBRIDIZATION (BONE MARROW)-NCBH

## 2013-08-04 ENCOUNTER — Other Ambulatory Visit: Payer: Self-pay | Admitting: Nurse Practitioner

## 2013-08-04 DIAGNOSIS — D474 Osteomyelofibrosis: Secondary | ICD-10-CM

## 2013-08-05 ENCOUNTER — Ambulatory Visit (HOSPITAL_BASED_OUTPATIENT_CLINIC_OR_DEPARTMENT_OTHER): Payer: BC Managed Care – PPO | Admitting: Hematology & Oncology

## 2013-08-05 ENCOUNTER — Other Ambulatory Visit (HOSPITAL_BASED_OUTPATIENT_CLINIC_OR_DEPARTMENT_OTHER): Payer: BC Managed Care – PPO | Admitting: Lab

## 2013-08-05 VITALS — BP 139/44 | HR 74 | Temp 98.6°F | Resp 18 | Ht 68.0 in | Wt 171.0 lb

## 2013-08-05 DIAGNOSIS — D649 Anemia, unspecified: Secondary | ICD-10-CM

## 2013-08-05 DIAGNOSIS — N200 Calculus of kidney: Secondary | ICD-10-CM

## 2013-08-05 DIAGNOSIS — D7581 Myelofibrosis: Secondary | ICD-10-CM

## 2013-08-05 DIAGNOSIS — D474 Osteomyelofibrosis: Secondary | ICD-10-CM

## 2013-08-05 LAB — CBC WITH DIFFERENTIAL (CANCER CENTER ONLY)
BASO#: 0 10*3/uL (ref 0.0–0.2)
BASO%: 0.3 % (ref 0.0–2.0)
EOS%: 0 % (ref 0.0–7.0)
Eosinophils Absolute: 0 10*3/uL (ref 0.0–0.5)
HCT: 17.7 % — ABNORMAL LOW (ref 38.7–49.9)
HGB: 5.4 g/dL — CL (ref 13.0–17.1)
LYMPH#: 0.5 10*3/uL — ABNORMAL LOW (ref 0.9–3.3)
LYMPH%: 17.6 % (ref 14.0–48.0)
MCH: 29.2 pg (ref 28.0–33.4)
MCHC: 30.5 g/dL — ABNORMAL LOW (ref 32.0–35.9)
MCV: 96 fL (ref 82–98)
MONO#: 0.3 10*3/uL (ref 0.1–0.9)
MONO%: 8.5 % (ref 0.0–13.0)
NEUT%: 73.6 % (ref 40.0–80.0)
NEUTROS ABS: 2.3 10*3/uL (ref 1.5–6.5)
PLATELETS: 206 10*3/uL (ref 145–400)
RBC: 1.85 10*6/uL — AB (ref 4.20–5.70)
RDW: 18.1 % — AB (ref 11.1–15.7)
WBC: 3.1 10*3/uL — ABNORMAL LOW (ref 4.0–10.0)

## 2013-08-05 LAB — COMPREHENSIVE METABOLIC PANEL
ALBUMIN: 4.3 g/dL (ref 3.5–5.2)
ALK PHOS: 78 U/L (ref 39–117)
ALT: 15 U/L (ref 0–53)
AST: 16 U/L (ref 0–37)
BILIRUBIN TOTAL: 0.7 mg/dL (ref 0.3–1.2)
BUN: 17 mg/dL (ref 6–23)
CO2: 26 mEq/L (ref 19–32)
Calcium: 8.5 mg/dL (ref 8.4–10.5)
Chloride: 107 mEq/L (ref 96–112)
Creatinine, Ser: 1.12 mg/dL (ref 0.50–1.35)
Glucose, Bld: 94 mg/dL (ref 70–99)
POTASSIUM: 4.5 meq/L (ref 3.5–5.3)
Sodium: 140 mEq/L (ref 135–145)
Total Protein: 6.3 g/dL (ref 6.0–8.3)

## 2013-08-05 LAB — URIC ACID: Uric Acid, Serum: 3.7 mg/dL — ABNORMAL LOW (ref 4.0–7.8)

## 2013-08-05 LAB — TECHNOLOGIST REVIEW CHCC SATELLITE: Tech Review: 1

## 2013-08-05 MED ORDER — DANAZOL 200 MG PO CAPS: ORAL_CAPSULE | ORAL | Status: DC

## 2013-08-05 MED ORDER — PREDNISONE 20 MG PO TABS: 20.0000 mg | ORAL_TABLET | Freq: Every day | ORAL | Status: DC

## 2013-08-05 NOTE — Progress Notes (Signed)
This office note has been dictated.

## 2013-08-06 NOTE — Progress Notes (Signed)
CC:   Brian Seller, MD, Fax [phone]  DIAGNOSES: 1. Myelofibrosis-JAK2 positive. 2. Severe anemia:  CURRENT THERAPY:  Jakafi 5 mg p.o. b.i.d.  INTERIM HISTORY:  Brian Bowen comes in for followup.  We did go ahead and do a repeat bone marrow biopsy on him.  This was done on December 23rd. His last bone marrow was probably done few years ago.  The bone marrow report ([id number]) showed myelofibrosis.  He had more fibrosis as compared to his prior bone marrow from I think 2013.  There is no evidence of any transformation to acute leukemia.  We did do chromosome studies.  Cytogenetics did not show any abnormalities.  He feels okay.  He says when he gets off [place], that he has lot of symptoms.  As such, he is back on Jakafi 10 mg a day.  He has not had any problems with cough or shortness of breath.  He has had no nausea or vomiting.  He has had I think 1 episode of diarrhea. He has not noted any kind of leg swelling.  PHYSICAL EXAMINATION:  General:  This is a fairly well-developed, well- nourished white gentleman in no obvious distress.  Vital Signs: Temperature of 98.6, pulse 74, respiratory rate 18, blood pressure 139/44, weight is 171 pounds.  Head and Neck:  Normocephalic, atraumatic skull.  There are no ocular or oral lesions.  There are no palpable cervical or supraclavicular lymph nodes.  Lungs:  Clear bilaterally. Cardiac:  Regular rate and rhythm with a normal S1 and S2.  There are no murmurs, rubs, or bruits.  Abdomen:  Soft.  He has good bowel sounds. There is no fluid wave.  There is no palpable abdominal mass.  There is no palpable hepatomegaly.  Spleen tip is about 2 cm below the left costal margin.  Back:  No tenderness over the spine, ribs, or hips. Extremities:  No clubbing, cyanosis, or edema.  Neurological:  No focal neurological deficits.  LABORATORY STUDIES:  White cell count is 3.1, hemoglobin 5.4, hematocrit 17.7, platelet count 206.  His  peripheral smear shows immature myeloid cells.  There may be 1 or 2 blasts.  Most of the myeloid cells that are immature are myelocytes and metamyelocytes.  There may be a couple of hypersegmented polys.  He has several nucleated red cells.  He has few large platelets.  IMPRESSION:  Brian Bowen is a 63 year old gentleman with myelofibrosis.  I forgot to mention that his iron studies that we did on him back in December showed a ferritin of 745 with an iron saturation of 71%.  I did speak with Dr. Victory Dakin at [facility].  Dr. Victory Dakin has seen Brian Bowen in the past.  Dr. Victory Dakin says that there is a new clinical trial that probably will not be available for 3 to 4 months that Brian Bowen probably qualifies for.  This actually helps decreasing the amount of fibrosis within the bone marrow.  Brian Bowen also would be I think a candidate for an allogeneic transplant.  He is 63 years old, so this is getting up there in a years, but he is in excellent shape.  He has I think a sister that could be considered.  I talked to him about this.  We need to try to see if we can get his red cell count up a little bit higher.  We will start him on some danazol.  We will start him at 200 mg a day and gradually titrate up to  600 mg a day after about 6 weeks.  We also will put him on prednisone at 20 mg a day.  Brian Bowen is asymptomatic with his anemia right now.  He and his wife are planning to go down to [place] in February.  I think this is important that he be able to go down there.  We will plan to get him back to see [place] in another couple weeks or so.  Again, we do have options for Brian Bowen.  Brian Bowen is helping his systemic symptoms, so he will stay on this.  We will plan to get him back in another 2 weeks or so for followup. Hopefully, we will find that his anemia is improving and that we can decrease his transfusion requirements.  I spent about 40-45 minutes with him today.  I went  over his bone marrow report.  I went over his lab work with him.  I explained the options that we have.    ______________________________ Volanda Napoleon, M.D. PRE/MEDQ  D:  08/05/2013  T:  08/06/2013  Job:  [id number]

## 2013-08-20 ENCOUNTER — Ambulatory Visit (HOSPITAL_BASED_OUTPATIENT_CLINIC_OR_DEPARTMENT_OTHER): Payer: BC Managed Care – PPO | Admitting: Hematology & Oncology

## 2013-08-20 ENCOUNTER — Encounter: Payer: Self-pay | Admitting: Hematology & Oncology

## 2013-08-20 ENCOUNTER — Other Ambulatory Visit (HOSPITAL_BASED_OUTPATIENT_CLINIC_OR_DEPARTMENT_OTHER): Payer: BC Managed Care – PPO | Admitting: Lab

## 2013-08-20 VITALS — BP 149/50 | HR 86 | Temp 98.5°F | Resp 18 | Ht 68.0 in | Wt 178.0 lb

## 2013-08-20 DIAGNOSIS — D649 Anemia, unspecified: Secondary | ICD-10-CM

## 2013-08-20 DIAGNOSIS — D474 Osteomyelofibrosis: Secondary | ICD-10-CM

## 2013-08-20 LAB — CMP (CANCER CENTER ONLY)
ALBUMIN: 3.4 g/dL (ref 3.3–5.5)
ALK PHOS: 39 U/L (ref 26–84)
ALT: 20 U/L (ref 10–47)
AST: 14 U/L (ref 11–38)
BUN, Bld: 15 mg/dL (ref 7–22)
CO2: 28 mEq/L (ref 18–33)
Calcium: 8.4 mg/dL (ref 8.0–10.3)
Chloride: 107 mEq/L (ref 98–108)
Creat: 1.3 mg/dl — ABNORMAL HIGH (ref 0.6–1.2)
Glucose, Bld: 163 mg/dL — ABNORMAL HIGH (ref 73–118)
POTASSIUM: 4.3 meq/L (ref 3.3–4.7)
SODIUM: 136 meq/L (ref 128–145)
Total Bilirubin: 0.7 mg/dl (ref 0.20–1.60)
Total Protein: 6.4 g/dL (ref 6.4–8.1)

## 2013-08-20 LAB — CBC WITH DIFFERENTIAL (CANCER CENTER ONLY)
BASO#: 0.1 10*3/uL (ref 0.0–0.2)
BASO%: 1 % (ref 0.0–2.0)
EOS%: 0 % (ref 0.0–7.0)
Eosinophils Absolute: 0 10*3/uL (ref 0.0–0.5)
HEMATOCRIT: 16.7 % — AB (ref 38.7–49.9)
HGB: 5 g/dL — CL (ref 13.0–17.1)
LYMPH#: 0.5 10*3/uL — ABNORMAL LOW (ref 0.9–3.3)
LYMPH%: 7.5 % — AB (ref 14.0–48.0)
MCH: 29.1 pg (ref 28.0–33.4)
MCHC: 29.9 g/dL — AB (ref 32.0–35.9)
MCV: 97 fL (ref 82–98)
MONO#: 0.3 10*3/uL (ref 0.1–0.9)
MONO%: 4.1 % (ref 0.0–13.0)
NEUT#: 5.4 10*3/uL (ref 1.5–6.5)
NEUT%: 87.4 % — AB (ref 40.0–80.0)
Platelets: 236 10*3/uL (ref 145–400)
RBC: 1.72 10*6/uL — ABNORMAL LOW (ref 4.20–5.70)
RDW: 21.1 % — ABNORMAL HIGH (ref 11.1–15.7)
WBC: 6.2 10*3/uL (ref 4.0–10.0)

## 2013-08-20 LAB — RETICULOCYTES (CHCC)
ABS Retic: 30.6 10*3/uL (ref 19.0–186.0)
RBC.: 1.8 MIL/uL — ABNORMAL LOW (ref 4.22–5.81)
Retic Ct Pct: 1.7 % (ref 0.4–2.3)

## 2013-08-20 LAB — TECHNOLOGIST REVIEW CHCC SATELLITE

## 2013-08-20 LAB — PREPARE RBC (CROSSMATCH)

## 2013-08-20 LAB — HOLD TUBE, BLOOD BANK - CHCC SATELLITE

## 2013-08-21 ENCOUNTER — Ambulatory Visit (HOSPITAL_BASED_OUTPATIENT_CLINIC_OR_DEPARTMENT_OTHER): Payer: BC Managed Care – PPO

## 2013-08-21 ENCOUNTER — Ambulatory Visit: Payer: BC Managed Care – PPO | Admitting: Hematology & Oncology

## 2013-08-21 ENCOUNTER — Other Ambulatory Visit: Payer: Self-pay | Admitting: *Deleted

## 2013-08-21 ENCOUNTER — Other Ambulatory Visit: Payer: BC Managed Care – PPO | Admitting: Lab

## 2013-08-21 VITALS — BP 123/57 | HR 79 | Temp 98.1°F | Resp 20

## 2013-08-21 DIAGNOSIS — D474 Osteomyelofibrosis: Secondary | ICD-10-CM

## 2013-08-21 LAB — FERRITIN CHCC: Ferritin: 847 ng/ml — ABNORMAL HIGH (ref 22–316)

## 2013-08-21 LAB — IRON AND TIBC CHCC
%SAT: 57 % — AB (ref 20–55)
Iron: 167 ug/dL — ABNORMAL HIGH (ref 42–163)
TIBC: 290 ug/dL (ref 202–409)
UIBC: 123 ug/dL (ref 117–376)

## 2013-08-21 LAB — PREPARE RBC (CROSSMATCH)

## 2013-08-21 MED ORDER — ACETAMINOPHEN 325 MG PO TABS
650.0000 mg | ORAL_TABLET | Freq: Once | ORAL | Status: AC
  Administered 2013-08-21: 650 mg via ORAL

## 2013-08-21 MED ORDER — FUROSEMIDE 10 MG/ML IJ SOLN
INTRAMUSCULAR | Status: AC
  Filled 2013-08-21: qty 4

## 2013-08-21 MED ORDER — FUROSEMIDE 10 MG/ML IJ SOLN
20.0000 mg | Freq: Once | INTRAMUSCULAR | Status: AC
  Administered 2013-08-21: 20 mg via INTRAVENOUS

## 2013-08-21 MED ORDER — SODIUM CHLORIDE 0.9 % IV SOLN
Freq: Once | INTRAVENOUS | Status: AC
  Administered 2013-08-21: 09:00:00 via INTRAVENOUS

## 2013-08-21 MED ORDER — DANAZOL 200 MG PO CAPS: 200.0000 mg | ORAL_CAPSULE | Freq: Three times a day (TID) | ORAL | Status: DC

## 2013-08-21 MED ORDER — ACETAMINOPHEN 325 MG PO TABS
ORAL_TABLET | ORAL | Status: AC
  Filled 2013-08-21: qty 2

## 2013-08-21 NOTE — Progress Notes (Signed)
This office note has been dictated.

## 2013-08-21 NOTE — Patient Instructions (Signed)
Blood Transfusion  A blood transfusion replaces your blood or some of its parts. Blood is replaced when you have lost blood because of surgery, an accident, or for severe blood conditions like anemia. You can donate blood to be used on yourself if you have a planned surgery. If you lose blood during that surgery, your own blood can be given back to you. Any blood given to you is checked to make sure it matches your blood type. Your temperature, blood pressure, and heart rate (vital signs) will be checked often.  GET HELP RIGHT AWAY IF:   You feel sick to your stomach (nauseous) or throw up (vomit).  You have watery poop (diarrhea).  You have shortness of breath or trouble breathing.  You have blood in your pee (urine) or have dark colored pee.  You have chest pain or tightness.  Your eyes or skin turn yellow (jaundice).  You have a temperature by mouth above 102 F (38.9 C), not controlled by medicine.  You start to shake and have chills.  You develop a a red rash (hives) or feel itchy.  You develop lightheadedness or feel confused.  You develop back, joint, or muscle pain.  You do not feel hungry (lost appetite).  You feel tired, restless, or nervous.  You develop belly (abdominal) cramps. Document Released: 10/12/2008 Document Revised: 10/08/2011 Document Reviewed: 10/12/2008 Exit[employer] Patient Information 2014 [employer].  

## 2013-08-23 LAB — TYPE AND SCREEN
ABO/RH(D): O POS
ANTIBODY SCREEN: NEGATIVE
UNIT DIVISION: 0
Unit division: 0

## 2013-08-24 ENCOUNTER — Encounter: Payer: Self-pay | Admitting: Hematology & Oncology

## 2013-08-24 NOTE — Progress Notes (Signed)
CC:   Brian Seller, MD, Fax [phone]  DIAGNOSES: 1. Myelofibrosis-JAK2 positive. 2. 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 3. Impending iron overload.  CURRENT THERAPY: 1. Danazol 400 mg p.o. daily. 2. Prednisone 20 mg p.o. daily.  INTERIM HISTORY:  Brian Bowen comes in for followup.  We last saw him back in early January.  We did a bone marrow on him back in December. The bone marrow showed that there was progression of his myelofibrosis.  Cytogenetic studies were normal.  He is on Jakafi.  He is on 10 mg a day of Jakafi.  He does well with this, although the anemia is quite severe.  The anemia also I think is secondary to the myelofibrosis.  We now have him on the danazol and prednisone to try to increase erythropoietic production.  His erythropoietin level is quite high, so ESA would not be helpful.  He is getting ready to go on a vacation with his wife.  He has not been for a couple years.  He is going next week down to [place].  He feels fairly well.  He is still working in the farm quite a bit.  He has had some leg swelling.  This may be from his medications.  Also may be from anemia.  He has had no change in bowel or bladder habits.  He has had no swelling.  He has had no sweats.  He has had no chills.  He has had no cough.  PHYSICAL EXAMINATION:  General:  This is a fairly well-developed, well- nourished white gentleman in no obvious distress.  Vital Signs: Temperature of 98.5, pulse 86, respiratory rate 18, blood pressure 159/50, and weight is 178 pounds.  Head and Neck:  Normocephalic, atraumatic skull.  There are no ocular or oral lesions.  Has no palpable cervical or supraclavicular lymph nodes.  There is no scleral icterus. Neck is supple with no adenopathy.  Lungs:  Clear bilaterally.  Cardiac: Regular rate and rhythm with a normal S1 and S2.  There are no murmurs, rubs, or bruits.  Abdomen:  Soft.  He has good bowel sounds.  There is no fluid  wave.  There is no guarding or rebound tenderness.  There is no palpable hepatomegaly.  His spleen tip is about 2 cm below the left costal margin.  Back:  No tenderness over the spine, ribs, or hips. Extremities:  Some 1+ edema in his lower legs.  He has good strength in his legs and arms.  He has decent range of motion of his joints.  Skin: No rashes, ecchymosis, or petechia.  LABORATORY STUDIES:  White cell count 6.2, hemoglobin 5, hematocrit 16.7, platelet count 236.  MCV is 97.  BUN is 15, creatinine 1.3.  Retic count is 0.3.  IMPRESSION:  Brian Bowen is a 63 year old gentleman with myelofibrosis. He is JAK2 positive.  He is on Jakafi 10 mg a day.  The anemia is certainly problematic.  Last bone marrow biopsy did show some progression of his disease.  We will try to see if the danazol or prednisone can improve his anemia. If not, then we are going to have to consider him for non iron-chelation therapy.  I do want to get him transfused before he goes on his vacation.  He will come in on the 23rd for 2 units of blood.  If we cannot improve his anemia with the danazol/prednisone, then we are going to have to consider him for either clinical trial patient or possibly stem  cell transplantation.  He is in good health.  He would be a candidate for stem cell transplantation.  I will plan to get him back to see me in another 3 weeks or so.  It is amazing how his body has accommodated his anemia.  He is incredibly anemic, but yet he is pretty asymptomatic with this.    ______________________________ Volanda Napoleon, M.D. PRE/MEDQ  D:  08/21/2013  T:  08/22/2013  Job:  [id number]

## 2013-09-07 ENCOUNTER — Ambulatory Visit (HOSPITAL_COMMUNITY)
Admission: RE | Admit: 2013-09-07 | Discharge: 2013-09-07 | Disposition: A | Discharge status: Home / Self Care | Payer: BC Managed Care – PPO | Source: Ambulatory Visit | Attending: Hematology & Oncology | Admitting: Hematology & Oncology

## 2013-09-07 DIAGNOSIS — D471 Chronic myeloproliferative disease: Secondary | ICD-10-CM | POA: Insufficient documentation

## 2013-09-07 DIAGNOSIS — D474 Osteomyelofibrosis: Secondary | ICD-10-CM | POA: Insufficient documentation

## 2013-09-07 DIAGNOSIS — D630 Anemia in neoplastic disease: Secondary | ICD-10-CM | POA: Insufficient documentation

## 2013-09-10 ENCOUNTER — Other Ambulatory Visit: Payer: Self-pay

## 2013-09-10 ENCOUNTER — Emergency Department (HOSPITAL_BASED_OUTPATIENT_CLINIC_OR_DEPARTMENT_OTHER): Payer: BC Managed Care – PPO

## 2013-09-10 ENCOUNTER — Other Ambulatory Visit (HOSPITAL_BASED_OUTPATIENT_CLINIC_OR_DEPARTMENT_OTHER): Payer: BC Managed Care – PPO | Admitting: Lab

## 2013-09-10 ENCOUNTER — Ambulatory Visit (HOSPITAL_BASED_OUTPATIENT_CLINIC_OR_DEPARTMENT_OTHER): Payer: BC Managed Care – PPO | Admitting: Hematology & Oncology

## 2013-09-10 ENCOUNTER — Inpatient Hospital Stay (HOSPITAL_BASED_OUTPATIENT_CLINIC_OR_DEPARTMENT_OTHER)
Admission: EM | Admit: 2013-09-10 | Discharge: 2013-09-15 | DRG: 250 | Disposition: A | Discharge status: Home / Self Care | Payer: BC Managed Care – PPO | Attending: Cardiology | Admitting: Cardiology

## 2013-09-10 ENCOUNTER — Encounter (HOSPITAL_BASED_OUTPATIENT_CLINIC_OR_DEPARTMENT_OTHER): Payer: Self-pay | Admitting: Emergency Medicine

## 2013-09-10 ENCOUNTER — Encounter: Payer: Self-pay | Admitting: Hematology & Oncology

## 2013-09-10 VITALS — BP 132/74 | HR 130 | Temp 99.3°F | Resp 20 | Ht 68.0 in | Wt 181.0 lb

## 2013-09-10 DIAGNOSIS — Z7982 Long term (current) use of aspirin: Secondary | ICD-10-CM

## 2013-09-10 DIAGNOSIS — I509 Heart failure, unspecified: Secondary | ICD-10-CM | POA: Diagnosis present

## 2013-09-10 DIAGNOSIS — Z87891 Personal history of nicotine dependence: Secondary | ICD-10-CM

## 2013-09-10 DIAGNOSIS — D7581 Myelofibrosis: Secondary | ICD-10-CM | POA: Diagnosis present

## 2013-09-10 DIAGNOSIS — D474 Osteomyelofibrosis: Secondary | ICD-10-CM

## 2013-09-10 DIAGNOSIS — D471 Chronic myeloproliferative disease: Secondary | ICD-10-CM

## 2013-09-10 DIAGNOSIS — D7589 Other specified diseases of blood and blood-forming organs: Secondary | ICD-10-CM | POA: Diagnosis present

## 2013-09-10 DIAGNOSIS — IMO0002 Reserved for concepts with insufficient information to code with codable children: Secondary | ICD-10-CM

## 2013-09-10 DIAGNOSIS — Z79899 Other long term (current) drug therapy: Secondary | ICD-10-CM

## 2013-09-10 DIAGNOSIS — D649 Anemia, unspecified: Secondary | ICD-10-CM

## 2013-09-10 DIAGNOSIS — R000 Tachycardia, unspecified: Secondary | ICD-10-CM

## 2013-09-10 DIAGNOSIS — I4892 Unspecified atrial flutter: Principal | ICD-10-CM | POA: Diagnosis present

## 2013-09-10 DIAGNOSIS — I5031 Acute diastolic (congestive) heart failure: Secondary | ICD-10-CM | POA: Diagnosis present

## 2013-09-10 HISTORY — DX: Unspecified atrial flutter: I48.92

## 2013-09-10 LAB — COMPREHENSIVE METABOLIC PANEL
ALBUMIN: 3.6 g/dL (ref 3.5–5.2)
ALT: 23 U/L (ref 0–53)
AST: 15 U/L (ref 0–37)
Alkaline Phosphatase: 33 U/L — ABNORMAL LOW (ref 39–117)
BILIRUBIN TOTAL: 0.5 mg/dL (ref 0.3–1.2)
BUN: 20 mg/dL (ref 6–23)
CALCIUM: 8.7 mg/dL (ref 8.4–10.5)
CO2: 22 meq/L (ref 19–32)
CREATININE: 1.4 mg/dL — AB (ref 0.50–1.35)
Chloride: 102 mEq/L (ref 96–112)
GFR calc Af Amer: 61 mL/min — ABNORMAL LOW (ref >90)
GFR, EST NON AFRICAN AMERICAN: 53 mL/min — AB (ref >90)
Glucose, Bld: 102 mg/dL — ABNORMAL HIGH (ref 70–99)
Potassium: 5.1 mEq/L (ref 3.7–5.3)
Sodium: 140 mEq/L (ref 137–147)
Total Protein: 7.2 g/dL (ref 6.0–8.3)

## 2013-09-10 LAB — CBC WITH DIFFERENTIAL (CANCER CENTER ONLY)
BASO#: 0.1 10*3/uL (ref 0.0–0.2)
BASO%: 1 % (ref 0.0–2.0)
EOS%: 0.1 % (ref 0.0–7.0)
Eosinophils Absolute: 0 10*3/uL (ref 0.0–0.5)
HEMATOCRIT: 22.4 % — AB (ref 38.7–49.9)
HEMOGLOBIN: 6.7 g/dL — AB (ref 13.0–17.1)
LYMPH#: 0.7 10*3/uL — ABNORMAL LOW (ref 0.9–3.3)
LYMPH%: 6.5 % — ABNORMAL LOW (ref 14.0–48.0)
MCH: 28.8 pg (ref 28.0–33.4)
MCHC: 29.9 g/dL — AB (ref 32.0–35.9)
MCV: 96 fL (ref 82–98)
MONO#: 0.6 10*3/uL (ref 0.1–0.9)
MONO%: 6.1 % (ref 0.0–13.0)
NEUT#: 9.1 10*3/uL — ABNORMAL HIGH (ref 1.5–6.5)
NEUT%: 86.3 % — AB (ref 40.0–80.0)
Platelets: 338 10*3/uL (ref 145–400)
RBC: 2.33 10*6/uL — AB (ref 4.20–5.70)
RDW: 22.1 % — ABNORMAL HIGH (ref 11.1–15.7)
WBC: 10.6 10*3/uL — ABNORMAL HIGH (ref 4.0–10.0)

## 2013-09-10 LAB — CMP (CANCER CENTER ONLY)
ALT: 19 U/L (ref 10–47)
AST: 18 U/L (ref 11–38)
Albumin: 3.2 g/dL — ABNORMAL LOW (ref 3.3–5.5)
Alkaline Phosphatase: 29 U/L (ref 26–84)
BUN, Bld: 18 mg/dL (ref 7–22)
CO2: 31 meq/L (ref 18–33)
CREATININE: 1.3 mg/dL — AB (ref 0.6–1.2)
Calcium: 8.6 mg/dL (ref 8.0–10.3)
Chloride: 106 mEq/L (ref 98–108)
GLUCOSE: 95 mg/dL (ref 73–118)
Potassium: 4.7 mEq/L (ref 3.3–4.7)
Sodium: 141 mEq/L (ref 128–145)
Total Bilirubin: 0.6 mg/dl (ref 0.20–1.60)
Total Protein: 6.7 g/dL (ref 6.4–8.1)

## 2013-09-10 LAB — CBC WITH DIFFERENTIAL/PLATELET
BASOS ABS: 0 10*3/uL (ref 0.0–0.1)
Basophils Relative: 0 % (ref 0–1)
EOS ABS: 0 10*3/uL (ref 0.0–0.7)
Eosinophils Relative: 0 % (ref 0–5)
HCT: 23.4 % — ABNORMAL LOW (ref 39.0–52.0)
HEMOGLOBIN: 7 g/dL — AB (ref 13.0–17.0)
LYMPHS ABS: 0.8 10*3/uL (ref 0.7–4.0)
Lymphocytes Relative: 7 % — ABNORMAL LOW (ref 12–46)
MCH: 28.7 pg (ref 26.0–34.0)
MCHC: 29.9 g/dL — ABNORMAL LOW (ref 30.0–36.0)
MCV: 95.9 fL (ref 78.0–100.0)
Monocytes Absolute: 0.8 10*3/uL (ref 0.1–1.0)
Monocytes Relative: 7 % (ref 3–12)
Neutro Abs: 10.5 10*3/uL — ABNORMAL HIGH (ref 1.7–7.7)
Neutrophils Relative %: 86 % — ABNORMAL HIGH (ref 43–77)
Platelets: 378 10*3/uL (ref 150–400)
RBC: 2.44 MIL/uL — ABNORMAL LOW (ref 4.22–5.81)
RDW: 22.3 % — AB (ref 11.5–15.5)
WBC: 12.1 10*3/uL — ABNORMAL HIGH (ref 4.0–10.5)

## 2013-09-10 LAB — TECHNOLOGIST REVIEW CHCC SATELLITE

## 2013-09-10 LAB — TROPONIN I

## 2013-09-10 LAB — SAMPLE TO BLOOD BANK

## 2013-09-10 LAB — PRO B NATRIURETIC PEPTIDE: Pro B Natriuretic peptide (BNP): 3623 pg/mL — ABNORMAL HIGH (ref 0–125)

## 2013-09-10 LAB — RETICULOCYTES (CHCC)
ABS Retic: 51 10*3/uL (ref 19.0–186.0)
RBC.: 2.43 MIL/uL — ABNORMAL LOW (ref 4.22–5.81)
RETIC CT PCT: 2.1 % (ref 0.4–2.3)

## 2013-09-10 LAB — LACTATE DEHYDROGENASE: LDH: 262 U/L — ABNORMAL HIGH (ref 94–250)

## 2013-09-10 LAB — PROTIME-INR
INR: 1.14 (ref 0.00–1.49)
PROTHROMBIN TIME: 14.4 s (ref 11.6–15.2)

## 2013-09-10 LAB — MRSA PCR SCREENING: MRSA by PCR: NEGATIVE

## 2013-09-10 MED ORDER — SODIUM CHLORIDE 0.9 % IJ SOLN: 10.0000 mL | INTRAMUSCULAR | Status: DC | PRN

## 2013-09-10 MED ORDER — HEPARIN SOD (PORK) LOCK FLUSH 100 UNIT/ML IV SOLN: 250.0000 [IU] | INTRAVENOUS | Status: DC | PRN

## 2013-09-10 MED ORDER — DEXTROSE 5 % IV SOLN
20.0000 mg/h | INTRAVENOUS | Status: DC
  Administered 2013-09-10: 5 mg/h via INTRAVENOUS
  Administered 2013-09-11: 15 mg/h via INTRAVENOUS
  Administered 2013-09-11 – 2013-09-14 (×14): 20 mg/h via INTRAVENOUS
  Filled 2013-09-10 (×16): qty 100

## 2013-09-10 MED ORDER — ADENOSINE 6 MG/2ML IV SOLN
INTRAVENOUS | Status: AC
  Filled 2013-09-10: qty 2

## 2013-09-10 MED ORDER — SODIUM CHLORIDE 0.9 % IV SOLN
250.0000 mL | Freq: Once | INTRAVENOUS | Status: AC
Start: 2013-09-10 — End: 2013-09-10
  Administered 2013-09-10: 250 mL via INTRAVENOUS

## 2013-09-10 MED ORDER — METOPROLOL TARTRATE 1 MG/ML IV SOLN
5.0000 mg | Freq: Once | INTRAVENOUS | Status: AC
  Administered 2013-09-10: 5 mg via INTRAVENOUS
  Filled 2013-09-10: qty 5

## 2013-09-10 MED ORDER — DILTIAZEM HCL 25 MG/5ML IV SOLN
10.0000 mg | Freq: Once | INTRAVENOUS | Status: AC
  Administered 2013-09-10: 10 mg via INTRAVENOUS

## 2013-09-10 MED ORDER — ADENOSINE 6 MG/2ML IV SOLN
6.0000 mg | Freq: Once | INTRAVENOUS | Status: AC
  Administered 2013-09-10: 6 mg via INTRAVENOUS
  Filled 2013-09-10: qty 2

## 2013-09-10 MED ORDER — SODIUM CHLORIDE 0.9 % IJ SOLN: 3.0000 mL | INTRAMUSCULAR | Status: DC | PRN

## 2013-09-10 MED ORDER — SODIUM CHLORIDE 0.9 % IV SOLN
INTRAVENOUS | Status: AC
  Administered 2013-09-10: 19:00:00 via INTRAVENOUS

## 2013-09-10 MED ORDER — DILTIAZEM HCL 25 MG/5ML IV SOLN
10.0000 mg | Freq: Once | INTRAVENOUS | Status: AC
  Administered 2013-09-10: 10 mg via INTRAVENOUS
  Filled 2013-09-10: qty 5

## 2013-09-10 MED ORDER — HEPARIN SOD (PORK) LOCK FLUSH 100 UNIT/ML IV SOLN: 500.0000 [IU] | Freq: Every day | INTRAVENOUS | Status: DC | PRN

## 2013-09-10 NOTE — Progress Notes (Signed)
Hematology and Oncology Follow Up Visit  Brian Bowen [id number] [birth year] 63 y.o. 09/10/2013   Principle Diagnosis:   Myelofibrosis-JAK2 positive  Anemia secondary to myelofibrosis and possibly medication  Current Therapy:    #1 danazol 400 mg by mouth daily.  #2 prednisone 20 mg by mouth daily. #3 Jakafi 10 mg by mouth daily    Interim History:  Brian Bowen is in for followup. We last saw him back in January. He did get transfused prior to going on vacation in [place]. He had a good time down there.  He says that on January 29, he began to note some palpitations. He kept working. He was not symptomatic with weakness, diaphoresis, chest pain, shortness of breath, nausea vomiting, headaches.  He says that he's been feeling pretty good. He's had no abdominal pain. He's had no change in bowel bladder habits. He says that the chronic edema in his legs actually might be a little bit better.  When he got to the office, his heart rate on initial evaluation was 130. We did get an EKG. This, unfortunately, showed marked tachycardia which is likely atrial flutter. The heart rate was 160.  He has no obvious cardiac history. He does not smoke. There is no obvious thyroid issues.  Medications: Current outpatient prescriptions:aspirin 81 MG tablet, Take 81 mg by mouth daily after breakfast. , Disp: , Rfl: ;  danazol (DANOCRINE) 200 MG capsule, Take 1 capsule (200 mg total) by mouth 3 (three) times daily., Disp: 90 capsule, Rfl: 1;  febuxostat (ULORIC) 40 MG tablet, Take 40 mg by mouth daily with breakfast., Disp: , Rfl: ;  omeprazole (PRILOSEC) 20 MG capsule, Take 20 mg by mouth daily. , Disp: , Rfl:  predniSONE (DELTASONE) 20 MG tablet, Take 1 tablet (20 mg total) by mouth daily with breakfast., Disp: 30 tablet, Rfl: 4;  ruxolitinib phosphate (JAKAFI) 5 MG tablet, Take 5 mg by mouth 2 (two) times daily., Disp: , Rfl:   Allergies:  Allergies  Allergen Reactions  . Ace Inhibitors     Past  Medical History, Surgical history, Social history, and Family History were reviewed and updated.  Review of Systems: As above  Physical Exam:  height is 5\' 8"  (1.727 m) and weight is 181 lb (82.101 kg). His oral temperature is 99.3 F (37.4 C). His blood pressure is 132/74 and his pulse is 130. His respiration is 20.   Lungs are clear. Cardiac exam tachycardic but regular. Abdomen is soft. Spleen tip is about 3 cm below the left costal margin. There is no ascites. There is no hepatomegaly. Extremities shows some trace edema in his legs. Skin exam no rashes. Neurological exam no focal neurological deficits.  Lab Results  Component Value Date   WBC 10.6* 09/10/2013   HGB 6.7* 09/10/2013   HCT 22.4* 09/10/2013   MCV 96 09/10/2013   PLT 338 09/10/2013     Chemistry      Component Value Date/Time   NA 141 09/10/2013 1426   NA 140 08/05/2013 1427   NA 141 07/16/2013 0828   K 4.7 09/10/2013 1426   K 4.5 08/05/2013 1427   K 4.1 07/16/2013 0828   CL 106 09/10/2013 1426   CL 107 08/05/2013 1427   CO2 31 09/10/2013 1426   CO2 26 08/05/2013 1427   CO2 22 07/16/2013 0828   BUN 18 09/10/2013 1426   BUN 17 08/05/2013 1427   BUN 15.1 07/16/2013 0828   CREATININE 1.3* 09/10/2013 1426   CREATININE  1.12 08/05/2013 1427   CREATININE 1.2 07/16/2013 0828      Component Value Date/Time   CALCIUM 8.6 09/10/2013 1426   CALCIUM 8.5 08/05/2013 1427   CALCIUM 8.8 07/16/2013 0828   ALKPHOS 29 09/10/2013 1426   ALKPHOS 78 08/05/2013 1427   ALKPHOS 115 07/16/2013 0828   AST 18 09/10/2013 1426   AST 16 08/05/2013 1427   AST 18 07/16/2013 0828   ALT 19 09/10/2013 1426   ALT 15 08/05/2013 1427   ALT 15 07/16/2013 0828   BILITOT 0.60 09/10/2013 1426   BILITOT 0.7 08/05/2013 1427   BILITOT 0.84 07/16/2013 0828         Impression and Plan: Brian Bowen is a 63 year old gentleman with myelofibrosis. He has marked anemia. We are trying to improve the anemia with danazol and prednisone. His hemoglobin is 6.7 which for him is pretty  good.  Unfortunately, it looks like he has atrial flutter. I just don't think we can let him go home.  I spoke with one of the cardiologist I know. He recommended that he go to the emergency room and possibly be admitted for IV therapy to try to break the tachycardia.  I spoke with Brian Bowen at length. He is not too happy about having to go to the emergency room but understands why he needs to.  If his admitted, I will find him in the hospital.  We'll try to get him back here to see [place] in another couple of weeks or so.  From my point of view, I would not transfuse him. I don't think the tachycardia is because of his anemia.  Volanda Napoleon, MD 2/12/20154:58 PM

## 2013-09-10 NOTE — ED Notes (Signed)
Report called to Michelle RN

## 2013-09-10 NOTE — ED Notes (Signed)
Was seen from [name] office with a heart rate in the 150's per pt. He is not symptomatic. States he knows the exact time of onset 2 weeks ago while on vacation.

## 2013-09-10 NOTE — ED Provider Notes (Signed)
CSN: [id number]     Arrival date & time 09/10/13  1650 History   First MD Initiated Contact with Patient 09/10/13 1705     Chief Complaint  Patient presents with  . Tachycardia     (Consider location/radiation/quality/duration/timing/severity/associated sxs/prior Treatment) HPI Comments: Patient presents from oncology office with tachycardia. Patient states he's been feeling his heart racing since January 29. He denies any chest pain or shortness of breath. His history of myelofibrosis with chronic anemia. His hemoglobin today is 6.7 which for him is good by report. Dr. Marin Olp did not think is anemia was the cause of his tachycardia. No history of atrial fibrillation. No cardiac history.  The history is provided by the patient. The history is limited by the condition of the patient.    Past Medical History  Diagnosis Date  . 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 Tonsillectomy      age 63 years old  . Back surgery      #6 upper back, nerve cut  . Left arm      broken-plate from elbow down   No family history on file. History  Substance Use Topics  . Smoking status: Former Smoker -- 0.25 packs/day for 13 years    Types: Cigarettes    Start date: 04/21/1971    Quit date: 01/19/1984  . Smokeless tobacco: Never Used  . Alcohol Use: Yes    Review of Systems  Constitutional: Negative for fever, activity change and appetite change.  Eyes: Negative for visual disturbance.  Respiratory: Negative for cough, chest tightness and shortness of breath.   Cardiovascular: Positive for palpitations. Negative for chest pain.  Gastrointestinal: Negative for nausea, vomiting and abdominal pain.  Genitourinary: Negative for dysuria and hematuria.  Musculoskeletal: Negative for arthralgias, back pain and myalgias.  Skin: Negative for rash.  Neurological: Negative for dizziness and headaches.  A complete 10 system review of  systems was obtained and all systems are negative except as noted in the HPI and PMH.      Allergies  Ace inhibitors  Home Medications   No current outpatient prescriptions on file. BP 101/77  Pulse 155  Temp(Src) 98.1 F (36.7 C) (Oral)  Resp 25  Ht 5\' 11"  (1.803 m)  Wt 181 lb 3.5 oz (82.2 kg)  BMI 25.29 kg/m2  SpO2 100% Physical Exam  Constitutional: He is oriented to person, place, and time. He appears well-developed and well-nourished. No distress.  HENT:  Head: Normocephalic and atraumatic.  Mouth/Throat: Oropharynx is clear and moist. No oropharyngeal exudate.  Eyes: Conjunctivae and EOM are normal. Pupils are equal, round, and reactive to light.  Neck: Normal range of motion. Neck supple.  Cardiovascular: Normal rate and normal heart sounds.   No murmur heard. Regular tachycardia  Pulmonary/Chest: Effort normal and breath sounds normal. No respiratory distress.  Abdominal: Soft. There is no tenderness. There is no rebound and no guarding.  Musculoskeletal: Normal range of motion. He exhibits no edema and no tenderness.  Neurological: He is alert and oriented to person, place, and time. No cranial nerve deficit. He exhibits normal muscle tone. Coordination normal.  Skin: Skin is warm.    ED Course  CARDIOVERSION Date/Time: 09/10/2013 6:44 PM Performed by: Ezequiel Essex Authorized by: Ezequiel Essex Consent: Verbal consent obtained. Risks and benefits: risks, benefits and alternatives were discussed Consent given by: patient Patient understanding: patient states understanding of the procedure being performed Patient identity confirmed: verbally with patient  and provided demographic data Time out: Immediately prior to procedure a "time out" was called to verify the correct patient, procedure, equipment, support staff and site/side marked as required. Patient sedated: no Cardioversion basis: emergent Pre-procedure rhythm: atrial flutter Patient position:  patient was placed in a supine position Chest area: chest area exposed Post-procedure rhythm: atrial flutter Complications: no complications Patient tolerance: Patient tolerated the procedure well with no immediate complications. Comments: Chemical cardioversion with adenosine   (including critical care time) Labs Review Labs Reviewed  CBC WITH DIFFERENTIAL - Abnormal; Notable for the following:    WBC 12.1 (*)    RBC 2.44 (*)    Hemoglobin 7.0 (*)    HCT 23.4 (*)    MCHC 29.9 (*)    RDW 22.3 (*)    Neutrophils Relative % 86 (*)    Lymphocytes Relative 7 (*)    Neutro Abs 10.5 (*)    All other components within normal limits  COMPREHENSIVE METABOLIC PANEL - Abnormal; Notable for the following:    Glucose, Bld 102 (*)    Creatinine, Ser 1.40 (*)    Alkaline Phosphatase 33 (*)    GFR calc non Af Amer 53 (*)    GFR calc Af Amer 61 (*)    All other components within normal limits  PRO B NATRIURETIC PEPTIDE - Abnormal; Notable for the following:    Pro B Natriuretic peptide (BNP) 3623.0 (*)    All other components within normal limits  MRSA PCR SCREENING  PROTIME-INR  TROPONIN I   Imaging Review Dg Chest Portable 1 View  09/10/2013   CLINICAL DATA:  Tachycardia, cough  EXAM: PORTABLE CHEST - 1 VIEW  COMPARISON:  None.  FINDINGS: The heart size and mediastinal contours are within normal limits. Both lungs are clear. The visualized skeletal structures are unremarkable.  IMPRESSION: No active disease.   Electronically Signed   By: Inez Catalina M.D.   On: 09/10/2013 17:30      MDM   Final diagnoses:  Atrial flutter with rapid ventricular response    Palpitations for the past 2 weeks without chest pain or shortness of breath. EKG appears to have atrial flutter waves.  No change with cardizem. Rate remains 160. Will try adenosine  Adenosine given with decrease in rate transiently into the 90s with obvious flutter waves. Rate immediately increased into the 150s and  160s cardizem bolused again and gtt restarted.  Discussed with Dr. Meda Coffee of cardiology who accepts patient to step down admission. She recommends continuing Cardizem drip at current rate. We'll try IV Lopressor as well. Patient's hemoglobin is 7 which is good for him. Dr. Marin Olp did not think the tachycardia was secondary to his anemia. He continues to deny chest pain or shortness of breath.   Date: 09/11/2013  Rate: 155  Rhythm: sinus tachycardia  QRS Axis: normal  Intervals: normal  ST/T Wave abnormalities: nonspecific ST/T changes  Conduction Disutrbances:none  Narrative Interpretation:   Old EKG Reviewed: none available    CRITICAL CARE Performed by: Ezequiel Essex Total critical care time: 40 Critical care time was exclusive of separately billable procedures and treating other patients. Critical care was necessary to treat or prevent imminent or life-threatening deterioration. Critical care was time spent personally by me on the following activities: development of treatment plan with patient and/or surrogate as well as nursing, discussions with consultants, evaluation of patient's response to treatment, examination of patient, obtaining history from patient or surrogate, ordering and performing treatments and interventions, ordering  and review of laboratory studies, ordering and review of radiographic studies, pulse oximetry and re-evaluation of patient's condition.   Ezequiel Essex, MD 09/11/13 [phone]

## 2013-09-10 NOTE — ED Notes (Signed)
Pt. Takes med Brian Bowen that will have to come from his home.

## 2013-09-10 NOTE — ED Notes (Signed)
Pt. Became weak pale and diaphoretic at 1911.   Layed Pt. Down and made EDP aware.

## 2013-09-10 NOTE — ED Notes (Signed)
Pt. Has had crackers and drink and reports no longer mild headache.

## 2013-09-10 NOTE — ED Notes (Signed)
Dr. Wyvonnia Dusky has been in to see Pt. After RN called for him to see Pt.

## 2013-09-11 ENCOUNTER — Observation Stay (HOSPITAL_COMMUNITY): Payer: BC Managed Care – PPO

## 2013-09-11 ENCOUNTER — Encounter (HOSPITAL_COMMUNITY): Payer: Self-pay | Admitting: Radiology

## 2013-09-11 DIAGNOSIS — D7581 Myelofibrosis: Secondary | ICD-10-CM

## 2013-09-11 DIAGNOSIS — I4892 Unspecified atrial flutter: Principal | ICD-10-CM

## 2013-09-11 DIAGNOSIS — I509 Heart failure, unspecified: Secondary | ICD-10-CM

## 2013-09-11 DIAGNOSIS — D649 Anemia, unspecified: Secondary | ICD-10-CM

## 2013-09-11 DIAGNOSIS — D474 Osteomyelofibrosis: Secondary | ICD-10-CM

## 2013-09-11 LAB — CBC
HCT: 21.1 % — ABNORMAL LOW (ref 39.0–52.0)
Hemoglobin: 6.7 g/dL — CL (ref 13.0–17.0)
MCH: 29 pg (ref 26.0–34.0)
MCHC: 31.8 g/dL (ref 30.0–36.0)
MCV: 91.3 fL (ref 78.0–100.0)
PLATELETS: 303 10*3/uL (ref 150–400)
RBC: 2.31 MIL/uL — AB (ref 4.22–5.81)
RDW: 22.7 % — ABNORMAL HIGH (ref 11.5–15.5)
WBC: 9.9 10*3/uL (ref 4.0–10.5)

## 2013-09-11 LAB — TSH: TSH: 2.348 u[IU]/mL (ref 0.350–4.500)

## 2013-09-11 LAB — IRON AND TIBC CHCC
%SAT: 25 % (ref 20–55)
IRON: 89 ug/dL (ref 42–163)
TIBC: 359 ug/dL (ref 202–409)
UIBC: 270 ug/dL (ref 117–376)

## 2013-09-11 LAB — HEPARIN LEVEL (UNFRACTIONATED): Heparin Unfractionated: <0.1 IU/mL — ABNORMAL LOW (ref 0.30–0.70)

## 2013-09-11 LAB — FERRITIN CHCC

## 2013-09-11 LAB — HOLD TUBE, BLOOD BANK - CHCC SATELLITE

## 2013-09-11 LAB — MAGNESIUM: Magnesium: 2.4 mg/dL (ref 1.5–2.5)

## 2013-09-11 MED ORDER — DEXTROSE 5 % IV SOLN
2000.0000 mg | Freq: Two times a day (BID) | INTRAVENOUS | Status: DC
  Administered 2013-09-11 – 2013-09-14 (×8): 2000 mg via INTRAVENOUS
  Filled 2013-09-11 (×13): qty 2

## 2013-09-11 MED ORDER — PANTOPRAZOLE SODIUM 40 MG PO TBEC
40.0000 mg | DELAYED_RELEASE_TABLET | Freq: Every day | ORAL | Status: DC
  Administered 2013-09-11 – 2013-09-15 (×5): 40 mg via ORAL
  Filled 2013-09-11 (×6): qty 1

## 2013-09-11 MED ORDER — RUXOLITINIB PHOSPHATE 10 MG PO TABS
5.0000 mg | ORAL_TABLET | Freq: Two times a day (BID) | ORAL | Status: DC
  Administered 2013-09-11 (×2): 5 mg via ORAL
  Filled 2013-09-11 (×5): qty 1

## 2013-09-11 MED ORDER — IOHEXOL 350 MG/ML SOLN
100.0000 mL | Freq: Once | INTRAVENOUS | Status: AC | PRN
  Administered 2013-09-11: 100 mL via INTRAVENOUS

## 2013-09-11 MED ORDER — RIVAROXABAN 20 MG PO TABS
20.0000 mg | ORAL_TABLET | Freq: Every day | ORAL | Status: DC
  Administered 2013-09-11 – 2013-09-13 (×3): 20 mg via ORAL
  Filled 2013-09-11 (×5): qty 1

## 2013-09-11 MED ORDER — RUXOLITINIB PHOSPHATE 5 MG PO TABS: 5.0000 mg | ORAL_TABLET | Freq: Two times a day (BID) | ORAL | Status: DC

## 2013-09-11 MED ORDER — GUAIFENESIN 100 MG/5ML PO SYRP
400.0000 mg | ORAL_SOLUTION | Freq: Four times a day (QID) | ORAL | Status: DC | PRN
  Administered 2013-09-11 – 2013-09-14 (×6): 400 mg via ORAL
  Filled 2013-09-11 (×8): qty 20

## 2013-09-11 MED ORDER — FEBUXOSTAT 40 MG PO TABS
40.0000 mg | ORAL_TABLET | Freq: Every day | ORAL | Status: DC
  Administered 2013-09-11 – 2013-09-14 (×4): 40 mg via ORAL
  Filled 2013-09-11 (×6): qty 1

## 2013-09-11 MED ORDER — HEPARIN (PORCINE) IN NACL 100-0.45 UNIT/ML-% IJ SOLN
1200.0000 [IU]/h | INTRAMUSCULAR | Status: DC
  Administered 2013-09-11: 1200 [IU]/h via INTRAVENOUS
  Filled 2013-09-11: qty 250

## 2013-09-11 MED ORDER — DANAZOL 200 MG PO CAPS
200.0000 mg | ORAL_CAPSULE | Freq: Three times a day (TID) | ORAL | Status: DC
  Administered 2013-09-11 – 2013-09-15 (×10): 200 mg via ORAL
  Filled 2013-09-11 (×4): qty 1

## 2013-09-11 MED ORDER — PREDNISONE 20 MG PO TABS
20.0000 mg | ORAL_TABLET | Freq: Every day | ORAL | Status: DC
  Administered 2013-09-11 – 2013-09-15 (×4): 20 mg via ORAL
  Filled 2013-09-11 (×7): qty 1

## 2013-09-11 MED ORDER — FUROSEMIDE 10 MG/ML IJ SOLN
20.0000 mg | Freq: Once | INTRAMUSCULAR | Status: AC
  Administered 2013-09-11: 20 mg via INTRAVENOUS
  Filled 2013-09-11: qty 2

## 2013-09-11 MED ORDER — ASPIRIN 81 MG PO CHEW
81.0000 mg | CHEWABLE_TABLET | Freq: Every day | ORAL | Status: DC
  Administered 2013-09-11: 81 mg via ORAL
  Filled 2013-09-11: qty 1

## 2013-09-11 MED ORDER — DIPHENHYDRAMINE HCL 25 MG PO CAPS
25.0000 mg | ORAL_CAPSULE | Freq: Four times a day (QID) | ORAL | Status: DC | PRN
  Administered 2013-09-11 – 2013-09-14 (×2): 25 mg via ORAL
  Filled 2013-09-11 (×2): qty 1

## 2013-09-11 MED ORDER — ACETAMINOPHEN 325 MG PO TABS
650.0000 mg | ORAL_TABLET | Freq: Four times a day (QID) | ORAL | Status: DC | PRN
  Administered 2013-09-11 – 2013-09-13 (×5): 650 mg via ORAL
  Filled 2013-09-11 (×5): qty 2

## 2013-09-11 MED ORDER — HEPARIN BOLUS VIA INFUSION
2000.0000 [IU] | Freq: Once | INTRAVENOUS | Status: AC
  Administered 2013-09-11: 2000 [IU] via INTRAVENOUS
  Filled 2013-09-11: qty 2000

## 2013-09-11 NOTE — Progress Notes (Signed)
I am very grateful for the outstanding care that Brian Bowen is getting on [facility]. I very much appreciate cardiology is an expert intervention.  He is on a diltiazem drip. Heart rate is still about 160. It looks like he has a little bit of heart failure. His pro BNP was quite elevated.  His apparently is going for cardiac cath and ablation. His outlet she may have a TEE done also.  His not short of breath. He's not febrile.  Get a CT and she'll of the chest which was negative for pulmonary embolism.  No labs are back yet today.  I know that there is some element of iron overload. His last ferritin was 847. I don't think i that he has cardiac iron deposition that could cause this. I do not that with myelofibrosis, there might be some cardiac effects.  I am going to put him on some Desferal while he is in the hospital just to try to take out some iron.  I don't think he needs to be transfused right now. Again this might be something to think about depending on his lab work.   he is afebrile. Blood pressure 113/60.  I don't find anything new on his physical exam from when I saw him yesterday.  For now, I will certainly try to help manage his blood counts.  He is on [place]. He is on danazol and prednisone to try to help with his anemia.  Again, I think the cardiology staff and [facility] staff for his outstanding care.  Joylene John 6:9

## 2013-09-11 NOTE — Consult Note (Signed)
ELECTROPHYSIOLOGY CONSULT NOTE    Patient ID: Brian Bowen MRN: [id number], DOB/AGE: 01-29-1951 63 y.o.  Admit date: 09/10/2013 Date of Consult: 09-11-2013  Primary Physician: Brian Napoleon, MD Primary Cardiologist: new to [facility]  Reason for Consultation: atrial flutter  HPI:  Brian Bowen is a 63 y.o. male with a past medical history significant for myelofibrosis associated with significant anemia. He is followed by Dr Brian Bowen at the cancer center.  Otherwise, he has no health problems.  He recently went to [place] on vacation with his wife.  On 08-27-2013 he noticed palpitations and felt that his heart was out of rhythm.  He had no associated shortness of breath or chest pain. He thinks he has been out of rhythm since that date.  He went to see Dr Brian Bowen yesterday for routine follow up and was found to be in atrial flutter with RVR. He was sent to [facility] for further evaluation.  He has been placed on a Diltiazem and heparin drip.  EP has been asked to evaluate for treatment options.   Echo is pending this admission.  TSH is pending.  Lab work is notable for Hgb of 7 (stable for patient), WBC 12.1, creat 1.4, proBNP 3623, negative cardiac enzymes.    Today he denies chest pain, shortness of breath, or dizziness.  He recognizes that his heart rate is fast but feels well.    ROS is negative except as outlined above.    Past Medical History  Diagnosis Date  . 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 Tonsillectomy      age 60 years old  . Back surgery      #6 upper back, nerve cut  . Left arm      broken-plate from elbow down     Prescriptions prior to admission  Medication Sig Dispense Refill  . aspirin 81 MG tablet Take 81 mg by mouth daily after breakfast.       . Chlorphen-Phenyleph-ASA (ALKA-SELTZER PLUS COLD) 2-7.8-325 MG TBEF Take 1 capsule by mouth daily as needed (for  nasal drainage).      . danazol (DANOCRINE) 200 MG capsule Take 1 capsule (200 mg total) by mouth 3 (three) times daily.  90 capsule  1  . diphenhydrAMINE (BENADRYL) 25 mg capsule Take 25 mg by mouth every 6 (six) hours as needed (for nasal drainage).      . febuxostat (ULORIC) 40 MG tablet Take 40 mg by mouth at bedtime.       Marland Kitchen omeprazole (PRILOSEC) 20 MG capsule Take 20 mg by mouth every morning.       . predniSONE (DELTASONE) 20 MG tablet Take 1 tablet (20 mg total) by mouth daily with breakfast.  30 tablet  4  . ruxolitinib phosphate (JAKAFI) 5 MG tablet Take 5 mg by mouth 2 (two) times daily.        Inpatient Medications:  . aspirin  81 mg Oral QPC breakfast  . danazol  200 mg Oral TID  . deferoxamine (DESFERAL) IV  2,000 mg Intravenous Q12H  . febuxostat  40 mg Oral QHS  . pantoprazole  40 mg Oral Daily  . predniSONE  20 mg Oral Q breakfast  . ruxolitinib phosphate  5 mg Oral BID    Allergies:  Allergies  Allergen Reactions  . Ace Inhibitors Other (See Comments)    unknown    History   Social  History  . Marital Status: Married    Spouse Name: N/A    Number of Children: N/A  . Years of Education: N/A   Occupational History  . Not on file.   Social History Main Topics  . Smoking status: Former Smoker -- 0.25 packs/day for 13 years    Types: Cigarettes    Start date: 04/21/1971    Quit date: 01/19/1984  . Smokeless tobacco: Never Used  . Alcohol Use: Yes  . Drug Use: No  . Sexual Activity: Not on file   Other Topics Concern  . Not on file   Social History Narrative  . No narrative on file     No family history on file.   Physical Exam: Filed Vitals:   09/11/13 0330 09/11/13 0458 09/11/13 0829 09/11/13 1236  BP: 113/60  121/65 115/66  Pulse: 157  160 164  Temp: 98.3 F (36.8 C)  98.4 F (36.9 C) 98.3 F (36.8 C)  TempSrc: Oral  Oral Oral  Resp: 25  16 24   Height:      Weight:  182 lb 1.6 oz (82.6 kg)    SpO2: 100%  100% 100%    GEN- The  patient is well appearing, alert and oriented x 3 today.   Head- normocephalic, atraumatic Eyes-  Sclera clear, conjunctiva pink Ears- hearing intact Oropharynx- clear Neck- supple, no JVP Lymph- no cervical lymphadenopathy Lungs- Clear to ausculation bilaterally, normal work of breathing Heart- tachy irregular rhythm, no murmurs, rubs or gallops, PMI not laterally displaced GI- soft, NT, ND, + BS Extremities- no clubbing, cyanosis, +1 edema MS- no significant deformity or atrophy Skin- no rash or lesion Psych- euthymic mood, full affect Neuro- strength and sensation are intact   Labs:   Lab Results  Component Value Date   WBC 12.1* 09/10/2013   HGB 7.0* 09/10/2013   HCT 23.4* 09/10/2013   MCV 95.9 09/10/2013   PLT 378 09/10/2013    Recent Labs Lab 09/10/13 1719  NA 140  K 5.1  CL 102  CO2 22  BUN 20  CREATININE 1.40*  CALCIUM 8.7  PROT 7.2  BILITOT 0.5  ALKPHOS 33*  ALT 23  AST 15  GLUCOSE 102*   Lab Results  Component Value Date   TROPONINI <0.30 09/10/2013    Radiology/Studies: Ct Angio Chest Pe W/cm &/or Wo Cm 09/11/2013   CLINICAL DATA:  Elevated heart rate. New onset atrial flutter. Pulmonary embolism.  EXAM: CT ANGIOGRAPHY CHEST WITH CONTRAST  TECHNIQUE: Multidetector CT imaging of the chest was performed using the standard protocol during bolus administration of intravenous contrast. Multiplanar CT image reconstructions and MIPs were obtained to evaluate the vascular anatomy.  CONTRAST:  17mL OMNIPAQUE IOHEXOL 350 MG/ML SOLN  COMPARISON:  DG CHEST 1V PORT dated 09/10/2013  FINDINGS: Negative for pulmonary embolism. No aortic abnormality. No mediastinal or hilar adenopathy. No pericardial effusion is present. Incidental imaging the upper abdomen is grossly within normal limits. Small bilateral pleural effusions layer posteriorly. Airspace disease is present at the lung bases bilaterally, along with interlobular septal thickening and thickening of the fissures and  pulmonary ligaments. This is compatible with pulmonary edema and CHF. Calcified granuloma is present in the right upper lobe measuring 9 mm (image 76 series 6). There are no aggressive osseous lesions. Thoracic spondylosis. Lower lobe bronchi a demonstrate thickening, probably secondary to volume overload and pulmonary edema. Bronchitis can produce this appearance as well.  Review of the MIP images confirms the above findings.  IMPRESSION:  1. Negative for pulmonary embolism or acute aortic abnormality. 2. Small bilateral pleural effusions. Septal thickening and ground-glass attenuation at the bases is most compatible with pulmonary edema. The findings suggest mild CHF.   Electronically Signed   By: Dereck Ligas M.D.   On: 09/11/2013 04:29   Dg Chest Portable 1 View 09/10/2013   CLINICAL DATA:  Tachycardia, cough  EXAM: PORTABLE CHEST - 1 VIEW  COMPARISON:  None.  FINDINGS: The heart size and mediastinal contours are within normal limits. Both lungs are clear. The visualized skeletal structures are unremarkable.  IMPRESSION: No active disease.   Electronically Signed   By: Inez Catalina M.D.   On: 09/10/2013 17:30   EKG: typical appearing atrial flutter with 2:1 conduction  TELEMETRY: atrial flutter with ventricular rates 150's  Assessment and Plan:  1. Typical appearing atrial flutter Minimally symptomatic but with difficult to control V rates despite IV cardizem I have spoken with Dr Brian Bowen who knows the patient well.  He thinks that xarelto is reasonable. I will therefore stop heparin drip and start xarelto 20mg  daily (CrCl 64). Will schedule for TEE guided atrial flutter ablation on Monday after 5 half lives of xarelto have been given. Will rate control over the weekend  2. Anemia Stable Per Dr Brian Bowen, would not plan to transfuse unless Hb is less than 6  3. Edema Stable Echo pending

## 2013-09-11 NOTE — Progress Notes (Addendum)
error 

## 2013-09-11 NOTE — Progress Notes (Signed)
ANTICOAGULATION CONSULT NOTE - Initial Consult  Pharmacy Consult for heparin Indication: Aflutter w/ RVR  Allergies  Allergen Reactions  . Ace Inhibitors Other (See Comments)    unknown    Patient Measurements: Height: 5\' 11"  (180.3 cm) Weight: 181 lb 3.5 oz (82.2 kg) IBW/kg (Calculated) : 75.3  Vital Signs: Temp: 98.1 F (36.7 C) (02/12 2345) Temp src: Oral (02/12 2345) BP: 101/77 mmHg (02/13 0000) Pulse Rate: 155 (02/13 0000)  Labs:  Recent Labs  09/10/13 1426 09/10/13 1719  HGB 6.7* 7.0*  HCT 22.4* 23.4*  PLT 338 378  LABPROT  --  14.4  INR  --  1.14  CREATININE 1.3* 1.40*  TROPONINI  --  <0.30    Estimated Creatinine Clearance: 59 ml/min (by C-G formula based on Cr of 1.4).   Medical History: Past Medical History  Diagnosis Date  . 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 aspirin 81 MG tablet Take 81 mg by mouth daily after breakfast.       . Chlorphen-Phenyleph-ASA (ALKA-SELTZER PLUS COLD) 2-7.8-325 MG TBEF Take 1 capsule by mouth daily as needed (for nasal drainage).      . danazol (DANOCRINE) 200 MG capsule Take 1 capsule (200 mg total) by mouth 3 (three) times daily.  90 capsule  1  . diphenhydrAMINE (BENADRYL) 25 mg capsule Take 25 mg by mouth every 6 (six) hours as needed (for nasal drainage).      . febuxostat (ULORIC) 40 MG tablet Take 40 mg by mouth at bedtime.       Marland Kitchen omeprazole (PRILOSEC) 20 MG capsule Take 20 mg by mouth every morning.       . predniSONE (DELTASONE) 20 MG tablet Take 1 tablet (20 mg total) by mouth daily with breakfast.  30 tablet  4  . ruxolitinib phosphate (JAKAFI) 5 MG tablet Take 5 mg by mouth 2 (two) times daily.       Scheduled:  . sodium chloride   Intravenous STAT  . adenosine      . aspirin  81 mg Oral QPC breakfast  . danazol  200 mg Oral TID  . febuxostat  40 mg Oral QHS  . pantoprazole  40 mg Oral Daily  . predniSONE   20 mg Oral Q breakfast  . ruxolitinib phosphate  5 mg Oral BID   Infusions:  . diltiazem (CARDIZEM) infusion 15 mg/hr (09/10/13 2134)    Assessment: 63yo male was at hem/onc f/u visit where he c/o palpitations since late January, HR in office was up to 160s, EKG revealed likely Aflutter, sent to ED, now admitted for TEE and ablation; noted chronic anemia, thought to be safe by admitting MD for anticoagulation.  Goal of Therapy:  Heparin level 0.3-0.7 units/ml Monitor platelets by anticoagulation protocol: Yes   Plan:  Will give heparin 2000 units IV bolus x1 followed by gtt at 1200 units/hr and monitor heparin levels and CBC.  Wynona Neat, PharmD, BCPS  09/11/2013,3:27 AM

## 2013-09-11 NOTE — Progress Notes (Signed)
Subjective:  No CP/SOB  Objective:  Temp:  [98.1 F (36.7 C)-99.3 F (37.4 C)] 98.4 F (36.9 C) (02/13 0829) Pulse Rate:  [118-161] 160 (02/13 0829) Resp:  [16-26] 16 (02/13 0829) BP: (101-159)/(41-88) 121/65 mmHg (02/13 0829) SpO2:  [100 %] 100 % (02/13 0829) Weight:  [181 lb (82.101 kg)-182 lb 1.6 oz (82.6 kg)] 182 lb 1.6 oz (82.6 kg) (02/13 0458) Weight change:   Intake/Output from previous day: 02/12 0701 - 02/13 0700 In: 1009 [I.V.:1009] Out: 1815 [Urine:1815]  Intake/Output from this shift: Total I/O In: -  Out: 500 [Urine:500]  Physical Exam: General appearance: alert and no distress Neck: no adenopathy, no carotid bruit, no JVD, supple, symmetrical, trachea midline and thyroid not enlarged, symmetric, no tenderness/mass/nodules Lungs: clear to auscultation bilaterally Heart: regular rate and rhythm, S1, S2 normal, no murmur, click, rub or gallop and tachy Extremities: extremities normal, atraumatic, no cyanosis or edema Pulses: 2+ and symmetric  Lab Results: Results for orders placed during the hospital encounter of 09/10/13 (from the past 48 hour(s))  CBC WITH DIFFERENTIAL     Status: Abnormal   Collection Time    09/10/13  5:19 PM      Result Value Ref Range   WBC 12.1 (*) 4.0 - 10.5 K/uL   RBC 2.44 (*) 4.22 - 5.81 MIL/uL   Hemoglobin 7.0 (*) 13.0 - 17.0 g/dL   HCT 23.4 (*) 39.0 - 52.0 %   MCV 95.9  78.0 - 100.0 fL   MCH 28.7  26.0 - 34.0 pg   MCHC 29.9 (*) 30.0 - 36.0 g/dL   RDW 22.3 (*) 11.5 - 15.5 %   Platelets 378  150 - 400 K/uL   Neutrophils Relative % 86 (*) 43 - 77 %   Lymphocytes Relative 7 (*) 12 - 46 %   Monocytes Relative 7  3 - 12 %   Eosinophils Relative 0  0 - 5 %   Basophils Relative 0  0 - 1 %   Neutro Abs 10.5 (*) 1.7 - 7.7 K/uL   Lymphs Abs 0.8  0.7 - 4.0 K/uL   Monocytes Absolute 0.8  0.1 - 1.0 K/uL   Eosinophils Absolute 0.0  0.0 - 0.7 K/uL   Basophils Absolute 0.0  0.0 - 0.1 K/uL  COMPREHENSIVE METABOLIC PANEL      Status: Abnormal   Collection Time    09/10/13  5:19 PM      Result Value Ref Range   Sodium 140  137 - 147 mEq/L   Potassium 5.1  3.7 - 5.3 mEq/L   Chloride 102  96 - 112 mEq/L   CO2 22  19 - 32 mEq/L   Glucose, Bld 102 (*) 70 - 99 mg/dL   BUN 20  6 - 23 mg/dL   Creatinine, Ser 1.40 (*) 0.50 - 1.35 mg/dL   Calcium 8.7  8.4 - 10.5 mg/dL   Total Protein 7.2  6.0 - 8.3 g/dL   Albumin 3.6  3.5 - 5.2 g/dL   AST 15  0 - 37 U/L   ALT 23  0 - 53 U/L   Alkaline Phosphatase 33 (*) 39 - 117 U/L   Total Bilirubin 0.5  0.3 - 1.2 mg/dL   GFR calc non Af Amer 53 (*) >90 mL/min   GFR calc Af Amer 61 (*) >90 mL/min   Comment: (NOTE)     The eGFR has been calculated using the CKD EPI equation.     This  calculation has not been validated in all clinical situations.     eGFR's persistently <90 mL/min signify possible Chronic Kidney     Disease.  PROTIME-INR     Status: None   Collection Time    09/10/13  5:19 PM      Result Value Ref Range   Prothrombin Time 14.4  11.6 - 15.2 seconds   INR 1.14  0.00 - 1.49  TROPONIN I     Status: None   Collection Time    09/10/13  5:19 PM      Result Value Ref Range   Troponin I <0.30  <0.30 ng/mL   Comment:            Due to the release kinetics of cTnI,     a negative result within the first hours     of the onset of symptoms does not rule out     myocardial infarction with certainty.     If myocardial infarction is still suspected,     repeat the test at appropriate intervals.  PRO B NATRIURETIC PEPTIDE     Status: Abnormal   Collection Time    09/10/13  5:19 PM      Result Value Ref Range   Pro B Natriuretic peptide (BNP) 3623.0 (*) 0 - 125 pg/mL  MRSA PCR SCREENING     Status: None   Collection Time    09/10/13  9:30 PM      Result Value Ref Range   MRSA by PCR NEGATIVE  NEGATIVE   Comment:            The GeneXpert MRSA Assay (FDA     approved for NASAL specimens     only), is one component of a     comprehensive MRSA colonization      surveillance program. It is not     intended to diagnose MRSA     infection nor to guide or     monitor treatment for     MRSA infections.  MAGNESIUM     Status: None   Collection Time    09/11/13  5:15 AM      Result Value Ref Range   Magnesium 2.4  1.5 - 2.5 mg/dL    Imaging: Imaging results have been reviewed  Assessment/Plan:   1. Active Problems: 2.   Atrial flutter with rapid ventricular response 3.   Time Spent Directly with Patient:  20 minutes  Length of Stay:  LOS: 1 day   Pt with relatively new onset aflutter with RVR (approx 2 weeks), with myelofibrosis and chronic anemia. Agree with IV hep/dilt. Relatively asymptomatic. WIll ask EP service to see.  Lorretta Harp 09/11/2013, 8:50 AM

## 2013-09-11 NOTE — H&P (Addendum)
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 He presents from oncology office with tachycardia. Patient states he's been feeling his heart racing since August 27 2013.  when he went on vacation to [facility]. Patient continued with tachycardia through the hospital location. Also according to his oncologist notes he developed lower extremity edema prior to going on medication.    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 He remains asymptomatic with hemoglobin down to 5 g/dL and hemoglobin of 7 during current admission is actually high for him. He is being considered for stem cell transplant at the moment.   Patient denied active symptoms of chest pain, shortness of breath, PND orthopnea, nausea vomiting.  Review of Systems:  12 systems were reviewed and were negative except mentioned in history of present illness   Past Medical History: Past Medical History  Diagnosis Date  . 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 Tonsillectomy      age 71 years old  . Back surgery      #6 upper back, nerve cut  . Left arm      broken-plate from elbow down    Medications: Prior to Admission medications   Medication Sig Start Date End Date Taking? Authorizing Provider  aspirin 81 MG tablet Take 81 mg by mouth daily after breakfast.    Yes Historical Provider, MD  Chlorphen-Phenyleph-ASA (ALKA-SELTZER PLUS COLD) 2-7.8-325 MG TBEF Take 1 capsule by mouth daily as needed (for nasal drainage).   Yes Historical Provider, MD  danazol (DANOCRINE) 200 MG capsule Take 1 capsule (200 mg total) by mouth 3 (three) times daily. 08/21/13  Yes Volanda Napoleon, MD  diphenhydrAMINE (BENADRYL) 25 mg capsule Take 25 mg by mouth every 6 (six) hours as needed (for nasal drainage).   Yes  Historical Provider, MD  febuxostat (ULORIC) 40 MG tablet Take 40 mg by mouth at bedtime.  07/08/13  Yes Volanda Napoleon, MD  omeprazole (PRILOSEC) 20 MG capsule Take 20 mg by mouth every morning.    Yes Historical Provider, MD  predniSONE (DELTASONE) 20 MG tablet Take 1 tablet (20 mg total) by mouth daily with breakfast. 08/05/13  Yes Volanda Napoleon, MD  ruxolitinib phosphate (JAKAFI) 5 MG tablet Take 5 mg by mouth 2 (two) times daily. 07/14/13  Yes Volanda Napoleon, MD    Allergies:   Allergies  Allergen Reactions  . Ace Inhibitors Other (See Comments)    unknown    Social History:  reports that he quit smoking about 29 years ago. His smoking use included Cigarettes. He started smoking about 42 years ago. He has a 3.25 pack-year smoking history. He has never used smokeless tobacco. He reports that he drinks alcohol. He reports that he does not use illicit drugs. The patient is normally at baseline is still quite active and well compensated despite his anemia, he still works on a farm.   Family History: Reviewed, no family history of early cardiac or disease  PHYSICAL EXAM:  Filed Vitals:   09/10/13 1958 09/10/13 2110 09/10/13 2345 09/11/13 0000  BP: 120/53 126/71 105/76 101/77  Pulse:  158 118 155  Temp:  98.2 F (36.8 C) 98.1 F (36.7 C)   TempSrc:  Oral Oral   Resp:  24 26 25  Height:  5\' 11"  (1.803 m)    Weight:  82.2 kg (181 lb 3.5 oz)    SpO2:  100% 100% 100%   General:  Well appearing. No respiratory difficulty HEENT: normal Neck: supple. JVP 3 cm above the clavicle with patient signal.. Carotids 2+ bilat; no bruits. No lymphadenopathy or thryomegaly appreciated. Cor: PMI nondisplaced. Regular rate & rhythm. Tachycardia No rubs, gallops or murmurs. Lungs: Bilateral coarse breath sounds, occasional rhonchi Abdomen: soft, nontender, nondistended. No hepatosplenomegaly. No bruits or masses. Good bowel sounds. Extremities: no cyanosis, clubbing, rash, edema Neuro: alert  & oriented x 3, cranial nerves grossly intact. moves all 4 extremities w/o difficulty. Affect pleasant.  Labs on Admission:   Recent Labs  09/10/13 1426 09/10/13 1719  NA 141 140  K 4.7 5.1  CL 106 102  CO2 31 22  GLUCOSE 95 102*  BUN 18 20  CREATININE 1.3* 1.40*  CALCIUM 8.6 8.7    Recent Labs  09/10/13 1426 09/10/13 1719  AST 18 15  ALT 19 23  ALKPHOS 29 33*  BILITOT 0.60 0.5  PROT 6.7 7.2  ALBUMIN  --  3.6   No results found for this basename: LIPASE, AMYLASE,  in the last 72 hours  Recent Labs  09/10/13 1426 09/10/13 1719  WBC 10.6* 12.1*  NEUTROABS 9.1* 10.5*  HGB 6.7* 7.0*  HCT 22.4* 23.4*  MCV 96 95.9  PLT 338 378    Recent Labs  09/10/13 1719  TROPONINI <0.30   No results found for this basename: TSH, T4TOTAL, FREET3, T3FREE, THYROIDAB,  in the last 72 hours  Recent Labs  09/10/13 1426  RETICCTPCT 2.1    Radiological Exams on Admission (all images were personally reviewed and interpreted by me, radiology reports were reviewed as well): Dg Chest Portable 1 View Minimal bilateral pleural effusions, signs of vascular congestion   ECHO: Not available EKG personally reviewed and interpreted by me: Typical atrial flutter with negative P waves in inferior leads and positive P waves in V1, 2-1 conduction, heart rate ~ 150 beats per minute  Assessment/Plan Present on Admission:  . Atrial flutter with rapid ventricular response Typical flutter Patient is symptoms free despite continued a flutter with RVR.   TSH and magnesium levels N.p.o. and plan for TEE and flutter ablation in a.m. Start heparin drip (patient doesn't have any bleeding problems in his anemia solely secondary to myelofibrosis, he should be a reasonable candidate for at least 30 days anticoagulation post ablation) Given recent travel, bilateral lower extremity edema, history of myelofibrosis, new onset of atrial flutter we'll obtain CT chest PE protocol to rule out pulmonary  embolism  Continue diltiazem drip started in the ED  Congestive heart failure (LVEF unknown) Constilation of lower extremity edema, elevated proBNP, mild JVD, mild, congestion chest x-ray with physical exam on findings Tachycardia induced versus high output heart failure Lasix 20 mg IV times one   myelofibrosis Continue home medications  Shivaay Stormont 09/11/2013, 2:30 AM

## 2013-09-11 NOTE — Progress Notes (Signed)
Report of hgb of 6.7 reported to Dr. Rayann Heman.

## 2013-09-11 NOTE — Progress Notes (Signed)
UR completed. Patient changed to inpatient- requiring IV Cardizem gtt.

## 2013-09-12 DIAGNOSIS — I059 Rheumatic mitral valve disease, unspecified: Secondary | ICD-10-CM

## 2013-09-12 LAB — CBC
HCT: 18.6 % — ABNORMAL LOW (ref 39.0–52.0)
Hemoglobin: 5.9 g/dL — CL (ref 13.0–17.0)
MCH: 28.9 pg (ref 26.0–34.0)
MCHC: 31.7 g/dL (ref 30.0–36.0)
MCV: 91.2 fL (ref 78.0–100.0)
Platelets: 299 10*3/uL (ref 150–400)
RBC: 2.04 MIL/uL — ABNORMAL LOW (ref 4.22–5.81)
RDW: 22.3 % — AB (ref 11.5–15.5)
WBC: 9.2 10*3/uL (ref 4.0–10.5)

## 2013-09-12 LAB — PREPARE RBC (CROSSMATCH)

## 2013-09-12 LAB — ABO/RH: ABO/RH(D): O POS

## 2013-09-12 MED ORDER — FUROSEMIDE 10 MG/ML IJ SOLN
20.0000 mg | Freq: Once | INTRAMUSCULAR | Status: AC
  Administered 2013-09-12: 20 mg via INTRAVENOUS
  Filled 2013-09-12: qty 2

## 2013-09-12 MED ORDER — RUXOLITINIB PHOSPHATE 5 MG PO TABS
5.0000 mg | ORAL_TABLET | Freq: Two times a day (BID) | ORAL | Status: DC
  Administered 2013-09-12 – 2013-09-15 (×7): 5 mg via ORAL
  Filled 2013-09-12: qty 1

## 2013-09-12 NOTE — Progress Notes (Signed)
SUBJECTIVE: The patient is doing well today.  At this time, he denies chest pain, shortness of breath.  Does complain of post nasal drip and productive cough - WBC normal, no fever.   Hgb 5.9 this morning- threshold to transfuse 6 per Dr Marin Olp.   Cardizem drip currently at 20 for rate control.  Xarelto started yesterday after discussion with Dr Marin Olp.  Plan for TEE/flutter ablation on Monday   CURRENT MEDICATIONS: . danazol  200 mg Oral TID  . deferoxamine (DESFERAL) IV  2,000 mg Intravenous Q12H  . febuxostat  40 mg Oral QHS  . furosemide  20 mg Intravenous Once  . pantoprazole  40 mg Oral Daily  . predniSONE  20 mg Oral Q breakfast  . rivaroxaban  20 mg Oral Q supper  . ruxolitinib phosphate  5 mg Oral BID   . diltiazem (CARDIZEM) infusion 20 mg/hr (09/12/13 0515)    OBJECTIVE: Physical Exam: Filed Vitals:   09/11/13 1639 09/11/13 2044 09/11/13 2349 09/12/13 0453  BP: 122/66 124/65 105/54 103/44  Pulse: 141 131 110 74  Temp: 98.3 F (36.8 C) 98.8 F (37.1 C) 98.3 F (36.8 C) 98.5 F (36.9 C)  TempSrc: Oral Oral Oral Oral  Resp: 26 21 23 21   Height:      Weight:    182 lb 1.6 oz (82.6 kg)  SpO2: 100% 98% 100% 100%    Intake/Output Summary (Last 24 hours) at 09/12/13 0708 Last data filed at 09/12/13 0200  Gross per 24 hour  Intake    662 ml  Output   1175 ml  Net   -513 ml    Telemetry reveals atrial flutter, ventricular rates 70-100  GEN- The patient is well appearing, alert and oriented x 3 today.   Head- normocephalic, atraumatic Eyes-  Sclera clear, conjunctiva pink Ears- hearing intact Oropharynx- clear Neck- supple,   Lungs- few basilar rales, normal work of breathing Heart- irregular rate and rhythm  GI- soft, NT, ND, + BS Extremities- no clubbing, cyanosis, +1 edema Skin- no rash or lesion Psych- euthymic mood, full affect Neuro- strength and sensation are intact  LABS: Basic Metabolic Panel:  Recent Labs  09/10/13 1426 09/10/13 1719  09/11/13 0515  NA 141 140  --   K 4.7 5.1  --   CL 106 102  --   CO2 31 22  --   GLUCOSE 95 102*  --   BUN 18 20  --   CREATININE 1.3* 1.40*  --   CALCIUM 8.6 8.7  --   MG  --   --  2.4   Liver Function Tests:  Recent Labs  09/10/13 1426 09/10/13 1719  AST 18 15  ALT 19 23  ALKPHOS 29 33*  BILITOT 0.60 0.5  PROT 6.7 7.2  ALBUMIN  --  3.6   CBC:  Recent Labs  09/10/13 1426  09/10/13 1719 09/11/13 1050 09/12/13 0315  WBC 10.6*  < > 12.1* 9.9 9.2  NEUTROABS 9.1*  --  10.5*  --   --   HGB 6.7*  < > 7.0* 6.7* 5.9*  HCT 22.4*  < > 23.4* 21.1* 18.6*  MCV 96  < > 95.9 91.3 91.2  PLT 338  < > 378 303 299  < > = values in this interval not displayed. Cardiac Enzymes:  Recent Labs  09/10/13 1719  TROPONINI <0.30   Thyroid Function Tests:  Recent Labs  09/11/13 0515  TSH 2.348   Anemia Panel:  Recent Labs  09/10/13 1426  FERRITIN 1,321*  TIBC 359  IRON 89  RETICCTPCT 2.1    RADIOLOGY: Ct Angio Chest Pe W/cm &/or Wo Cm 09/11/2013   CLINICAL DATA:  Elevated heart rate. New onset atrial flutter. Pulmonary embolism.  EXAM: CT ANGIOGRAPHY CHEST WITH CONTRAST  TECHNIQUE: Multidetector CT imaging of the chest was performed using the standard protocol during bolus administration of intravenous contrast. Multiplanar CT image reconstructions and MIPs were obtained to evaluate the vascular anatomy.  CONTRAST:  130mL OMNIPAQUE IOHEXOL 350 MG/ML SOLN  COMPARISON:  DG CHEST 1V PORT dated 09/10/2013  FINDINGS: Negative for pulmonary embolism. No aortic abnormality. No mediastinal or hilar adenopathy. No pericardial effusion is present. Incidental imaging the upper abdomen is grossly within normal limits. Small bilateral pleural effusions layer posteriorly. Airspace disease is present at the lung bases bilaterally, along with interlobular septal thickening and thickening of the fissures and pulmonary ligaments. This is compatible with pulmonary edema and CHF. Calcified granuloma  is present in the right upper lobe measuring 9 mm (image 76 series 6). There are no aggressive osseous lesions. Thoracic spondylosis. Lower lobe bronchi a demonstrate thickening, probably secondary to volume overload and pulmonary edema. Bronchitis can produce this appearance as well.  Review of the MIP images confirms the above findings.  IMPRESSION: 1. Negative for pulmonary embolism or acute aortic abnormality. 2. Small bilateral pleural effusions. Septal thickening and ground-glass attenuation at the bases is most compatible with pulmonary edema. The findings suggest mild CHF.   Electronically Signed   By: Dereck Ligas M.D.   On: 09/11/2013 04:29   Dg Chest Portable 1 View 09/10/2013   CLINICAL DATA:  Tachycardia, cough  EXAM: PORTABLE CHEST - 1 VIEW  COMPARISON:  None.  FINDINGS: The heart size and mediastinal contours are within normal limits. Both lungs are clear. The visualized skeletal structures are unremarkable.  IMPRESSION: No active disease.   Electronically Signed   By: Inez Catalina M.D.   On: 09/10/2013 17:30    ASSESSMENT AND PLAN:  Active Problems:   Atrial flutter with rapid ventricular response  1. Atrial flutter Rates are better Continue xarelto Planned for TEE guided [facility] on Monday  2. Mild CHF Echo is pending Likely due to atrial flutter with RVR Will give lasix between prbcs today Will likely benefit from diuresis over the weekend  3. 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

## 2013-09-12 NOTE — Progress Notes (Signed)
Echocardiogram 2D Echocardiogram has been performed.  Brian Bowen 09/12/2013, 10:55 AM

## 2013-09-13 LAB — TYPE AND SCREEN
ABO/RH(D): O POS
ANTIBODY SCREEN: NEGATIVE
UNIT DIVISION: 0
Unit division: 0

## 2013-09-13 LAB — CBC
HEMATOCRIT: 23.3 % — AB (ref 39.0–52.0)
Hemoglobin: 7.7 g/dL — ABNORMAL LOW (ref 13.0–17.0)
MCH: 29.2 pg (ref 26.0–34.0)
MCHC: 33 g/dL (ref 30.0–36.0)
MCV: 88.3 fL (ref 78.0–100.0)
Platelets: 267 10*3/uL (ref 150–400)
RBC: 2.64 MIL/uL — ABNORMAL LOW (ref 4.22–5.81)
RDW: 19 % — AB (ref 11.5–15.5)
WBC: 5.4 10*3/uL (ref 4.0–10.5)

## 2013-09-13 MED ORDER — CHLORHEXIDINE GLUCONATE CLOTH 2 % EX PADS
6.0000 | MEDICATED_PAD | Freq: Once | CUTANEOUS | Status: AC
  Administered 2013-09-13: 6 via TOPICAL

## 2013-09-13 MED ORDER — FLUTICASONE PROPIONATE 50 MCG/ACT NA SUSP
1.0000 | Freq: Every day | NASAL | Status: DC
Start: 2013-09-13 — End: 2013-09-15
  Administered 2013-09-13 – 2013-09-15 (×3): 1 via NASAL
  Filled 2013-09-13: qty 16

## 2013-09-13 MED ORDER — SODIUM CHLORIDE 0.9 % IV SOLN: INTRAVENOUS | Status: DC

## 2013-09-13 MED ORDER — CHLORHEXIDINE GLUCONATE CLOTH 2 % EX PADS
6.0000 | MEDICATED_PAD | Freq: Once | CUTANEOUS | Status: AC
  Administered 2013-09-14: 6 via TOPICAL

## 2013-09-13 MED ORDER — FUROSEMIDE 10 MG/ML IJ SOLN
40.0000 mg | Freq: Once | INTRAMUSCULAR | Status: AC
  Administered 2013-09-13: 40 mg via INTRAVENOUS
  Filled 2013-09-13: qty 4

## 2013-09-13 NOTE — Progress Notes (Signed)
SUBJECTIVE: The patient is doing well today.  At this time, he denies chest pain, shortness of breath.  Does complain of post nasal drip and productive cough   Received PRBCs per Dr Marin Olp yesterday.  Cardizem drip currently at 20 for rate control.  Xarelto started Friday after discussion with Dr Marin Olp.  Plan for TEE/flutter ablation on Monday   CURRENT MEDICATIONS: . danazol  200 mg Oral TID  . deferoxamine (DESFERAL) IV  2,000 mg Intravenous Q12H  . febuxostat  40 mg Oral QHS  . pantoprazole  40 mg Oral Daily  . predniSONE  20 mg Oral Q breakfast  . rivaroxaban  20 mg Oral Q supper  . ruxolitinib phosphate  5 mg Oral BID   . diltiazem (CARDIZEM) infusion 20 mg/hr (09/13/13 0811)    OBJECTIVE: Physical Exam: Filed Vitals:   09/12/13 1959 09/13/13 0025 09/13/13 0341 09/13/13 0500  BP: 100/55 118/53 121/63   Pulse: 105 73 73   Temp: 98 F (36.7 C) 97.6 F (36.4 C) 98 F (36.7 C)   TempSrc: Oral Oral Oral   Resp: 19 22 21    Height:      Weight:    184 lb 8.4 oz (83.7 kg)  SpO2: 99% 100% 99%     Intake/Output Summary (Last 24 hours) at 09/13/13 0820 Last data filed at 09/13/13 0620  Gross per 24 hour  Intake 2826.67 ml  Output   1800 ml  Net 1026.67 ml    Telemetry reveals atrial flutter, ventricular rates 70-100  GEN- The patient is well appearing, alert and oriented x 3 today.   Head- normocephalic, atraumatic Eyes-  Sclera clear, conjunctiva pink Ears- hearing intact Oropharynx- clear Neck- supple,   Lungs- few basilar rales, normal work of breathing Heart- irregular rate and rhythm  GI- soft, NT, ND, + BS Extremities- no clubbing, cyanosis, +1 edema Skin- no rash or lesion Psych- euthymic mood, full affect Neuro- strength and sensation are intact  LABS: Basic Metabolic Panel:  Recent Labs  09/10/13 1426 09/10/13 1719 09/11/13 0515  NA 141 140  --   K 4.7 5.1  --   CL 106 102  --   CO2 31 22  --   GLUCOSE 95 102*  --   BUN 18 20  --     CREATININE 1.3* 1.40*  --   CALCIUM 8.6 8.7  --   MG  --   --  2.4   Liver Function Tests:  Recent Labs  09/10/13 1426 09/10/13 1719  AST 18 15  ALT 19 23  ALKPHOS 29 33*  BILITOT 0.60 0.5  PROT 6.7 7.2  ALBUMIN  --  3.6   CBC:  Recent Labs  09/10/13 1426 09/10/13 1719  09/12/13 0315 09/13/13 0354  WBC 10.6* 12.1*  < > 9.2 5.4  NEUTROABS 9.1* 10.5*  --   --   --   HGB 6.7* 7.0*  < > 5.9* 7.7*  HCT 22.4* 23.4*  < > 18.6* 23.3*  MCV 96 95.9  < > 91.2 88.3  PLT 338 378  < > 299 267  < > = values in this interval not displayed. Cardiac Enzymes:  Recent Labs  09/10/13 1719  TROPONINI <0.30   Thyroid Function Tests:  Recent Labs  09/11/13 0515  TSH 2.348   Anemia Panel:  Recent Labs  09/10/13 1426  FERRITIN 1,321*  TIBC 359  IRON 89  RETICCTPCT 2.1    RADIOLOGY: Ct Angio Chest Pe W/cm &/or Wo  Cm 09/11/2013   CLINICAL DATA:  Elevated heart rate. New onset atrial flutter. Pulmonary embolism.  EXAM: CT ANGIOGRAPHY CHEST WITH CONTRAST  TECHNIQUE: Multidetector CT imaging of the chest was performed using the standard protocol during bolus administration of intravenous contrast. Multiplanar CT image reconstructions and MIPs were obtained to evaluate the vascular anatomy.  CONTRAST:  166mL OMNIPAQUE IOHEXOL 350 MG/ML SOLN  COMPARISON:  DG CHEST 1V PORT dated 09/10/2013  FINDINGS: Negative for pulmonary embolism. No aortic abnormality. No mediastinal or hilar adenopathy. No pericardial effusion is present. Incidental imaging the upper abdomen is grossly within normal limits. Small bilateral pleural effusions layer posteriorly. Airspace disease is present at the lung bases bilaterally, along with interlobular septal thickening and thickening of the fissures and pulmonary ligaments. This is compatible with pulmonary edema and CHF. Calcified granuloma is present in the right upper lobe measuring 9 mm (image 76 series 6). There are no aggressive osseous lesions. Thoracic  spondylosis. Lower lobe bronchi a demonstrate thickening, probably secondary to volume overload and pulmonary edema. Bronchitis can produce this appearance as well.  Review of the MIP images confirms the above findings.  IMPRESSION: 1. Negative for pulmonary embolism or acute aortic abnormality. 2. Small bilateral pleural effusions. Septal thickening and ground-glass attenuation at the bases is most compatible with pulmonary edema. The findings suggest mild CHF.   Electronically Signed   By: Dereck Ligas M.D.   On: 09/11/2013 04:29   Dg Chest Portable 1 View 09/10/2013   CLINICAL DATA:  Tachycardia, cough  EXAM: PORTABLE CHEST - 1 VIEW  COMPARISON:  None.  FINDINGS: The heart size and mediastinal contours are within normal limits. Both lungs are clear. The visualized skeletal structures are unremarkable.  IMPRESSION: No active disease.   Electronically Signed   By: Inez Catalina M.D.   On: 09/10/2013 17:30    ASSESSMENT AND PLAN:  Active Problems:   Atrial flutter with rapid ventricular response  1. Atrial flutter Rates are better Continue xarelto Planned for TEE guided [facility] on Monday Risk, benefits, and alternatives to EP study and radiofrequency ablation were also discussed in detail today. These risks include but are not limited to stroke, bleeding, vascular damage, tamponade, perforation, damage to the heart and other structures, AV block requiring pacemaker, worsening renal function, and death. The patient understands these risk and wishes to proceed.   2. Mild CHF Echo is reviewed.  EF is normal Likely due to atrial flutter with RVR Will give lasix again today  3. 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 Post nasal drip/ cough Will start flonase

## 2013-09-14 ENCOUNTER — Encounter (HOSPITAL_COMMUNITY)
Admission: EM | Disposition: A | Discharge status: Home / Self Care | Payer: Self-pay | Source: Home / Self Care | Attending: Cardiology

## 2013-09-14 DIAGNOSIS — I359 Nonrheumatic aortic valve disorder, unspecified: Secondary | ICD-10-CM

## 2013-09-14 DIAGNOSIS — I4892 Unspecified atrial flutter: Secondary | ICD-10-CM

## 2013-09-14 HISTORY — PX: ATRIAL FLUTTER ABLATION: SHX5733

## 2013-09-14 HISTORY — PX: TEE WITHOUT CARDIOVERSION: SHX5443

## 2013-09-14 HISTORY — PX: ABLATION: SHX5711

## 2013-09-14 LAB — BASIC METABOLIC PANEL
BUN: 20 mg/dL (ref 6–23)
CO2: 22 mEq/L (ref 19–32)
Calcium: 8.6 mg/dL (ref 8.4–10.5)
Chloride: 103 mEq/L (ref 96–112)
Creatinine, Ser: 1.52 mg/dL — ABNORMAL HIGH (ref 0.50–1.35)
GFR calc non Af Amer: 48 mL/min — ABNORMAL LOW (ref >90)
GFR, EST AFRICAN AMERICAN: 55 mL/min — AB (ref >90)
Glucose, Bld: 79 mg/dL (ref 70–99)
POTASSIUM: 4.7 meq/L (ref 3.7–5.3)
Sodium: 139 mEq/L (ref 137–147)

## 2013-09-14 LAB — CBC
HCT: 23.1 % — ABNORMAL LOW (ref 39.0–52.0)
Hemoglobin: 7.6 g/dL — ABNORMAL LOW (ref 13.0–17.0)
MCH: 29.3 pg (ref 26.0–34.0)
MCHC: 32.9 g/dL (ref 30.0–36.0)
MCV: 89.2 fL (ref 78.0–100.0)
Platelets: 252 10*3/uL (ref 150–400)
RBC: 2.59 MIL/uL — ABNORMAL LOW (ref 4.22–5.81)
RDW: 18.9 % — AB (ref 11.5–15.5)
WBC: 4 10*3/uL (ref 4.0–10.5)

## 2013-09-14 SURGERY — ECHOCARDIOGRAM, TRANSESOPHAGEAL
Anesthesia: Moderate Sedation

## 2013-09-14 SURGERY — ATRIAL FLUTTER ABLATION
Anesthesia: LOCAL

## 2013-09-14 MED ORDER — MIDAZOLAM HCL 5 MG/5ML IJ SOLN
INTRAMUSCULAR | Status: AC
  Filled 2013-09-14: qty 5

## 2013-09-14 MED ORDER — SODIUM CHLORIDE 0.9 % IJ SOLN
3.0000 mL | Freq: Two times a day (BID) | INTRAMUSCULAR | Status: DC
  Administered 2013-09-14: 3 mL via INTRAVENOUS

## 2013-09-14 MED ORDER — MIDAZOLAM HCL 10 MG/2ML IJ SOLN
INTRAMUSCULAR | Status: DC | PRN
  Administered 2013-09-14: 2 mg via INTRAVENOUS
  Administered 2013-09-14 (×2): 1 mg via INTRAVENOUS
  Administered 2013-09-14: 2 mg via INTRAVENOUS
  Administered 2013-09-14 (×2): 1 mg via INTRAVENOUS

## 2013-09-14 MED ORDER — HYDROCODONE-HOMATROPINE 5-1.5 MG/5ML PO SYRP
5.0000 mL | ORAL_SOLUTION | Freq: Four times a day (QID) | ORAL | Status: DC | PRN
  Administered 2013-09-14: 5 mL via ORAL
  Filled 2013-09-14: qty 5

## 2013-09-14 MED ORDER — MIDAZOLAM HCL 5 MG/ML IJ SOLN
INTRAMUSCULAR | Status: AC
  Filled 2013-09-14: qty 2

## 2013-09-14 MED ORDER — SODIUM CHLORIDE 0.9 % IV SOLN: 250.0000 mL | INTRAVENOUS | Status: DC | PRN

## 2013-09-14 MED ORDER — FENTANYL CITRATE 0.05 MG/ML IJ SOLN
INTRAMUSCULAR | Status: DC | PRN
  Administered 2013-09-14: 12.5 ug via INTRAVENOUS
  Administered 2013-09-14 (×2): 25 ug via INTRAVENOUS
  Administered 2013-09-14 (×2): 12.5 ug via INTRAVENOUS

## 2013-09-14 MED ORDER — FENTANYL CITRATE 0.05 MG/ML IJ SOLN
INTRAMUSCULAR | Status: AC
  Filled 2013-09-14: qty 2

## 2013-09-14 MED ORDER — SODIUM CHLORIDE 0.9 % IJ SOLN: 3.0000 mL | INTRAMUSCULAR | Status: DC | PRN

## 2013-09-14 MED ORDER — BUPIVACAINE HCL (PF) 0.25 % IJ SOLN
INTRAMUSCULAR | Status: AC
  Filled 2013-09-14: qty 30

## 2013-09-14 MED ORDER — LEVALBUTEROL HCL 0.63 MG/3ML IN NEBU
0.6300 mg | INHALATION_SOLUTION | Freq: Three times a day (TID) | RESPIRATORY_TRACT | Status: DC
  Administered 2013-09-14: 0.63 mg via RESPIRATORY_TRACT
  Filled 2013-09-14: qty 3

## 2013-09-14 MED ORDER — SODIUM CHLORIDE 0.9 % IV SOLN
INTRAVENOUS | Status: DC
  Administered 2013-09-14: 06:00:00 via INTRAVENOUS

## 2013-09-14 MED ORDER — BUTAMBEN-TETRACAINE-BENZOCAINE 2-2-14 % EX AERO
INHALATION_SPRAY | CUTANEOUS | Status: DC | PRN
  Administered 2013-09-14: 2 via TOPICAL

## 2013-09-14 MED ORDER — LEVALBUTEROL HCL 0.63 MG/3ML IN NEBU: 0.6300 mg | INHALATION_SOLUTION | Freq: Three times a day (TID) | RESPIRATORY_TRACT | Status: DC | PRN

## 2013-09-14 NOTE — Interval H&P Note (Signed)
History and Physical Interval Note:  09/14/2013 11:13 AM  Brian Bowen  has presented today for surgery, with the diagnosis of Atrial flutter [427.32]  The various methods of treatment have been discussed with the patient and family. After consideration of risks, benefits and other options for treatment, the patient has consented to  Procedure(s): TRANSESOPHAGEAL ECHOCARDIOGRAM (TEE) (N/A) as a surgical intervention .  The patient's history has been reviewed, patient examined, no change in status, stable for surgery.  I have reviewed the patient's chart and labs.  Questions were answered to the patient's satisfaction.     [name], H   

## 2013-09-14 NOTE — CV Procedure (Signed)
EPS/RFA of atrial flutter without immediate complication. [id number].

## 2013-09-14 NOTE — Progress Notes (Signed)
Mr. Schreur is having admission cough. He's coughing up some thick sputum but it does not appear to be colored. We'll see about some nebulizer. We will also try some cough medicine.  Get 2 units of blood. His hemoglobin is 7.6.  His heart rate is a little bit better controlled. He still is in atrial flutter.  He's had no pain. He is still urinating quite a bit. He is eating well.  Vital signs look good. He is afebrile. Blood pressure 104/54. Lungs are with scattered crackles. He has no wheezes. Cardiac exam tachycardic but regular. Has an occasional irregular beat. Abdomen is soft. There is no fluid wave. Extremities shows a trace edema.  The of myelofibrosis is stable. He continues on Jakafi. He is on danazol and prednisone for anemia.  Again, I don't see a problem with him being on Xarelto.  He will get his ablation in today.Marland Kitchen  Lum Keas  2 Timothy 4:17

## 2013-09-14 NOTE — CV Procedure (Signed)
     Transesophageal Echocardiogram Note  Brian Bowen [id number] [birth year]  Procedure: Transesophageal Echocardiogram Indications: atrial flutter, pre-ablation study to rule out cardiac source of thromboembolism  Procedure Details Consent: Obtained Time Out: Verified patient identification, verified procedure, site/side was marked, verified correct patient position, special equipment/implants available, Radiology Safety Procedures followed,  medications/allergies/relevent history reviewed, required imaging and test results available.  Performed  Medications: Fentanyl: 87.5 mcg Versed: 8 mg  Left Ventrical:  The cavity size was severely dilated. Wall thickness was normal. Systolic function was normal. The estimated ejection fraction was in the range of 50%   Mitral Valve: Mild to moderate regurgitation.  Aortic Valve: normal   Tricuspid Valve: Mild regurgitation.   Pulmonic Valve: Mild regurgitation.   Left Atrium/ Left atrial appendage: Mildly dilated LA.  No thombus in LA, LAA, normal emptying/filling velocities in LAA.  Atrial septum: no PFO  Aorta: Mild non-mobile plague.   Complications: No apparent complications Patient did tolerate procedure well.  Ena Dawley, Lemmie Evens, MD, [facility] 09/14/2013, 11:14 AM

## 2013-09-14 NOTE — Progress Notes (Signed)
  Echocardiogram Echocardiogram Transesophageal has been performed.  Philipp Deputy 09/14/2013, 11:45 AM

## 2013-09-14 NOTE — Interval H&P Note (Signed)
History and Physical Interval Note: Patient seen and examined. I have reviewed the findings with the patient and the risk/benefits/goals/expectations of EP Study and catheter ablation of atrial flutter have been discussed with the patient and he wishes to proceed.  09/14/2013 2:09 PM  Brian Bowen  has presented today for surgery, with the diagnosis of AFlutter  The various methods of treatment have been discussed with the patient and family. After consideration of risks, benefits and other options for treatment, the patient has consented to  Procedure(s): ATRIAL FLUTTER ABLATION (N/A) as a surgical intervention .  The patient's history has been reviewed, patient examined, no change in status, stable for surgery.  I have reviewed the patient's chart and labs.  Questions were answered to the patient's satisfaction.     Mikle Bosworth.D.

## 2013-09-14 NOTE — Interval H&P Note (Signed)
History and Physical Interval Note: Patient seen and examined. Agree with above history, physical exam, assessment and plan. Will proceed with catheter ablation of atrial flutter after TEE if no LAA thrombus. I have discussed the risks/benefits/goals/expectations of catheter ablation and he wishes to proceed. Will plan to continue xarelto for 3 weeks after ablation.  09/14/2013 9:57 AM  Brian Bowen  has presented today for surgery, with the diagnosis of AFlutter  The various methods of treatment have been discussed with the patient and family. After consideration of risks, benefits and other options for treatment, the patient has consented to  Procedure(s): ATRIAL FLUTTER ABLATION (N/A) as a surgical intervention .  The patient's history has been reviewed, patient examined, no change in status, stable for surgery.  I have reviewed the patient's chart and labs.  Questions were answered to the patient's satisfaction.     Mikle Bosworth.D.

## 2013-09-14 NOTE — H&P (View-Only) (Signed)
Mr. Selbe is having admission cough. He's coughing up some thick sputum but it does not appear to be colored. We'll see about some nebulizer. We will also try some cough medicine.  Get 2 units of blood. His hemoglobin is 7.6.  His heart rate is a little bit better controlled. He still is in atrial flutter.  He's had no pain. He is still urinating quite a bit. He is eating well.  Vital signs look good. He is afebrile. Blood pressure 104/54. Lungs are with scattered crackles. He has no wheezes. Cardiac exam tachycardic but regular. Has an occasional irregular beat. Abdomen is soft. There is no fluid wave. Extremities shows a trace edema.  The of myelofibrosis is stable. He continues on Jakafi. He is on danazol and prednisone for anemia.  Again, I don't see a problem with him being on Xarelto.  He will get his ablation in today..  Pete E.  2 Timothy 4:17 

## 2013-09-14 NOTE — Interval H&P Note (Signed)
History and Physical Interval Note:  09/14/2013 11:13 AM  Brian Bowen  has presented today for surgery, with the diagnosis of Atrial flutter [427.32]  The various methods of treatment have been discussed with the patient and family. After consideration of risks, benefits and other options for treatment, the patient has consented to  Procedure(s): TRANSESOPHAGEAL ECHOCARDIOGRAM (TEE) (N/A) as a surgical intervention .  The patient's history has been reviewed, patient examined, no change in status, stable for surgery.  I have reviewed the patient's chart and labs.  Questions were answered to the patient's satisfaction.     Ena Dawley, H

## 2013-09-14 NOTE — Progress Notes (Signed)
UR Completed.  Brown, Sarah Jane [phone] 09/14/2013  

## 2013-09-14 NOTE — Progress Notes (Addendum)
Patient: Brian Bowen Date of Encounter: 09/14/2013, 6:53 AM Admit date: 09/10/2013     Subjective  [name] has no new complaints. He continues to have productive cough. No fever or chills. He denies CP, SOB or palpitations. On IV diltiazem and Xarelto. Received 2 units RBCs yesterday. Dr. Marin Olp added Xopenex and Hycodan this AM.   Objective  Physical Exam: Vitals: BP 104/54  Pulse 77  Temp(Src) 98.5 F (36.9 C) (Oral)  Resp 19  Ht 5\' 11"  (1.803 m)  Wt 175 lb 0.7 oz (79.4 kg)  BMI 24.42 kg/m2  SpO2 99% General: Well developed, well appearing 63 year old male in no acute distress. Neck: Supple. JVD not elevated. Lungs: Clear bilaterally to auscultation without wheezes, rales, or rhonchi. Breathing is unlabored. Heart: Regular S1 S2 without murmurs, rubs, or gallops.  Abdomen: Soft, non-distended. Extremities: No clubbing or cyanosis. No edema.  Distal pedal pulses are 2+ and equal bilaterally. Neuro: Alert and oriented X 3. Moves all extremities spontaneously. No focal deficits.  Intake/Output:  Intake/Output Summary (Last 24 hours) at 09/14/13 0653 Last data filed at 09/14/13 0000  Gross per 24 hour  Intake   2130 ml  Output   3875 ml  Net  -1745 ml    Inpatient Medications:  . Chlorhexidine Gluconate Cloth  6 each Topical Once  . danazol  200 mg Oral TID  . deferoxamine (DESFERAL) IV  2,000 mg Intravenous Q12H  . febuxostat  40 mg Oral QHS  . fluticasone  1 spray Each Nare Daily  . levalbuterol  0.63 mg Inhalation 3 times per day  . pantoprazole  40 mg Oral Daily  . predniSONE  20 mg Oral Q breakfast  . rivaroxaban  20 mg Oral Q supper  . ruxolitinib phosphate  5 mg Oral BID   . sodium chloride    . sodium chloride    . diltiazem (CARDIZEM) infusion 20 mg/hr (09/14/13 0300)    Labs:  Recent Labs  09/14/13 0410  NA 139  K 4.7  CL 103  CO2 22  GLUCOSE 79  BUN 20  CREATININE 1.52*  CALCIUM 8.6    Recent Labs  09/13/13 0354  09/14/13 0410  WBC 5.4 4.0  HGB 7.7* 7.6*  HCT 23.3* 23.1*  MCV 88.3 89.2  PLT 267 252    Radiology/Studies: Ct Angio Chest Pe W/cm &/or Wo Cm  09/11/2013   CLINICAL DATA:  Elevated heart rate. New onset atrial flutter. Pulmonary embolism.  EXAM: CT ANGIOGRAPHY CHEST WITH CONTRAST  TECHNIQUE: Multidetector CT imaging of the chest was performed using the standard protocol during bolus administration of intravenous contrast. Multiplanar CT image reconstructions and MIPs were obtained to evaluate the vascular anatomy.  CONTRAST:  159mL OMNIPAQUE IOHEXOL 350 MG/ML SOLN  COMPARISON:  DG CHEST 1V PORT dated 09/10/2013  FINDINGS: Negative for pulmonary embolism. No aortic abnormality. No mediastinal or hilar adenopathy. No pericardial effusion is present. Incidental imaging the upper abdomen is grossly within normal limits. Small bilateral pleural effusions layer posteriorly. Airspace disease is present at the lung bases bilaterally, along with interlobular septal thickening and thickening of the fissures and pulmonary ligaments. This is compatible with pulmonary edema and CHF. Calcified granuloma is present in the right upper lobe measuring 9 mm (image 76 series 6). There are no aggressive osseous lesions. Thoracic spondylosis. Lower lobe bronchi a demonstrate thickening, probably secondary to volume overload and pulmonary edema. Bronchitis can produce this appearance as well.  Review of the MIP  images confirms the above findings.  IMPRESSION: 1. Negative for pulmonary embolism or acute aortic abnormality. 2. Small bilateral pleural effusions. Septal thickening and ground-glass attenuation at the bases is most compatible with pulmonary edema. The findings suggest mild CHF.   Electronically Signed   By: Dereck Ligas M.D.   On: 09/11/2013 04:29   Dg Chest Portable 1 View  09/10/2013   CLINICAL DATA:  Tachycardia, cough  EXAM: PORTABLE CHEST - 1 VIEW  COMPARISON:  None.  FINDINGS: The heart size and  mediastinal contours are within normal limits. Both lungs are clear. The visualized skeletal structures are unremarkable.  IMPRESSION: No active disease.   Electronically Signed   By: Inez Catalina M.D.   On: 09/10/2013 17:30    Echocardiogram Study Conclusions - Left ventricle: The cavity size was severely dilated. Wall thickness was normal. Systolic function was normal. The estimated ejection fraction was in the range of 50% to 55%. Regional wall motion abnormalities cannot be excluded. - Mitral valve: Mild regurgitation. - Left atrium: The atrium was mildly to moderately dilated. - Right atrium: The atrium was mildly to moderately dilated.  Telemetry: typical appearing atrial flutter, V rate 90-110 bpm currently   Assessment and Plan   1. Atrial flutter  Rates are better  Continue Xarelto  Plan for TEE-guided atrial flutter ablation today  Dr. Lovena Le to see  2. Mild CHF (Acute diastolic)  EF is normal  Likely due to atrial flutter with RVR   3. Anemia  Received 2 units RBCs Hgb/Hct 7.6/23 Appreciate Dr. Antonieta Pert help   4. Myelofibrosis/ myeloid metaplasia  Per Dr. Marin Olp   Signed, EDMISTEN, Hunt Oris  EP Attending  Patient seen and examined. Agree with above. [employer] for discharge home.  Mikle Bosworth.D.

## 2013-09-14 NOTE — H&P (View-Only) (Signed)
ELECTROPHYSIOLOGY CONSULT NOTE    Patient ID: Brian Bowen MRN: [id number], DOB/AGE: 62-12-1950 62 y.o.  Admit date: 09/10/2013 Date of Consult: 09-11-2013  Primary Physician: Volanda Napoleon, MD Primary Cardiologist: new to [facility]  Reason for Consultation: atrial flutter  HPI:  Brian Bowen is a 63 y.o. male with a past medical history significant for myelofibrosis associated with significant anemia. He is followed by Dr Marin Olp at the cancer center.  Otherwise, he has no health problems.  He recently went to [place] on vacation with his wife.  On 08-27-2013 he noticed palpitations and felt that his heart was out of rhythm.  He had no associated shortness of breath or chest pain. He thinks he has been out of rhythm since that date.  He went to see Dr Marin Olp yesterday for routine follow up and was found to be in atrial flutter with RVR. He was sent to [facility] for further evaluation.  He has been placed on a Diltiazem and heparin drip.  EP has been asked to evaluate for treatment options.   Echo is pending this admission.  TSH is pending.  Lab work is notable for Hgb of 7 (stable for patient), WBC 12.1, creat 1.4, proBNP 3623, negative cardiac enzymes.    Today he denies chest pain, shortness of breath, or dizziness.  He recognizes that his heart rate is fast but feels well.    ROS is negative except as outlined above.    Past Medical History  Diagnosis Date  . 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 Tonsillectomy      age 28 years old  . Back surgery      #6 upper back, nerve cut  . Left arm      broken-plate from elbow down     Prescriptions prior to admission  Medication Sig Dispense Refill  . aspirin 81 MG tablet Take 81 mg by mouth daily after breakfast.       . Chlorphen-Phenyleph-ASA (ALKA-SELTZER PLUS COLD) 2-7.8-325 MG TBEF Take 1 capsule by mouth daily as needed (for  nasal drainage).      . danazol (DANOCRINE) 200 MG capsule Take 1 capsule (200 mg total) by mouth 3 (three) times daily.  90 capsule  1  . diphenhydrAMINE (BENADRYL) 25 mg capsule Take 25 mg by mouth every 6 (six) hours as needed (for nasal drainage).      . febuxostat (ULORIC) 40 MG tablet Take 40 mg by mouth at bedtime.       Marland Kitchen omeprazole (PRILOSEC) 20 MG capsule Take 20 mg by mouth every morning.       . predniSONE (DELTASONE) 20 MG tablet Take 1 tablet (20 mg total) by mouth daily with breakfast.  30 tablet  4  . ruxolitinib phosphate (JAKAFI) 5 MG tablet Take 5 mg by mouth 2 (two) times daily.        Inpatient Medications:  . aspirin  81 mg Oral QPC breakfast  . danazol  200 mg Oral TID  . deferoxamine (DESFERAL) IV  2,000 mg Intravenous Q12H  . febuxostat  40 mg Oral QHS  . pantoprazole  40 mg Oral Daily  . predniSONE  20 mg Oral Q breakfast  . ruxolitinib phosphate  5 mg Oral BID    Allergies:  Allergies  Allergen Reactions  . Ace Inhibitors Other (See Comments)    unknown    History   Social  History  . Marital Status: Married    Spouse Name: N/A    Number of Children: N/A  . Years of Education: N/A   Occupational History  . Not on file.   Social History Main Topics  . Smoking status: Former Smoker -- 0.25 packs/day for 13 years    Types: Cigarettes    Start date: 04/21/1971    Quit date: 01/19/1984  . Smokeless tobacco: Never Used  . Alcohol Use: Yes  . Drug Use: No  . Sexual Activity: Not on file   Other Topics Concern  . Not on file   Social History Narrative  . No narrative on file     No family history on file.   Physical Exam: Filed Vitals:   09/11/13 0330 09/11/13 0458 09/11/13 0829 09/11/13 1236  BP: 113/60  121/65 115/66  Pulse: 157  160 164  Temp: 98.3 F (36.8 C)  98.4 F (36.9 C) 98.3 F (36.8 C)  TempSrc: Oral  Oral Oral  Resp: 25  16 24   Height:      Weight:  182 lb 1.6 oz (82.6 kg)    SpO2: 100%  100% 100%    GEN- The  patient is well appearing, alert and oriented x 3 today.   Head- normocephalic, atraumatic Eyes-  Sclera clear, conjunctiva pink Ears- hearing intact Oropharynx- clear Neck- supple, no JVP Lymph- no cervical lymphadenopathy Lungs- Clear to ausculation bilaterally, normal work of breathing Heart- tachy irregular rhythm, no murmurs, rubs or gallops, PMI not laterally displaced GI- soft, NT, ND, + BS Extremities- no clubbing, cyanosis, +1 edema MS- no significant deformity or atrophy Skin- no rash or lesion Psych- euthymic mood, full affect Neuro- strength and sensation are intact   Labs:   Lab Results  Component Value Date   WBC 12.1* 09/10/2013   HGB 7.0* 09/10/2013   HCT 23.4* 09/10/2013   MCV 95.9 09/10/2013   PLT 378 09/10/2013    Recent Labs Lab 09/10/13 1719  NA 140  K 5.1  CL 102  CO2 22  BUN 20  CREATININE 1.40*  CALCIUM 8.7  PROT 7.2  BILITOT 0.5  ALKPHOS 33*  ALT 23  AST 15  GLUCOSE 102*   Lab Results  Component Value Date   TROPONINI <0.30 09/10/2013    Radiology/Studies: Ct Angio Chest Pe W/cm &/or Wo Cm 09/11/2013   CLINICAL DATA:  Elevated heart rate. New onset atrial flutter. Pulmonary embolism.  EXAM: CT ANGIOGRAPHY CHEST WITH CONTRAST  TECHNIQUE: Multidetector CT imaging of the chest was performed using the standard protocol during bolus administration of intravenous contrast. Multiplanar CT image reconstructions and MIPs were obtained to evaluate the vascular anatomy.  CONTRAST:  17mL OMNIPAQUE IOHEXOL 350 MG/ML SOLN  COMPARISON:  DG CHEST 1V PORT dated 09/10/2013  FINDINGS: Negative for pulmonary embolism. No aortic abnormality. No mediastinal or hilar adenopathy. No pericardial effusion is present. Incidental imaging the upper abdomen is grossly within normal limits. Small bilateral pleural effusions layer posteriorly. Airspace disease is present at the lung bases bilaterally, along with interlobular septal thickening and thickening of the fissures and  pulmonary ligaments. This is compatible with pulmonary edema and CHF. Calcified granuloma is present in the right upper lobe measuring 9 mm (image 76 series 6). There are no aggressive osseous lesions. Thoracic spondylosis. Lower lobe bronchi a demonstrate thickening, probably secondary to volume overload and pulmonary edema. Bronchitis can produce this appearance as well.  Review of the MIP images confirms the above findings.  IMPRESSION:  1. Negative for pulmonary embolism or acute aortic abnormality. 2. Small bilateral pleural effusions. Septal thickening and ground-glass attenuation at the bases is most compatible with pulmonary edema. The findings suggest mild CHF.   Electronically Signed   By: Dereck Ligas M.D.   On: 09/11/2013 04:29   Dg Chest Portable 1 View 09/10/2013   CLINICAL DATA:  Tachycardia, cough  EXAM: PORTABLE CHEST - 1 VIEW  COMPARISON:  None.  FINDINGS: The heart size and mediastinal contours are within normal limits. Both lungs are clear. The visualized skeletal structures are unremarkable.  IMPRESSION: No active disease.   Electronically Signed   By: Inez Catalina M.D.   On: 09/10/2013 17:30   EKG: typical appearing atrial flutter with 2:1 conduction  TELEMETRY: atrial flutter with ventricular rates 150's  Assessment and Plan:  1. Typical appearing atrial flutter Minimally symptomatic but with difficult to control V rates despite IV cardizem I have spoken with Dr Marin Olp who knows the patient well.  He thinks that xarelto is reasonable. I will therefore stop heparin drip and start xarelto 20mg  daily (CrCl 64). Will schedule for TEE guided atrial flutter ablation on Monday after 5 half lives of xarelto have been given. Will rate control over the weekend  2. Anemia Stable Per Dr Marin Olp, would not plan to transfuse unless Hb is less than 6  3. Edema Stable Echo pending

## 2013-09-15 ENCOUNTER — Encounter (HOSPITAL_COMMUNITY): Payer: Self-pay | Admitting: Cardiology

## 2013-09-15 LAB — CBC
HCT: 23 % — ABNORMAL LOW (ref 39.0–52.0)
HEMOGLOBIN: 7.4 g/dL — AB (ref 13.0–17.0)
MCH: 29.1 pg (ref 26.0–34.0)
MCHC: 32.2 g/dL (ref 30.0–36.0)
MCV: 90.6 fL (ref 78.0–100.0)
Platelets: 241 10*3/uL (ref 150–400)
RBC: 2.54 MIL/uL — AB (ref 4.22–5.81)
RDW: 19 % — ABNORMAL HIGH (ref 11.5–15.5)
WBC: 3.3 10*3/uL — ABNORMAL LOW (ref 4.0–10.5)

## 2013-09-15 MED ORDER — RIVAROXABAN 20 MG PO TABS: 20.0000 mg | ORAL_TABLET | Freq: Every day | ORAL | Status: DC

## 2013-09-15 NOTE — Progress Notes (Signed)
DC orders received.  Patient stable with no S/S of distress.  Medication and discharge information reviewed with patient and patient's wife.  Patient DC home with wife. Nafeesa Dils Marie  

## 2013-09-15 NOTE — Op Note (Signed)
Brian Bowen, ONDER NO.:  [id number]  MEDICAL RECORD NO.:  [id number]  LOCATION:  [id number]                        FACILITY:  [facility]  PHYSICIAN:  Champ Mungo. Lovena Le, MD    DATE OF BIRTH:  September 22, 1950  DATE OF PROCEDURE:  09/14/2013 DATE OF DISCHARGE:                              OPERATIVE REPORT   PROCEDURE PERFORMED:  Electrophysiologic study and RF catheter ablation of atrial flutter.  INTRODUCTION:  The patient is a 63 year old man who presented to the hospital with symptomatic atrial flutter and rapid ventricular response. He was placed on rate controlling drugs.  He is not a good candidate for long-term anticoagulation.  He is now referred for catheter ablation of his atrial flutter.  DESCRIPTION OF PROCEDURE:  After informed consent was obtained, the patient underwent transesophageal echo, which demonstrated no left atrial appendage thrombus.  He was then taken to the EP lab in a fasting state.  A 6-French hexapolar catheter was inserted percutaneously into the right jugular vein and advanced coronary sinus.  A 6-French quadripolar catheter was inserted percutaneously into the right femoral vein and advanced to the His bundle region.  After measurement of basic intervals, mapping was carried out which demonstrated typical counterclockwise tricuspid annular reentrant atrial flutter.  Mapping of the atrial flutter isthmus was then carried out, which demonstrated a very large atrial flutter isthmus.  In addition, the atrial electrograms were very pronounced.  The ablation catheter was then maneuvered onto the tricuspid valve annulus and a total of 15 RF energy applications, which were delivered for a duration of 55 minutes were applied to the atrial flutter isthmus.  The patient returned to sinus rhythm after the ninth RF energy application.  It took an additional 6 RF energy applications to create atrial flutter isthmus block.  An SR2 sheath was utilized to help  facilitate this.  As said earlier, the atrial flutter isthmus was quite large.  Following catheter ablation and returned to sinus rhythm, rapid ventricular pacing was carried out from the right ventricle at a base drive cycle length of 600 milliseconds and stepwise decreased down to 340 milliseconds where VA Wenckebach was observed. During rapid ventricular pacing, the atrial activation sequence was midline and decremental.  Next, programed ventricular stimulation was carried out from the right ventricle at a base drive cycle length of 500 milliseconds.  The S1-S2 interval stepwise decreased from 440 milliseconds down to 220 milliseconds where ventricular refractoriness was observed.  During programed ventricular stimulation, the atrial activation sequence was midline and decremental.  Next, programed atrial stimulation was carried out from the atrium at a base drive cycle length of 500 milliseconds.  The S1-S2 interval stepwise decreased from 440 milliseconds down to 320 milliseconds where the AV node ERP was observed.  During programed atrial stimulation, there were no AH jumps, no echo beats, and no inducible SVT.  Next, rapid atrial pacing was carried out from the atrium at a pacing cycle length of 600 milliseconds and stepwise decreased down to 400 milliseconds where AV Wenckebach was observed.  During rapid atrial pacing, the PR interval was less than the RR interval and there was no inducible SVT.  At this point, the catheters were  removed.  Hemostasis was assured and the patient was returned to his room in satisfactory condition.  COMPLICATIONS:  There were no immediate procedure complications.  RESULTS:  A.  Baseline ECG.  Baseline ECG demonstrates atrial flutter with variable AV conduction. B.  Baseline intervals.  The atrial flutter cycle length was 192 milliseconds.  The HV interval was 52 milliseconds.  The QRS duration was 70 milliseconds.  Following ablation, the HV  interval was 50 milliseconds.  The sinus node cycle length was 840 milliseconds and the PR interval was 130 milliseconds. C.  Rapid ventricular pacing.  Rapid ventricular pacing was carried out from the right ventricle at a pacing cycle length of 600 milliseconds and stepwise decreased down to 340 milliseconds where VA Wenckebach was observed.  During rapid ventricular pacing, the atrial activation sequence was midline and decremental. D.  Programed ventricular stimulation.  Programed ventricular stimulation was carried out from the right ventricle at base drive cycle length of 500 milliseconds.  The S1-S2 interval stepwise decreased down to 220 milliseconds where ventricular refractoriness was observed. During programed ventricular stimulation, the atrial activation sequence was midline and decremental. E.  Programed atrial stimulation.  Programed atrial stimulation was carried out from the atrium at a base drive cycle length of 500 milliseconds and the S1-S2 interval stepwise decreased from 440 milliseconds down to 320 milliseconds with AV node ERP was observed. During programed atrial stimulation, there were no AH jumps, no echo beats, and no inducible SVT. F.  Rapid atrial pacing.  Rapid atrial pacing was carried out from the atrium and a pacing cycle length of 600 milliseconds and stepwise decreased down to 400 milliseconds where AV Wenckebach was observed. During rapid atrial pacing, the PR interval was less than the RR interval and there was no inducible SVT. G.  Arrhythmias observed. 1. Atrial flutter initiation was present at the time of EP study.  The     duration was sustained.  Cycle length was 194 milliseconds.  The     method of termination was catheter ablation of the atrial flutter     isthmus.     a.     Mapping.  Mapping of the atrial flutter isthmus demonstrated      an unusually large atrial flutter isthmus with very thick atrial      electrograms and atrial  signals which displayed very prominently.     b.     RF energy application.  A total of 15 RF energy applications      for a duration of 55 minutes of RF energy was applied to the      atrial flutter isthmus, resulting in the creation of atrial      flutter isthmus block.  The stimulation to atrial local      electrogram was 150 milliseconds following termination of catheter      ablation.  CONCLUSION:  This study demonstrates successful albeit very difficult electrophysiologic study and RF catheter ablation of typical atrial flutter with a total of 55 minutes of RF energy applied to the atrial flutter isthmus.     Champ Mungo. Lovena Le, MD     GWT/MEDQ  D:  09/14/2013  T:  09/15/2013  Job:  [id number]

## 2013-09-15 NOTE — Care Management Note (Signed)
    Page 1 of 1   09/15/2013     11:53:16 AM   CARE MANAGEMENT NOTE 09/15/2013  Patient:  Brian Bowen, Brian Bowen   Account Number:  [id number]  Date Initiated:  09/14/2013  Documentation initiated by:  [facility]  Subjective/Objective Assessment:   admitted with AF with RVR - on diltiazem     Action/Plan:   Anticipated DC Date:  09/17/2013   Anticipated DC Plan:  [place]  CM consult      Choice offered to / List presented to:             Status of service:  Completed, signed off Medicare Important Message given?   (If response is "NO", the following Medicare IM given date fields will be blank) Date Medicare IM given:   Date Additional Medicare IM given:    Discharge Disposition:  HOME/SELF CARE  Per UR Regulation:  Reviewed for med. necessity/level of care/duration of stay  If discussed at [place] of Stay Meetings, dates discussed:    Comments:  ContactGaynelle Arabian [phone]  09-15-13 xarelto 30 day free and discount card given to pt. co pay will be 130.45 Pt is aware. No further needs from CM. Jacqlyn Krauss, RN,BSN [phone]

## 2013-09-15 NOTE — Progress Notes (Addendum)
ELECTROPHYSIOLOGY ROUNDING NOTE    Patient Name: Brian Bowen Date of Encounter: 09/15/2013    SUBJECTIVE:Patient feels well today.  No chest pain or shortness of breath. S/p RFCA of atrial flutter 09-14-2013  TELEMETRY: Reviewed telemetry pt in sinus rhythm: Filed Vitals:   09/14/13 2100 09/14/13 2200 09/14/13 2320 09/15/13 0523  BP: 126/75 121/61  122/60  Pulse: 88 73  79  Temp:    98.3 F (36.8 C)  TempSrc:    Oral  Resp:      Height:      Weight:      SpO2: 97% 97% 95% 99%    Intake/Output Summary (Last 24 hours) at 09/15/13 0802 Last data filed at 09/15/13 7939  Gross per 24 hour  Intake    740 ml  Output   2340 ml  Net  -1600 ml    CURRENT MEDICATIONS: . danazol  200 mg Oral TID  . deferoxamine (DESFERAL) IV  2,000 mg Intravenous Q12H  . febuxostat  40 mg Oral QHS  . fluticasone  1 spray Each Nare Daily  . pantoprazole  40 mg Oral Daily  . predniSONE  20 mg Oral Q breakfast  . rivaroxaban  20 mg Oral Q supper  . ruxolitinib phosphate  5 mg Oral BID  . sodium chloride  3 mL Intravenous Q12H    LABS: Basic Metabolic Panel:  Recent Labs  09/14/13 0410  NA 139  K 4.7  CL 103  CO2 22  GLUCOSE 79  BUN 20  CREATININE 1.52*  CALCIUM 8.6   CBC:  Recent Labs  09/14/13 0410 09/15/13 0504  WBC 4.0 3.3*  HGB 7.6* 7.4*  HCT 23.1* 23.0*  MCV 89.2 90.6  PLT 252 241    Radiology/Studies:  Ct Angio Chest Pe W/cm &/or Wo Cm 09/11/2013   CLINICAL DATA:  Elevated heart rate. New onset atrial flutter. Pulmonary embolism.  EXAM: CT ANGIOGRAPHY CHEST WITH CONTRAST  TECHNIQUE: Multidetector CT imaging of the chest was performed using the standard protocol during bolus administration of intravenous contrast. Multiplanar CT image reconstructions and MIPs were obtained to evaluate the vascular anatomy.  CONTRAST:  154mL OMNIPAQUE IOHEXOL 350 MG/ML SOLN  COMPARISON:  DG CHEST 1V PORT dated 09/10/2013  FINDINGS: Negative for pulmonary embolism. No aortic  abnormality. No mediastinal or hilar adenopathy. No pericardial effusion is present. Incidental imaging the upper abdomen is grossly within normal limits. Small bilateral pleural effusions layer posteriorly. Airspace disease is present at the lung bases bilaterally, along with interlobular septal thickening and thickening of the fissures and pulmonary ligaments. This is compatible with pulmonary edema and CHF. Calcified granuloma is present in the right upper lobe measuring 9 mm (image 76 series 6). There are no aggressive osseous lesions. Thoracic spondylosis. Lower lobe bronchi a demonstrate thickening, probably secondary to volume overload and pulmonary edema. Bronchitis can produce this appearance as well.  Review of the MIP images confirms the above findings.  IMPRESSION: 1. Negative for pulmonary embolism or acute aortic abnormality. 2. Small bilateral pleural effusions. Septal thickening and ground-glass attenuation at the bases is most compatible with pulmonary edema. The findings suggest mild CHF.   Electronically Signed   By: Dereck Ligas M.D.   On: 09/11/2013 04:29   Dg Chest Portable 1 View 09/10/2013   CLINICAL DATA:  Tachycardia, cough  EXAM: PORTABLE CHEST - 1 VIEW  COMPARISON:  None.  FINDINGS: The heart size and mediastinal contours are within normal limits. Both lungs are clear. The  visualized skeletal structures are unremarkable.  IMPRESSION: No active disease.   Electronically Signed   By: Inez Catalina M.D.   On: 09/10/2013 17:30    PHYSICAL EXAM Well appearing NAD HEENT: Unremarkable,[place], AT Neck:  6 JVD, no thyromegally Back:  No CVA tenderness Lungs:  Clear with no wheezes, rales, or rhonchi HEART:  Regular rate rhythm, no murmurs, no rubs, no clicks Abd:  soft, positive bowel sounds, no organomegally, no rebound, no guarding Ext:  2 plus pulses, no edema, no cyanosis, no clubbing, no hematoma Skin:  No rashes no nodules Neuro:  CN II through XII intact, motor grossly  intact    A/P 1. Atrial flutter, s/p EPS/RFA with restoration of NSR 2. Acute diastolic CHF, secondary to #1. Resolved after diuretic therapy and catheter ablation.  Plan - ok for discharge home. He will need 3 weeks of anti-coagulation then stop xarelto.  I would like to see him back in 3-4 weeks for followup. I can see him in [place] office if he would prefer.  Wound care, restrictions reviewed with patient.  Routine follow up scheduled.   Mikle Bosworth.D.

## 2013-09-15 NOTE — Discharge Summary (Addendum)
ELECTROPHYSIOLOGY DISCHARGE SUMMARY    Patient ID: Brian Bowen,  MRN: [id number], DOB/AGE: 12-16-1950 64 y.o.  Admit date: 09/10/2013 Discharge date: 09/15/2013  Primary Care Physician: Woody Seller, MD Hematologist / Oncologist: Marin Olp, MD Cardiologist: new to [facility], MD  Primary Discharge Diagnoses:  1. Atrial flutter s/p EPS +RF ablation  2. Iron overload s/p IV Desferal for iron chelation  3. Myelofibrosis with chronic anemia s/p transfusion 2 units pRBCs 4. Acute diastolic heart failure secondary to #1.   Secondary Discharge Diagnoses:  None  Procedures This Admission:   1. CT angiogram of chest 09/11/2013 IMPRESSION: 1. Negative for pulmonary embolism or acute aortic abnormality. 2. Small bilateral pleural effusions. Septal thickening and ground-glass attenuation at the bases is most compatible with pulmonary edema. The findings suggest mild CHF.  2. 2D echocardiogram 09/12/2013 Study Conclusions - Left ventricle: The cavity size was severely dilated. Wall thickness was normal. Systolic function was normal. The estimated ejection fraction was in the range of 50% to 55%. Regional wall motion abnormalities cannot be excluded. - Mitral valve: Mild regurgitation. - Left atrium: The atrium was mildly to moderately dilated. - Right atrium: The atrium was mildly to moderately dilated.  3. TEE-guided EP study +RF ablation for atrial flutter 09/14/2013 TEE findings:  Left Ventricle: The cavity size was severely dilated. Wall thickness was normal. Systolic function was normal. The estimated ejection fraction was in the range of 50%  Mitral Valve: Mild to moderate regurgitation.  Aortic Valve: normal  Tricuspid Valve: Mild regurgitation.  Pulmonic Valve: Mild regurgitation.  Left Atrium/ Left atrial appendage: Mildly dilated LA. No thombus in LA, LAA, normal emptying/filling velocities in LAA.  Atrial septum: no PFO  Aorta: Mild non-mobile  plague.  EP study and catheter ablation: RESULTS:  A. Baseline ECG. Baseline ECG demonstrates atrial flutter  with variable AV conduction.  B. Baseline intervals. The atrial flutter cycle length was 192  milliseconds. The HV interval was 52 milliseconds. The QRS duration was 70 milliseconds. Following ablation, the HV interval was 50 milliseconds. The sinus node cycle length was 840 milliseconds and the PR interval was 130 milliseconds.  C. Rapid ventricular pacing. Rapid ventricular pacing was carried out from the right ventricle at a pacing cycle length of 600 milliseconds and stepwise decreased down to 340 milliseconds where VA Wenckebach was observed. During rapid ventricular pacing, the atrial activation sequence was midline and decremental.   D. Programed ventricular stimulation. Programed ventricular stimulation was carried out from the right ventricle at base drive cycle length of 500 milliseconds. The S1-S2 interval stepwise decreased down to 220 milliseconds where ventricular refractoriness was observed. During programed ventricular stimulation, the atrial activation sequence was midline and decremental.  E. Programed atrial stimulation. Programed atrial stimulation was carried out from the atrium at a base drive cycle length of 500 milliseconds and the S1-S2 interval stepwise decreased from 440 milliseconds down to 320 milliseconds with AV node ERP was observed. During programed atrial stimulation, there were no AH jumps, no echo beats and no inducible SVT.  F. Rapid atrial pacing. Rapid atrial pacing was carried out from the atrium and a pacing cycle length of 600 milliseconds and stepwise decreased down to 400 milliseconds where AV Wenckebach was observed. During rapid atrial pacing, the PR interval was less than the RR interval and there was no inducible SVT.  G. Arrhythmias observed.  1. Atrial flutter initiation was present at the time of EP study. The duration was sustained. Cycle length  was 194 milliseconds. The method of termination was catheter ablation of the atrial flutter isthmus.  a. Mapping. Mapping of the atrial flutter isthmus demonstrated an unusually large atrial flutter isthmus with very thick atrial electrograms and atrial signals which displayed very prominently.  b. RF energy application. A total of 15 RF energy applications for a duration of 55 minutes of RF energy was applied to the atrial flutter isthmus, resulting in the creation of atrial flutter isthmus block. The stimulation to atrial local electrogram was 150 milliseconds following termination of catheter ablation.  CONCLUSION: This study demonstrates successful albeit very difficult electrophysiologic study and RF catheter ablation of typical atrial flutter with a total of 55 minutes of RF energy applied to the atrial flutter isthmus.   History and Hospital Course:  Brian Bowen is a 63 year old man with myelofibrosis, associated with significant anemia. He is followed by Dr Marin Olp. He has no other chronic medical conditions. He recently went to [place] on vacation with his wife. On 08/27/2013 he noticed palpitations and felt that his heart was racing. This persisted for several days. He had no associated shortness of breath or chest pain. After returning home, he went to see Dr Marin Olp for routine follow up and was found to be tachycardic and an ECG showed atrial flutter with RVR. He was sent to [facility] for further evaluation. He was admitted to stepdown and started on diltiazem infusion for rate control in addition to IV heparin. EP was asked to see in consult. Echo was done revealing normal LVEF and no significant valvular abnormalities. CT angiogram of chest negative for PE. TSH within normal range. Lab work was notable for Hgb of 7 (although this is stable for patient), WBC 12.1, creat 1.4, proBNP 3623, negative troponin. TEE-guided EP study +RF ablation of atrial flutter was recommended and Brian Bowen agreed to  proceed. Prior to his procedure he was seen in consultation by Dr. Marin Olp who recommended transfusion and iron chelation. He received 2 units pRBCs prior to his procedure. He also received IV Desferal for iron chelation. On 09/14/2013 Brian Bowen underwent TEE-guided EPS +RF ablation of atrial flutter. Details as outlined above. He tolerated this procedure well without any immediate complication. He remains hemodynamically stable and afebrile. His groin site is intact without significant bleeding or hematoma. He has been given discharge instructions including wound care and activity restrictions. He will continue Xarelto for 4 weeks then stop. He will follow-up in clinic with Dr. Lovena Le in 3-4 weeks. He has been seen, examined and deemed stable for discharge today by Dr. Cristopher Bowen.  Discharge Vitals: Blood pressure 122/60, pulse 79, temperature 98.3 F (36.8 C), temperature source Oral, resp. rate 19, height 5\' 11"  (1.803 m), weight 175 lb 0.7 oz (79.4 kg), SpO2 99.00%.   Labs: Lab Results  Component Value Date   WBC 3.3* 09/15/2013   HGB 7.4* 09/15/2013   HCT 23.0* 09/15/2013   MCV 90.6 09/15/2013   PLT 241 09/15/2013     Recent Labs Lab 09/10/13 1719 09/14/13 0410  NA 140 139  K 5.1 4.7  CL 102 103  CO2 22 22  BUN 20 20  CREATININE 1.40* 1.52*  CALCIUM 8.7 8.6  PROT 7.2  --   BILITOT 0.5  --   ALKPHOS 33*  --   ALT 23  --   AST 15  --   GLUCOSE 102* 79   Lab Results  Component Value Date   TROPONINI <0.30 09/10/2013  Disposition:  The patient is being discharged in stable condition.  Follow-up:     Follow-up Information   Follow up with Brian Peru, MD On 10/14/2013. (At 2:00 PM for follow-up after ablation for atrial flutter)    Specialty:  Cardiology   Contact information:   [address] [place] [address] [phone]       Follow up with Brian Napoleon, MD On 10/01/2013. (At 3:00 PM for hospital follow-up after transfusion and iron  chelation)    Specialty:  Oncology   Contact information:   [address], [address] [place] [address] [phone]      Discharge Medications:    Medication List    STOP taking these medications       ALKA-SELTZER PLUS COLD 2-7.8-325 MG Tbef  Generic drug:  Chlorphen-Phenyleph-ASA     aspirin 81 MG tablet      TAKE these medications       danazol 200 MG capsule  Commonly known as:  DANOCRINE  Take 1 capsule (200 mg total) by mouth 3 (three) times daily.     diphenhydrAMINE 25 mg capsule  Commonly known as:  BENADRYL  Take 25 mg by mouth every 6 (six) hours as needed (for nasal drainage).     febuxostat 40 MG tablet  Commonly known as:  ULORIC  Take 40 mg by mouth at bedtime.     omeprazole 20 MG capsule  Commonly known as:  PRILOSEC  Take 20 mg by mouth every morning.     predniSONE 20 MG tablet  Commonly known as:  DELTASONE  Take 1 tablet (20 mg total) by mouth daily with breakfast.     Rivaroxaban 20 MG Tabs tablet  Commonly known as:  XARELTO  Take 1 tablet (20 mg total) by mouth daily with supper. For 4 weeks then stop.     ruxolitinib phosphate 5 MG tablet  Commonly known as:  JAKAFI  Take 5 mg by mouth 2 (two) times daily.       Duration of Discharge Encounter: Greater than 30 minutes including physician time.  Signed, Ileene Hutchinson, PA-C 09/15/2013, 11:32 AM  EP Attending  Patient seen and examined. Agree with above exam, assessment and plan.   Mikle Bosworth.D.

## 2013-09-15 NOTE — Discharge Instructions (Addendum)
Atrial Flutter Atrial flutter is a heart rhythm that can cause the heart to beat very fast (tachycardia). It originates in the upper chambers of the heart (atria). In atrial flutter, the top chambers of the heart (atria) often beat much faster than the bottom chambers of the heart (ventricles). Atrial flutter has a regular "saw toothed" appearance in an EKG readout. An EKG is a test that records the electrical activity of the heart. Atrial flutter can cause the heart to beat up to 150 beats per minute (BPM). Atrial flutter can either be short lived (paroxysmal) or permanent.  CAUSES  Causes of atrial flutter can be many. Some of these include:  Heart related issues:  Heart attack (myocardial infarction).  Heart failure.  Heart valve problems.  Poorly controlled high blood pressure (hypertension).  Afteropen heart surgery.  Lung related issues:  A blood clot in the lungs (pulmonary embolism).  Chronic obstructive pulmonary disease (COPD). Medications used to treat COPD can attribute to atrial flutter.  Other related causes:  Hyperthyroidism.  Caffeine.  Some decongestant cold medications.  Low electrolyte levels such as potassium or magnesium.  Cocaine. SYMPTOMS  An awareness of your heart beating rapidly (palpitations).  Shortness of breath.  Chest pain.  Low blood pressure (hypotension).  Dizziness or fainting. DIAGNOSIS  Different tests can be performed to diagnose atrial flutter.   An EKG.  Holter monitor. This is a 24 hour recording of your heart rhythm. You will also be given a diary. Write down all symptoms that you have and what you were doing at the time you experienced symptoms.  Cardiac event monitor. This small device can be worn for up to 30 days. When you have heart symptoms, you will push a button on the device. This will then record your heart rhythm.  Echocardiogram. This is an imaging test to look at your heart. Your caregiver will look at your  heart valves and the ventricles.  Stress Test. This test can help determine if the atrial flutter is related to exercise or if coronary artery disease is present.  Laboratory studies will look at certain blood levels like:  Complete blood count (CBC).  Potassium.  Magnesium.  Thyroid function. TREATMENT  Treatment of atrial flutter varies. A combination of therapies may be used or sometimes atrial flutter may need only 1 type of treatment.  Lab work: If your blood work, such as your electrolytes (potassium, magnesium) or your thyroid function tests are abnormal, your caregiver will treat them accordingly.  Medication:  There are several different types of medications that can convert your heart to a normal rhythm and prevent atrial flutter from reoccurring.  Nonsurgical procedures: Nonsurgical techniques may be used to control atrial flutter. Some examples include:  Cardioversion. This technique uses either drugs or an electrical shock to restore a normal heart rhythm:  Cardioversion drugs may be given through an intravenous (IV) line to help "reset" the heart rhythm.  In electrical cardioversion, your caregiver shocks your heart with electrical energy. This helps to reset the heartbeat to a normal rhythm.  Ablation. If atrial flutter is a persistent problem, an ablation may be needed. This procedure is done under mild sedation. High frequency radio-wave energy is used to destroy the area of heart tissue responsible for atrial flutter. SEEK IMMEDIATE MEDICAL CARE IF:   Dizziness.  Near fainting or fainting.  Shortness of breath.  Chest pain or pressure.  Sudden nausea or vomiting.  Profuse sweating. If you have the above symptoms, call your  local emergency service immediately! Do not drive yourself to the hospital. MAKE SURE YOU:   Understand these instructions.  Will watch your condition.  Will get help right away if you are not doing well or get worse. Document  Released: 12/02/2008 Document Revised: 10/08/2011 Document Reviewed: 12/02/2008 Exit[employer] Patient Information 2014 [employer]. _____________________________________________________________________________________    Groin Site Care Instructions No driving for 3 days. No lifting over 5 lbs for 1 week. No sexual activity for 1 week. Keep procedure site clean & dry. If you notice increased pain, swelling, bleeding or pus, call/return!  You may shower, but no soaking baths/hot tubs/pools for 1 week.

## 2013-09-16 NOTE — Addendum Note (Signed)
Addended by: Volanda Napoleon on: 09/16/2013 06:16 PM   Modules accepted: Orders

## 2013-09-30 ENCOUNTER — Ambulatory Visit (HOSPITAL_BASED_OUTPATIENT_CLINIC_OR_DEPARTMENT_OTHER): Payer: BC Managed Care – PPO | Admitting: Hematology & Oncology

## 2013-09-30 ENCOUNTER — Ambulatory Visit (HOSPITAL_COMMUNITY)
Admission: RE | Admit: 2013-09-30 | Discharge: 2013-09-30 | Disposition: A | Discharge status: Home / Self Care | Payer: BC Managed Care – PPO | Source: Ambulatory Visit | Attending: Hematology & Oncology | Admitting: Hematology & Oncology

## 2013-09-30 ENCOUNTER — Other Ambulatory Visit: Payer: BC Managed Care – PPO | Admitting: Lab

## 2013-09-30 DIAGNOSIS — D474 Osteomyelofibrosis: Secondary | ICD-10-CM

## 2013-09-30 NOTE — Progress Notes (Signed)
Mr. Limbaugh comes in for an unscheduled visit. He called earlier this morning stating that he had a lot of relief from the right nostril. He is on Xarelto for atrial flutter. He was electrically converted a couple weeks ago. He's not on any antiarrhythmics.  He has myelofibrosis. He is on medication for this with Jakafi. He is on danazol and prednisone to help with his anemia.  He says that his had some blood clots from the right nostril since being on Xarelto. But never any bleeding. The bleeding started last night. To get the bleeding. He went to a local emergency room. They applied a nasal compression device. They checked his blood counts. He tolerated his hemoglobin was 10 which I find hard to believe since his hemoglobin has never been tender since we first saw him. The bleeding, however, has started to slow down.  I told the emergency room doctor that if he needed a reversal agent for the Xarelto, the FDA recently approved a new prothrombin complex concentrate-Kcentra  - that could be used. Their hospital do not have this in he would had been transferred to a regional Hospital if this was necessary.  I told the doctor to have Mr. Granieri come down here.  He came down here. The bleeding had pretty much slowdown. There is some slight trickling. I applied a gauze to the nostril to help for further compression.  He felt well. His vital signs all looked good. His heart rate was only 82. Blood pressure was fine. His not having chest pain. He is not diaphoretic. His lungs sound good. He had no heart murmur. Cardiac exam was regular rate and rhythm.  I think the bleeding will resolve. Xarelto should be out of his system in another 12 hours.  We will refer him to ENT so they can work with this nasal compression device and remove it. I think that he has a vessel that needs to be cauterized that is probably the source of this bleeding.  I told him to stop the Xarelto.Marland Kitchen He only was going to be on it for  another week or so.  His regular appointment with me tomorrow so we'll keep this so I can check his blood counts.  I think is okay for him to go home. I told him that if there is any more bleeding then he is to come back to [facility] for an evaluation.

## 2013-10-01 ENCOUNTER — Other Ambulatory Visit (HOSPITAL_BASED_OUTPATIENT_CLINIC_OR_DEPARTMENT_OTHER): Payer: BC Managed Care – PPO

## 2013-10-01 ENCOUNTER — Other Ambulatory Visit: Payer: Self-pay | Admitting: *Deleted

## 2013-10-01 ENCOUNTER — Other Ambulatory Visit: Payer: BC Managed Care – PPO | Admitting: Lab

## 2013-10-01 ENCOUNTER — Ambulatory Visit: Payer: BC Managed Care – PPO | Admitting: Hematology & Oncology

## 2013-10-01 DIAGNOSIS — D474 Osteomyelofibrosis: Secondary | ICD-10-CM

## 2013-10-01 LAB — IRON AND TIBC CHCC
%SAT: 32 % (ref 20–55)
Iron: 106 ug/dL (ref 42–163)
TIBC: 335 ug/dL (ref 202–409)
UIBC: 229 ug/dL (ref 117–376)

## 2013-10-01 LAB — CBC WITH DIFFERENTIAL/PLATELET
BASO%: 0.2 % (ref 0.0–2.0)
Basophils Absolute: 0 10*3/uL (ref 0.0–0.1)
EOS%: 0 % (ref 0.0–7.0)
Eosinophils Absolute: 0 10*3/uL (ref 0.0–0.5)
HCT: 24.7 % — ABNORMAL LOW (ref 38.4–49.9)
HGB: 7.5 g/dL — ABNORMAL LOW (ref 13.0–17.1)
LYMPH%: 5.9 % — ABNORMAL LOW (ref 14.0–49.0)
MCH: 28.2 pg (ref 27.2–33.4)
MCHC: 30.4 g/dL — AB (ref 32.0–36.0)
MCV: 92.9 fL (ref 79.3–98.0)
MONO#: 0.6 10*3/uL (ref 0.1–0.9)
MONO%: 5.2 % (ref 0.0–14.0)
NEUT#: 10.9 10*3/uL — ABNORMAL HIGH (ref 1.5–6.5)
NEUT%: 88.7 % — ABNORMAL HIGH (ref 39.0–75.0)
Platelets: 339 10*3/uL (ref 140–400)
RBC: 2.66 10*6/uL — AB (ref 4.20–5.82)
RDW: 21.2 % — AB (ref 11.0–14.6)
WBC: 12.3 10*3/uL — ABNORMAL HIGH (ref 4.0–10.3)
lymph#: 0.7 10*3/uL — ABNORMAL LOW (ref 0.9–3.3)

## 2013-10-01 LAB — COMPREHENSIVE METABOLIC PANEL (CC13)
ALBUMIN: 3.4 g/dL — AB (ref 3.5–5.0)
ALK PHOS: 39 U/L — AB (ref 40–150)
ALT: 27 U/L (ref 0–55)
AST: 17 U/L (ref 5–34)
Anion Gap: 9 mEq/L (ref 3–11)
BUN: 18 mg/dL (ref 7.0–26.0)
CO2: 27 mEq/L (ref 22–29)
CREATININE: 1.3 mg/dL (ref 0.7–1.3)
Calcium: 9 mg/dL (ref 8.4–10.4)
Chloride: 106 mEq/L (ref 98–109)
Glucose: 127 mg/dl (ref 70–140)
POTASSIUM: 4.5 meq/L (ref 3.5–5.1)
Sodium: 142 mEq/L (ref 136–145)
Total Bilirubin: 0.58 mg/dL (ref 0.20–1.20)
Total Protein: 6.5 g/dL (ref 6.4–8.3)

## 2013-10-01 LAB — APTT: aPTT: 31.6 seconds (ref 24–37)

## 2013-10-01 LAB — FERRITIN CHCC

## 2013-10-01 LAB — PROTHROMBIN TIME
INR: 1.19 (ref <1.50)
Prothrombin Time: 14.8 seconds (ref 11.6–15.2)

## 2013-10-01 LAB — LACTATE DEHYDROGENASE (CC13): LDH: 301 U/L — ABNORMAL HIGH (ref 125–245)

## 2013-10-09 ENCOUNTER — Telehealth: Payer: Self-pay | Admitting: *Deleted

## 2013-10-09 ENCOUNTER — Other Ambulatory Visit: Payer: Self-pay | Admitting: *Deleted

## 2013-10-09 DIAGNOSIS — R609 Edema, unspecified: Secondary | ICD-10-CM

## 2013-10-09 MED ORDER — METOLAZONE 2.5 MG PO TABS: 2.5000 mg | ORAL_TABLET | Freq: Every day | ORAL | Status: DC

## 2013-10-09 NOTE — Telephone Encounter (Signed)
See telephone call note. 

## 2013-10-14 ENCOUNTER — Encounter: Payer: Self-pay | Admitting: Internal Medicine

## 2013-10-14 ENCOUNTER — Ambulatory Visit (INDEPENDENT_AMBULATORY_CARE_PROVIDER_SITE_OTHER): Payer: BC Managed Care – PPO | Admitting: Internal Medicine

## 2013-10-14 VITALS — BP 176/68 | HR 87 | Ht 71.0 in | Wt 162.0 lb

## 2013-10-14 DIAGNOSIS — I493 Ventricular premature depolarization: Secondary | ICD-10-CM | POA: Insufficient documentation

## 2013-10-14 DIAGNOSIS — R609 Edema, unspecified: Secondary | ICD-10-CM

## 2013-10-14 DIAGNOSIS — I4892 Unspecified atrial flutter: Secondary | ICD-10-CM

## 2013-10-14 DIAGNOSIS — I4949 Other premature depolarization: Secondary | ICD-10-CM

## 2013-10-14 NOTE — Assessment & Plan Note (Signed)
His Pvc's are in a bigeminal fashion. I hope they will resolve with stopping his diuretic. I have encouraged the patient to increase his intake of foods high in potassium. If not improved, he will need to wear a cardiac monitor.

## 2013-10-14 NOTE — Assessment & Plan Note (Signed)
He has done well s/p ablation. He has stopped his anti-coagulation because nose bleeds. I agree.

## 2013-10-14 NOTE — Progress Notes (Signed)
HPI Brian Bowen returns today for followup. He is a pleasant 63 yo man with a h/o atrial flutter who underwent catheter ablation approximately one month ago. In the interim, he has done well with no chest pain or sob. He did not peripheral edema and was prescribed Metolazone. He has taken this medication for 5 days and has had severe dizziness and nearly passed out. His edema has resolved. He had severe cramps in his legs last night. His weight is down 15 lbs. He has had severe cramps. He denies palpitations. No Known Allergies   Current Outpatient Prescriptions  Medication Sig Dispense Refill  . danazol (DANOCRINE) 200 MG capsule Take 1 capsule (200 mg total) by mouth 3 (three) times daily.  90 capsule  1  . diphenhydrAMINE (BENADRYL) 25 mg capsule Take 25 mg by mouth every 6 (six) hours as needed (for nasal drainage).      . febuxostat (ULORIC) 40 MG tablet Take 40 mg by mouth at bedtime.       . metolazone (ZAROXOLYN) 2.5 MG tablet Take 2.5 mg by mouth as needed. HOLD 10/14/13      . omeprazole (PRILOSEC) 20 MG capsule Take 20 mg by mouth every morning.       . predniSONE (DELTASONE) 20 MG tablet Take 1 tablet (20 mg total) by mouth daily with breakfast.  30 tablet  4  . ruxolitinib phosphate (JAKAFI) 5 MG tablet Take 5 mg by mouth 2 (two) times daily.       No current facility-administered medications for this visit.     Past Medical History  Diagnosis Date  . 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 Atrial flutter     s/p CTI ablation by Dr Lovena Le 08/2013    ROS:   All systems reviewed and negative except as noted in the HPI.   Past Surgical History  Procedure Laterality Date  . Tonsillectomy      age 20 years old  . Back surgery      #6 upper back, nerve cut  . Left arm      broken-plate from elbow down  . Tee without cardioversion N/A 09/14/2013    Procedure: TRANSESOPHAGEAL ECHOCARDIOGRAM (TEE);  Surgeon: Dorothy Spark, MD;  Location: [facility];   Service: Cardiovascular;  Laterality: N/A;  . Ablation  09/14/2013    CTI ablation by Dr Lovena Le     No family history on file.   History   Social History  . Marital Status: Married    Spouse Name: N/A    Number of Children: N/A  . Years of Education: N/A   Occupational History  . Not on file.   Social History Main Topics  . Smoking status: Former Smoker -- 0.25 packs/day for 13 years    Types: Cigarettes    Start date: 04/21/1971    Quit date: 01/19/1984  . Smokeless tobacco: Never Used  . Alcohol Use: Yes  . Drug Use: No  . Sexual Activity: Not on file   Other Topics Concern  . Not on file   Social History Narrative  . No narrative on file     BP 176/68  Pulse 87  Ht 5\' 11"  (1.803 m)  Wt 162 lb (73.483 kg)  BMI 22.60 kg/m2  Physical Exam:  Well appearing middle aged man, NAD HEENT: Unremarkable Neck:  No JVD, no thyromegally Back:  No CVA tenderness Lungs:  Clear with no wheezes HEART:  IRegular rate rhythm, no murmurs,  no rubs, no clicks Abd:  soft, positive bowel sounds, no organomegally, no rebound, no guarding Ext:  2 plus pulses, no edema, no cyanosis, no clubbing Skin:  No rashes no nodules Neuro:  CN II through XII intact, motor grossly intact  EKG - NSR with PVC"s in a bigeminal fashion looking to originate from the RV outflow tract.   Assess/Plan:

## 2013-10-14 NOTE — Assessment & Plan Note (Signed)
He has been given a prescription for metolazone. I think that he is now dry and his edema has resolved. I have asked the patient stop his metolazone, and only restart if weight goes over 170.

## 2013-10-14 NOTE — Patient Instructions (Signed)
Your physician recommends that you schedule a follow-up appointment in: 6 weeks in [place] has recommended you make the following change in your medication:  1) Hold Metolazone unless weight is above 176

## 2013-10-22 ENCOUNTER — Other Ambulatory Visit (HOSPITAL_BASED_OUTPATIENT_CLINIC_OR_DEPARTMENT_OTHER): Payer: BC Managed Care – PPO | Admitting: Lab

## 2013-10-22 ENCOUNTER — Ambulatory Visit (HOSPITAL_BASED_OUTPATIENT_CLINIC_OR_DEPARTMENT_OTHER): Payer: BC Managed Care – PPO | Admitting: Hematology & Oncology

## 2013-10-22 VITALS — BP 158/97 | HR 95 | Temp 98.4°F | Wt 177.0 lb

## 2013-10-22 DIAGNOSIS — I4892 Unspecified atrial flutter: Secondary | ICD-10-CM

## 2013-10-22 DIAGNOSIS — D474 Osteomyelofibrosis: Secondary | ICD-10-CM

## 2013-10-22 DIAGNOSIS — D471 Chronic myeloproliferative disease: Secondary | ICD-10-CM

## 2013-10-22 LAB — CBC WITH DIFFERENTIAL (CANCER CENTER ONLY)
BASO#: 0.1 10*3/uL (ref 0.0–0.2)
BASO%: 0.9 % (ref 0.0–2.0)
EOS%: 0 % (ref 0.0–7.0)
Eosinophils Absolute: 0 10*3/uL (ref 0.0–0.5)
HCT: 22.9 % — ABNORMAL LOW (ref 38.7–49.9)
HGB: 7 g/dL — ABNORMAL LOW (ref 13.0–17.1)
LYMPH#: 0.6 10*3/uL — ABNORMAL LOW (ref 0.9–3.3)
LYMPH%: 6.3 % — AB (ref 14.0–48.0)
MCH: 30.2 pg (ref 28.0–33.4)
MCHC: 30.6 g/dL — ABNORMAL LOW (ref 32.0–35.9)
MCV: 99 fL — AB (ref 82–98)
MONO#: 0.4 10*3/uL (ref 0.1–0.9)
MONO%: 4.5 % (ref 0.0–13.0)
NEUT%: 88.3 % — ABNORMAL HIGH (ref 40.0–80.0)
NEUTROS ABS: 8.1 10*3/uL — AB (ref 1.5–6.5)
Platelets: 452 10*3/uL — ABNORMAL HIGH (ref 145–400)
RBC: 2.32 10*6/uL — ABNORMAL LOW (ref 4.20–5.70)
RDW: 23.6 % — AB (ref 11.1–15.7)
WBC: 9.2 10*3/uL (ref 4.0–10.0)

## 2013-10-22 LAB — RETICULOCYTES (CHCC)
ABS RETIC: 77.4 10*3/uL (ref 19.0–186.0)
RBC.: 2.42 MIL/uL — AB (ref 4.22–5.81)
Retic Ct Pct: 3.2 % — ABNORMAL HIGH (ref 0.4–2.3)

## 2013-10-22 LAB — TECHNOLOGIST REVIEW CHCC SATELLITE

## 2013-10-22 LAB — CMP (CANCER CENTER ONLY)
ALBUMIN: 3.2 g/dL — AB (ref 3.3–5.5)
ALK PHOS: 32 U/L (ref 26–84)
ALT(SGPT): 19 U/L (ref 10–47)
AST: 14 U/L (ref 11–38)
BUN, Bld: 18 mg/dL (ref 7–22)
CO2: 31 mEq/L (ref 18–33)
Calcium: 8.8 mg/dL (ref 8.0–10.3)
Chloride: 100 mEq/L (ref 98–108)
Creat: 1.4 mg/dl — ABNORMAL HIGH (ref 0.6–1.2)
GLUCOSE: 148 mg/dL — AB (ref 73–118)
POTASSIUM: 4.8 meq/L — AB (ref 3.3–4.7)
Sodium: 138 mEq/L (ref 128–145)
TOTAL PROTEIN: 6.4 g/dL (ref 6.4–8.1)
Total Bilirubin: 0.9 mg/dl (ref 0.20–1.60)

## 2013-10-22 LAB — LACTATE DEHYDROGENASE: LDH: 232 U/L (ref 94–250)

## 2013-10-22 LAB — CHCC SATELLITE - SMEAR

## 2013-10-23 LAB — FERRITIN CHCC: Ferritin: 2970 ng/ml — ABNORMAL HIGH (ref 22–316)

## 2013-10-23 LAB — IRON AND TIBC CHCC
%SAT: 89 % — ABNORMAL HIGH (ref 20–55)
IRON: 298 ug/dL — AB (ref 42–163)
TIBC: 334 ug/dL (ref 202–409)
UIBC: 35 ug/dL — ABNORMAL LOW (ref 117–376)

## 2013-10-27 ENCOUNTER — Encounter: Payer: BC Managed Care – PPO | Admitting: Internal Medicine

## 2013-10-27 NOTE — Progress Notes (Signed)
Hematology and Oncology Follow Up Visit  Brian Bowen [id number] 1951-03-29 63 y.o. 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  #2 prednisone 20 mg by mouth daily. #3 Brian Bowen 10 mg by mouth daily      Interim History:  [name] is in for a followup. He's doing okay. He now is off anticoagulation. He had problems with anticoagulation with a significant nose bleed.. We saw him for this in the office. He was referred over to ENT. Clinically, the bleeding has stopped.  He feels well. He sees cardiology in a week or so.  He's working as always. He's had no problems with fatigue. He tolerated the danazol and prednisone well. He has started to have some effects from the prednisone. His face appears more cushingoid.  He has had no problems with Brian Bowen. He says this really helps with systemic symptoms of sweats and arthralgias.  He's had no change in bowel or bladder habits. He's had no cough. He's had no rashes.  Medications: Current outpatient prescriptions:danazol (DANOCRINE) 200 MG capsule, Take 1 capsule (200 mg total) by mouth 3 (three) times daily., Disp: 90 capsule, Rfl: 1;  diphenhydrAMINE (BENADRYL) 25 mg capsule, Take 25 mg by mouth every 6 (six) hours as needed (for nasal drainage)., Disp: , Rfl: ;  febuxostat (ULORIC) 40 MG tablet, Take 40 mg by mouth at bedtime. , Disp: , Rfl:  metolazone (ZAROXOLYN) 2.5 MG tablet, Take 2.5 mg by mouth as needed. HOLD 10/14/13, Disp: , Rfl: ;  omeprazole (PRILOSEC) 20 MG capsule, Take 20 mg by mouth every morning. , Disp: , Rfl: ;  predniSONE (DELTASONE) 20 MG tablet, Take 1 tablet (20 mg total) by mouth daily with breakfast., Disp: 30 tablet, Rfl: 4;  ruxolitinib phosphate (Brian Bowen) 5 MG tablet, Take 5 mg by mouth 2 (two) times daily., Disp: , Rfl:   Allergies: No Known Allergies  Past Medical History,  Surgical history, Social history, and Family History were reviewed and updated.  Review of Systems: As above  Physical Exam:  weight is 177 lb (80.287 kg). His oral temperature is 98.4 F (36.9 C). His blood pressure is 158/97 and his pulse is 95.   Well-developed well-nourished gentleman. Again, has a cushingoid-like face. No oral lesions noted. No thrush. No adenopathy in the neck. Lungs are clear. Cardiac exam regular rate and rhythm. He does have an occasional extra beat. Abdomen is soft. There is no fluid wave. He has no palpable liver edge. His spleen tip is palpable just below the left [place] to. Extremities shows some edema in the lower legs. This is 1+. His good strength in his arms legs. He has good range of motion of his joints. No joint swelling is noted. Neurological exam is nonfocal. Skin exam shows no ecchymoses or petechia or rashes.  Lab Results  Component Value Date   WBC 9.2 10/22/2013   HGB 7.0* 10/22/2013   HCT 22.9* 10/22/2013   MCV 99* 10/22/2013   PLT 452* 10/22/2013     Chemistry      Component Value Date/Time   NA 138 10/22/2013 1438   NA 142 10/01/2013 1256   NA 139 09/14/2013 0410   K 4.8* 10/22/2013 1438   K 4.5 10/01/2013 1256   K 4.7 09/14/2013 0410   CL 100 10/22/2013 1438   CL 103 09/14/2013 0410   CO2 31  10/22/2013 1438   CO2 27 10/01/2013 1256   CO2 22 09/14/2013 0410   BUN 18 10/22/2013 1438   BUN 18.0 10/01/2013 1256   BUN 20 09/14/2013 0410   CREATININE 1.4* 10/22/2013 1438   CREATININE 1.3 10/01/2013 1256   CREATININE 1.52* 09/14/2013 0410      Component Value Date/Time   CALCIUM 8.8 10/22/2013 1438   CALCIUM 9.0 10/01/2013 1256   CALCIUM 8.6 09/14/2013 0410   ALKPHOS 32 10/22/2013 1438   ALKPHOS 39* 10/01/2013 1256   ALKPHOS 33* 09/10/2013 1719   AST 14 10/22/2013 1438   AST 17 10/01/2013 1256   AST 15 09/10/2013 1719   ALT 19 10/22/2013 1438   ALT 27 10/01/2013 1256   ALT 23 09/10/2013 1719   BILITOT 0.90 10/22/2013 1438   BILITOT 0.58 10/01/2013 1256   BILITOT 0.5  09/10/2013 1719         Impression and Plan: Brian Bowen is 63 year old white male He has primary myelofibrosis. He is on Brian Bowen for this. Again this is controlled his symptoms well.  The real issue with the Brian Bowen is that it is causing marked anemia. We do have him on the danazol and prednisone which is helping. He has not had to be transfused  for over a month now. He is a symptomatic.  Have to watch his iron studies. His ferritin today was 2900. Iron saturation was 89%.  His LDH was 232. His normal renal function. His creatinine was 1.4.  We will go ahead and plan to get him back in a month. He always let's know when we need to transfuse him.  As always, I looked at his blood smear. I do not see any unusual changes other than being normal one set he has with nucleated red cells. He has some large platelets. There are for a few immature myeloid cells. There may be a blasts.  I spent area good half hour with him today. I am glad that he is doing okay from a cardiac perspective appeared to sounds like he maybe back in atrial flutter. Again he sees the cardiologist. I do not know if he will need to  to be cardioverted. He is asymptomatic right now. The  Volanda Napoleon, MD 3/31/20156:45 AM

## 2013-10-29 ENCOUNTER — Ambulatory Visit (HOSPITAL_COMMUNITY)
Admission: RE | Admit: 2013-10-29 | Discharge: 2013-10-29 | Disposition: A | Discharge status: Home / Self Care | Payer: BC Managed Care – PPO | Source: Ambulatory Visit | Attending: Hematology & Oncology | Admitting: Hematology & Oncology

## 2013-10-30 ENCOUNTER — Other Ambulatory Visit: Payer: Self-pay | Admitting: Hematology & Oncology

## 2013-11-12 ENCOUNTER — Ambulatory Visit: Payer: BC Managed Care – PPO | Admitting: Hematology & Oncology

## 2013-11-12 ENCOUNTER — Other Ambulatory Visit: Payer: BC Managed Care – PPO | Admitting: Lab

## 2013-11-19 ENCOUNTER — Ambulatory Visit (HOSPITAL_BASED_OUTPATIENT_CLINIC_OR_DEPARTMENT_OTHER): Payer: BC Managed Care – PPO | Admitting: Hematology & Oncology

## 2013-11-19 ENCOUNTER — Other Ambulatory Visit (HOSPITAL_BASED_OUTPATIENT_CLINIC_OR_DEPARTMENT_OTHER): Payer: BC Managed Care – PPO | Admitting: Lab

## 2013-11-19 ENCOUNTER — Encounter: Payer: Self-pay | Admitting: Hematology & Oncology

## 2013-11-19 VITALS — BP 133/63 | HR 88 | Temp 97.6°F | Resp 18 | Ht 68.0 in | Wt 176.0 lb

## 2013-11-19 DIAGNOSIS — I4892 Unspecified atrial flutter: Secondary | ICD-10-CM

## 2013-11-19 DIAGNOSIS — D474 Osteomyelofibrosis: Secondary | ICD-10-CM

## 2013-11-19 LAB — CHCC SATELLITE - SMEAR

## 2013-11-19 LAB — CBC WITH DIFFERENTIAL (CANCER CENTER ONLY)
BASO#: 0.1 10*3/uL (ref 0.0–0.2)
BASO%: 0.6 % (ref 0.0–2.0)
EOS%: 0 % (ref 0.0–7.0)
Eosinophils Absolute: 0 10*3/uL (ref 0.0–0.5)
HEMATOCRIT: 24.3 % — AB (ref 38.7–49.9)
HGB: 7.4 g/dL — ABNORMAL LOW (ref 13.0–17.1)
LYMPH#: 0.4 10*3/uL — ABNORMAL LOW (ref 0.9–3.3)
LYMPH%: 4.5 % — AB (ref 14.0–48.0)
MCH: 30.3 pg (ref 28.0–33.4)
MCHC: 30.5 g/dL — AB (ref 32.0–35.9)
MCV: 100 fL — ABNORMAL HIGH (ref 82–98)
MONO#: 0.5 10*3/uL (ref 0.1–0.9)
MONO%: 5.5 % (ref 0.0–13.0)
NEUT#: 8.6 10*3/uL — ABNORMAL HIGH (ref 1.5–6.5)
NEUT%: 89.4 % — AB (ref 40.0–80.0)
Platelets: 373 10*3/uL (ref 145–400)
RBC: 2.44 10*6/uL — ABNORMAL LOW (ref 4.20–5.70)
RDW: 25.4 % — AB (ref 11.1–15.7)
WBC: 9.6 10*3/uL (ref 4.0–10.0)

## 2013-11-19 LAB — CMP (CANCER CENTER ONLY)
ALK PHOS: 38 U/L (ref 26–84)
ALT(SGPT): 17 U/L (ref 10–47)
AST: 16 U/L (ref 11–38)
Albumin: 3.2 g/dL — ABNORMAL LOW (ref 3.3–5.5)
BUN: 19 mg/dL (ref 7–22)
CALCIUM: 9.2 mg/dL (ref 8.0–10.3)
CO2: 29 mEq/L (ref 18–33)
Chloride: 103 mEq/L (ref 98–108)
Creat: 1.6 mg/dl — ABNORMAL HIGH (ref 0.6–1.2)
GLUCOSE: 93 mg/dL (ref 73–118)
Potassium: 4.2 mEq/L (ref 3.3–4.7)
Sodium: 141 mEq/L (ref 128–145)
Total Bilirubin: 0.8 mg/dl (ref 0.20–1.60)
Total Protein: 7.1 g/dL (ref 6.4–8.1)

## 2013-11-19 LAB — LACTATE DEHYDROGENASE: LDH: 240 U/L (ref 94–250)

## 2013-11-19 LAB — RETICULOCYTES (CHCC)
ABS Retic: 45 10*3/uL (ref 19.0–186.0)
RBC.: 2.5 MIL/uL — ABNORMAL LOW (ref 4.22–5.81)
Retic Ct Pct: 1.8 % (ref 0.4–2.3)

## 2013-11-19 LAB — TECHNOLOGIST REVIEW CHCC SATELLITE

## 2013-11-19 LAB — HOLD TUBE, BLOOD BANK - CHCC SATELLITE

## 2013-11-19 LAB — IRON AND TIBC CHCC
%SAT: 10 % — AB (ref 20–55)
IRON: 34 ug/dL — AB (ref 42–163)
TIBC: 345 ug/dL (ref 202–409)
UIBC: 311 ug/dL (ref 117–376)

## 2013-11-19 LAB — FERRITIN CHCC: Ferritin: 2878 ng/ml — ABNORMAL HIGH (ref 22–316)

## 2013-11-19 NOTE — Progress Notes (Signed)
Hematology and Oncology Follow Up Visit  Brian Bowen [id number] [birth year] 63 y.o. 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  #2 prednisone 20 mg by mouth daily  #3 Jakafi 10mg  po qd     Interim History:  [name] is is back for followup. He really looks good. We've not had to transfuse him now for over a month. I think that the danazol and prednisone were working.  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 Whether has just not been that good.  He's had no problems heart. He does have the atrial flutter. He goes back to the cardiologist.  His allergies are bothering him a little bit. He does take a over-the-counter agent for this. I told him that I thought it would be okay if he did that.  He's had no problems with the JAKAFI. He's had no nausea vomiting. There is no increase in fatigue. He's had no problems bowels or bladder. His legs are not swollen.  Medications: Current outpatient prescriptions:danazol (DANOCRINE) 200 MG capsule, TAKE ONE CAPSULE BY MOUTH 3 TIMES A DAY, Disp: 90 capsule, Rfl: 1;  febuxostat (ULORIC) 40 MG tablet, Take 40 mg by mouth at bedtime. , Disp: , Rfl: ;  metolazone (ZAROXOLYN) 2.5 MG tablet, Take 2.5 mg by mouth as needed. , Disp: , Rfl: ;  omeprazole (PRILOSEC) 20 MG capsule, Take 20 mg by mouth every morning. , Disp: , Rfl:  predniSONE (DELTASONE) 20 MG tablet, Take 1 tablet (20 mg total) by mouth daily with breakfast., Disp: 30 tablet, Rfl: 4;  ruxolitinib phosphate (JAKAFI) 5 MG tablet, Take 5 mg by mouth 2 (two) times daily., Disp: , Rfl:   Allergies: No Known Allergies  Past Medical History, Surgical history, Social history, and Family History were reviewed and updated.  Review  of Systems: As above  Physical Exam:  height is 5\' 8"  (1.727 m) and weight is 176 lb (79.833 kg). His oral temperature is 97.6 F (36.4 C). His blood pressure is 133/63 and his pulse is 88. His respiration is 18.   Lungs are clear. Cardiac exam regular rate and rhythm. She has no murmurs rubs or bruits. Abdomen is soft. He has no fluid wave. Is no distention. His spleen tip is at the left costal margin. There is no palpable hepatomegaly. Back exam no tenderness over the spine. Lymph nodes are not palpable in the axilla. Extremities shows minimal edema in the lower legs. Has good range of motion of his joints. Skin exam no rashes. Neurological exam no focal deficits.  Lab Results  Component Value Date   WBC 9.6 11/19/2013   HGB 7.4* 11/19/2013   HCT 24.3* 11/19/2013   MCV 100* 11/19/2013   PLT 373 11/19/2013     Chemistry      Component Value Date/Time   NA 141 11/19/2013 1209   NA 142 10/01/2013 1256   NA 139 09/14/2013 0410   K 4.2 11/19/2013 1209   K 4.5 10/01/2013 1256   K 4.7 09/14/2013 0410   CL 103 11/19/2013 1209   CL 103 09/14/2013 0410   CO2 29 11/19/2013 1209   CO2 27 10/01/2013 1256   CO2 22 09/14/2013 0410   BUN 19 11/19/2013 1209  BUN 18.0 10/01/2013 1256   BUN 20 09/14/2013 0410   CREATININE 1.6* 11/19/2013 1209   CREATININE 1.3 10/01/2013 1256   CREATININE 1.52* 09/14/2013 0410      Component Value Date/Time   CALCIUM 9.2 11/19/2013 1209   CALCIUM 9.0 10/01/2013 1256   CALCIUM 8.6 09/14/2013 0410   ALKPHOS 38 11/19/2013 1209   ALKPHOS 39* 10/01/2013 1256   ALKPHOS 33* 09/10/2013 1719   AST 16 11/19/2013 1209   AST 17 10/01/2013 1256   AST 15 09/10/2013 1719   ALT 17 11/19/2013 1209   ALT 27 10/01/2013 1256   ALT 23 09/10/2013 1719   BILITOT 0.80 11/19/2013 1209   BILITOT 0.58 10/01/2013 1256   BILITOT 0.5 09/10/2013 1719         Impression and Plan: Brian Bowen is 63 year old hite gentleman with myelofibrosis. She's done incredibly well. His blood counts are improving which I am happy to  see. As such, we will not change any part of our protocol.  We will get him back now 6 weeks. I don't think we need any blood work in between visits. He always does when his blood count goes down and he needs to be transfused.  On is so heavy that his quality of life is improving. I just wished that the weather would improve so he could get the harvesting done.     Brian Napoleon, MD 4/23/20152:06 PM

## 2013-12-18 ENCOUNTER — Encounter: Payer: Self-pay | Admitting: Internal Medicine

## 2013-12-18 ENCOUNTER — Ambulatory Visit (INDEPENDENT_AMBULATORY_CARE_PROVIDER_SITE_OTHER): Payer: BC Managed Care – PPO | Admitting: Internal Medicine

## 2013-12-18 VITALS — BP 149/80 | HR 92 | Ht 71.0 in | Wt 180.0 lb

## 2013-12-18 DIAGNOSIS — I4892 Unspecified atrial flutter: Secondary | ICD-10-CM

## 2013-12-18 DIAGNOSIS — R609 Edema, unspecified: Secondary | ICD-10-CM

## 2013-12-18 NOTE — Progress Notes (Signed)
HPI Brian Bowen returns today for followup. He is a pleasant 63 yo man with a h/o atrial flutter who underwent catheter ablation approximately 4 months ago. In the interim, he has done well with no chest pain or sob. He has a h/o dehydration with metolazone. He notes that he feels bad for a day or two a month. He does not know why. He does not feel palpitations. He has had some peripheral edema. He denies dietary indiscretion but has a propensity to peripheral edema on steroids. No Known Allergies   Current Outpatient Prescriptions  Medication Sig Dispense Refill  . danazol (DANOCRINE) 200 MG capsule TAKE ONE CAPSULE BY MOUTH 3 TIMES A DAY  90 capsule  1  . febuxostat (ULORIC) 40 MG tablet Take 40 mg by mouth at bedtime.       . metolazone (ZAROXOLYN) 2.5 MG tablet Take 2.5 mg by mouth as needed.       Marland Kitchen omeprazole (PRILOSEC) 20 MG capsule Take 20 mg by mouth every morning.       . predniSONE (DELTASONE) 20 MG tablet Take 1 tablet (20 mg total) by mouth daily with breakfast.  30 tablet  4  . ruxolitinib phosphate (JAKAFI) 5 MG tablet Take 5 mg by mouth 2 (two) times daily.       No current facility-administered medications for this visit.     Past Medical History  Diagnosis Date  . 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 Atrial flutter     s/p CTI ablation by Dr Lovena Le 08/2013    ROS:   All systems reviewed and negative except as noted in the HPI.   Past Surgical History  Procedure Laterality Date  . Tonsillectomy      age 53 years old  . Back surgery      #6 upper back, nerve cut  . Left arm      broken-plate from elbow down  . Tee without cardioversion N/A 09/14/2013    Procedure: TRANSESOPHAGEAL ECHOCARDIOGRAM (TEE);  Surgeon: Dorothy Spark, MD;  Location: [facility];  Service: Cardiovascular;  Laterality: N/A;  . Ablation  09/14/2013    CTI ablation by Dr Lovena Le     No family history on file.   History   Social History  . Marital Status:  Married    Spouse Name: N/A    Number of Children: N/A  . Years of Education: N/A   Occupational History  . Not on file.   Social History Main Topics  . Smoking status: Former Smoker -- 0.25 packs/day for 13 years    Types: Cigarettes    Start date: 04/21/1971    Quit date: 01/19/1984  . Smokeless tobacco: Never Used     Comment: quit 30 years ago  . Alcohol Use: Yes  . Drug Use: No  . Sexual Activity: Not on file   Other Topics Concern  . Not on file   Social History Narrative  . No narrative on file     BP 149/80  Pulse 92  Ht 5\' 11"  (1.803 m)  Wt 180 lb (81.647 kg)  BMI 25.12 kg/m2  Physical Exam:  Well appearing middle aged man, NAD HEENT: Unremarkable Neck:  No JVD, no thyromegally Back:  No CVA tenderness Lungs:  Clear with no wheezes HEART:  IRegular rate rhythm, no murmurs, no rubs, no clicks Abd:  soft, positive bowel sounds, no organomegally, no rebound, no guarding Ext:  2 plus pulses, no edema, no  cyanosis, no clubbing Skin:  No rashes no nodules Neuro:  CN II through XII intact, motor grossly intact  EKG - NSR with PVC"s and PAC's.   Assess/Plan:

## 2013-12-18 NOTE — Assessment & Plan Note (Signed)
He is s/p ablation and maintaining NSR. He could be having atrial fib and I have asked him to check his HR and blood pressure daily.

## 2013-12-18 NOTE — Patient Instructions (Signed)
Your physician recommends that you schedule a follow-up appointment in: 3 months with Dr Knox Saliva will receive a reminder letter two months in advance reminding you to call and schedule your appointment. If you don't receive this letter, please contact our office.  Your physician has requested that you regularly monitor and record your blood pressure readings at home. Please use the same machine at the same time of day to check your readings and record them to bring to your follow-up visit.  Gave pt BP log at visit.

## 2013-12-18 NOTE — Assessment & Plan Note (Signed)
He will continue as needed metolazone. We discussed maintaining adequate potassium.

## 2014-01-01 ENCOUNTER — Encounter: Payer: Self-pay | Admitting: Hematology & Oncology

## 2014-01-01 ENCOUNTER — Other Ambulatory Visit (HOSPITAL_BASED_OUTPATIENT_CLINIC_OR_DEPARTMENT_OTHER): Payer: BC Managed Care – PPO | Admitting: Lab

## 2014-01-01 ENCOUNTER — Ambulatory Visit (HOSPITAL_BASED_OUTPATIENT_CLINIC_OR_DEPARTMENT_OTHER): Payer: BC Managed Care – PPO | Admitting: Hematology & Oncology

## 2014-01-01 VITALS — BP 140/58 | HR 88 | Temp 98.1°F | Resp 18 | Ht 71.0 in | Wt 177.0 lb

## 2014-01-01 DIAGNOSIS — D474 Osteomyelofibrosis: Secondary | ICD-10-CM

## 2014-01-01 DIAGNOSIS — D630 Anemia in neoplastic disease: Secondary | ICD-10-CM

## 2014-01-01 DIAGNOSIS — R609 Edema, unspecified: Secondary | ICD-10-CM

## 2014-01-01 DIAGNOSIS — D7581 Myelofibrosis: Secondary | ICD-10-CM

## 2014-01-01 DIAGNOSIS — I4892 Unspecified atrial flutter: Secondary | ICD-10-CM

## 2014-01-01 LAB — CBC WITH DIFFERENTIAL (CANCER CENTER ONLY)
BASO#: 0.1 10*3/uL (ref 0.0–0.2)
BASO%: 0.6 % (ref 0.0–2.0)
EOS%: 0 % (ref 0.0–7.0)
Eosinophils Absolute: 0 10*3/uL (ref 0.0–0.5)
HCT: 22.5 % — ABNORMAL LOW (ref 38.7–49.9)
HEMOGLOBIN: 6.8 g/dL — AB (ref 13.0–17.1)
LYMPH#: 0.7 10*3/uL — AB (ref 0.9–3.3)
LYMPH%: 6.9 % — ABNORMAL LOW (ref 14.0–48.0)
MCH: 29.4 pg (ref 28.0–33.4)
MCHC: 30.2 g/dL — ABNORMAL LOW (ref 32.0–35.9)
MCV: 97 fL (ref 82–98)
MONO#: 0.6 10*3/uL (ref 0.1–0.9)
MONO%: 6.3 % (ref 0.0–13.0)
NEUT%: 86.2 % — ABNORMAL HIGH (ref 40.0–80.0)
NEUTROS ABS: 8.8 10*3/uL — AB (ref 1.5–6.5)
PLATELETS: 484 10*3/uL — AB (ref 145–400)
RBC: 2.31 10*6/uL — AB (ref 4.20–5.70)
RDW: 25.7 % — ABNORMAL HIGH (ref 11.1–15.7)
WBC: 10.2 10*3/uL — ABNORMAL HIGH (ref 4.0–10.0)

## 2014-01-01 LAB — CMP (CANCER CENTER ONLY)
ALBUMIN: 3.2 g/dL — AB (ref 3.3–5.5)
ALT(SGPT): 23 U/L (ref 10–47)
AST: 18 U/L (ref 11–38)
Alkaline Phosphatase: 27 U/L (ref 26–84)
BILIRUBIN TOTAL: 1 mg/dL (ref 0.20–1.60)
BUN: 24 mg/dL — AB (ref 7–22)
CO2: 30 meq/L (ref 18–33)
Calcium: 9 mg/dL (ref 8.0–10.3)
Chloride: 98 mEq/L (ref 98–108)
Creat: 1.5 mg/dl — ABNORMAL HIGH (ref 0.6–1.2)
Glucose, Bld: 94 mg/dL (ref 73–118)
Potassium: 4.3 mEq/L (ref 3.3–4.7)
SODIUM: 139 meq/L (ref 128–145)
Total Protein: 6.9 g/dL (ref 6.4–8.1)

## 2014-01-01 LAB — RETICULOCYTES (CHCC)
ABS Retic: 62.4 10*3/uL (ref 19.0–186.0)
RBC.: 2.4 MIL/uL — ABNORMAL LOW (ref 4.22–5.81)
RETIC CT PCT: 2.6 % — AB (ref 0.4–2.3)

## 2014-01-01 LAB — TECHNOLOGIST REVIEW CHCC SATELLITE

## 2014-01-01 LAB — CHCC SATELLITE - SMEAR

## 2014-01-01 LAB — HOLD TUBE, BLOOD BANK - CHCC SATELLITE

## 2014-01-01 LAB — LACTATE DEHYDROGENASE: LDH: 245 U/L (ref 94–250)

## 2014-01-01 MED ORDER — METOLAZONE 2.5 MG PO TABS: 2.5000 mg | ORAL_TABLET | ORAL | Status: DC | PRN

## 2014-01-02 NOTE — Progress Notes (Signed)
Hematology and Oncology Follow Up Visit  Brian Bowen [id number] [birth year] 63 y.o. 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  #2 prednisone 20 mg by mouth daily  #3 Jakafi 10mg  po qd     Interim History:  [name] is back for followup. He's been doing fairly well. We have not had to transfuse him probably close to 2 months or more. The danazol and prednisone have been helping a little bit.  The danazol becoming more expensive for him. As such, he is not taking it every day.  Is still working. He's had no problems with his atrial flutter. I think he does not see the cardiologist in a month or so.  Is no sweats. He's had no bowel pain. His leg swelling has gotten better. He is on some diuretic for this.  He's had no bleeding. There's been no change in bowel or bladder habits. He's had no cough or shortness of breath.  Overall his performance status is ECOG 1  Medications: Current outpatient prescriptions:danazol (DANOCRINE) 200 MG capsule, TAKE ONE CAPSULE BY MOUTH 3 TIMES A DAY, Disp: 90 capsule, Rfl: 1;  febuxostat (ULORIC) 40 MG tablet, Take 40 mg by mouth at bedtime. , Disp: , Rfl: ;  metolazone (ZAROXOLYN) 2.5 MG tablet, Take 1 tablet (2.5 mg total) by mouth as needed., Disp: 30 tablet, Rfl: 4;  omeprazole (PRILOSEC) 20 MG capsule, Take 20 mg by mouth every morning. , Disp: , Rfl:  predniSONE (DELTASONE) 20 MG tablet, Take 1 tablet (20 mg total) by mouth daily with breakfast., Disp: 30 tablet, Rfl: 4;  ruxolitinib phosphate (JAKAFI) 5 MG tablet, Take 5 mg by mouth 2 (two) times daily., Disp: , Rfl:   Allergies: No Known Allergies  Past Medical History, Surgical history, Social history, and Family History were reviewed and updated.  Review of Systems: As above  Physical Exam:  height is 5\' 11"  (1.803 m)  and weight is 177 lb (80.287 kg). His oral temperature is 98.1 F (36.7 C). His blood pressure is 140/58 and his pulse is 88. His respiration is 18.   Slightly cushingoid-appearing white gentleman. His head and neck exam shows no scleral icterus. There is no oral lesion. There is no adenopathy in the neck. Lungs are clear. Cardiac exam irregular rate and rhythm. No murmurs. Abdomen is soft. She has no fluid wave. There is no distention. There is no guarding. His liver edge is nonpalpable. Spleen is about 3 cm below the left costal margin. Extremities shows some trace edema in his lower legs. He has no joint swelling. His grip strength. Skin exam no rashes. Neurological exam is nonfocal.  Lab Results  Component Value Date   WBC 10.2* 01/01/2014   HGB 6.8* 01/01/2014   HCT 22.5* 01/01/2014   MCV 97 01/01/2014   PLT 484* 01/01/2014     Chemistry      Component Value Date/Time   NA 139 01/01/2014 1341   NA 142 10/01/2013 1256   NA 139 09/14/2013 0410   K 4.3 01/01/2014 1341   K 4.5 10/01/2013 1256   K 4.7 09/14/2013 0410   CL 98 01/01/2014 1341   CL 103 09/14/2013 0410   CO2 30 01/01/2014 1341   CO2 27 10/01/2013 1256   CO2 22 09/14/2013 0410   BUN 24* 01/01/2014 1341   BUN  18.0 10/01/2013 1256   BUN 20 09/14/2013 0410   CREATININE 1.5* 01/01/2014 1341   CREATININE 1.3 10/01/2013 1256   CREATININE 1.52* 09/14/2013 0410      Component Value Date/Time   CALCIUM 9.0 01/01/2014 1341   CALCIUM 9.0 10/01/2013 1256   CALCIUM 8.6 09/14/2013 0410   ALKPHOS 27 01/01/2014 1341   ALKPHOS 39* 10/01/2013 1256   ALKPHOS 33* 09/10/2013 1719   AST 18 01/01/2014 1341   AST 17 10/01/2013 1256   AST 15 09/10/2013 1719   ALT 23 01/01/2014 1341   ALT 27 10/01/2013 1256   ALT 23 09/10/2013 1719   BILITOT 1.00 01/01/2014 1341   BILITOT 0.58 10/01/2013 1256   BILITOT 0.5 09/10/2013 1719      LDH is 245.   Impression and Plan: Brian Bowen is a 63 year old gentleman with myelofibrosis. He is on Jakafi which really helps minimize his systemic  symptoms.  He is asymptomatic with his hemoglobin. He will always know when he is to be transfused. Again, we have not transfuse him for a couple months.  We will plan to get him back to see [place] in another 6 weeks.  Hopefully, he will be to take the danazol on a more regular basis.   Volanda Napoleon, MD 6/6/20157:03 AM

## 2014-01-04 LAB — IRON AND TIBC CHCC
%SAT: 65 % — ABNORMAL HIGH (ref 20–55)
IRON: 233 ug/dL — AB (ref 42–163)
TIBC: 356 ug/dL (ref 202–409)
UIBC: 124 ug/dL (ref 117–376)

## 2014-01-04 LAB — FERRITIN CHCC

## 2014-01-05 ENCOUNTER — Other Ambulatory Visit: Payer: Self-pay | Admitting: Hematology & Oncology

## 2014-01-12 ENCOUNTER — Other Ambulatory Visit: Payer: Self-pay | Admitting: *Deleted

## 2014-01-12 MED ORDER — RUXOLITINIB PHOSPHATE 5 MG PO TABS: 5.0000 mg | ORAL_TABLET | Freq: Two times a day (BID) | ORAL | Status: DC

## 2014-02-02 ENCOUNTER — Telehealth: Payer: Self-pay | Admitting: Hematology & Oncology

## 2014-02-02 NOTE — Telephone Encounter (Signed)
[employer] has APPROVED the JAKAFI tablet for patient.  Auth: [id number] Valid: 01/28/2014 - 02/01/2015    COPY SCANNED

## 2014-02-08 ENCOUNTER — Other Ambulatory Visit: Payer: Self-pay | Admitting: Hematology & Oncology

## 2014-02-10 ENCOUNTER — Other Ambulatory Visit: Payer: Self-pay | Admitting: Hematology & Oncology

## 2014-02-10 ENCOUNTER — Telehealth: Payer: Self-pay | Admitting: Hematology & Oncology

## 2014-02-10 NOTE — Telephone Encounter (Signed)
Pt moved 7-17 to 8-3 he cut his leg and has a bad infection. RN aware

## 2014-02-11 ENCOUNTER — Other Ambulatory Visit: Payer: Self-pay | Admitting: Hematology & Oncology

## 2014-02-11 ENCOUNTER — Encounter: Payer: Self-pay | Admitting: *Deleted

## 2014-02-11 NOTE — Progress Notes (Signed)
Informed MD that pt cut his leg and has bad infection; it is hard for him to walk. Pt re-scheduled his appt due to this injury. MD verbalized he is okay with the re-scheduling.

## 2014-02-12 ENCOUNTER — Other Ambulatory Visit: Payer: BC Managed Care – PPO | Admitting: Lab

## 2014-02-12 ENCOUNTER — Ambulatory Visit: Payer: BC Managed Care – PPO | Admitting: Hematology & Oncology

## 2014-03-01 ENCOUNTER — Encounter: Payer: Self-pay | Admitting: *Deleted

## 2014-03-01 ENCOUNTER — Ambulatory Visit (HOSPITAL_BASED_OUTPATIENT_CLINIC_OR_DEPARTMENT_OTHER): Payer: BC Managed Care – PPO | Admitting: Hematology & Oncology

## 2014-03-01 ENCOUNTER — Encounter: Payer: Self-pay | Admitting: Hematology & Oncology

## 2014-03-01 ENCOUNTER — Ambulatory Visit (HOSPITAL_COMMUNITY)
Admission: RE | Admit: 2014-03-01 | Discharge: 2014-03-01 | Disposition: A | Discharge status: Home / Self Care | Payer: BC Managed Care – PPO | Source: Ambulatory Visit | Attending: Hematology & Oncology | Admitting: Hematology & Oncology

## 2014-03-01 ENCOUNTER — Other Ambulatory Visit (HOSPITAL_BASED_OUTPATIENT_CLINIC_OR_DEPARTMENT_OTHER): Payer: BC Managed Care – PPO | Admitting: Lab

## 2014-03-01 VITALS — BP 144/52 | HR 85 | Temp 98.8°F | Resp 18 | Ht 68.0 in | Wt 182.0 lb

## 2014-03-01 DIAGNOSIS — D474 Osteomyelofibrosis: Secondary | ICD-10-CM

## 2014-03-01 DIAGNOSIS — D7581 Myelofibrosis: Secondary | ICD-10-CM

## 2014-03-01 DIAGNOSIS — D649 Anemia, unspecified: Secondary | ICD-10-CM | POA: Insufficient documentation

## 2014-03-01 DIAGNOSIS — R609 Edema, unspecified: Secondary | ICD-10-CM

## 2014-03-01 DIAGNOSIS — D471 Chronic myeloproliferative disease: Secondary | ICD-10-CM | POA: Insufficient documentation

## 2014-03-01 LAB — CBC WITH DIFFERENTIAL (CANCER CENTER ONLY)
BASO#: 0 10*3/uL (ref 0.0–0.2)
BASO%: 0.3 % (ref 0.0–2.0)
EOS%: 0.1 % (ref 0.0–7.0)
Eosinophils Absolute: 0 10*3/uL (ref 0.0–0.5)
HCT: 20.4 % — ABNORMAL LOW (ref 38.7–49.9)
HGB: 6.1 g/dL — CL (ref 13.0–17.1)
LYMPH#: 0.3 10*3/uL — ABNORMAL LOW (ref 0.9–3.3)
LYMPH%: 2.2 % — AB (ref 14.0–48.0)
MCH: 28.6 pg (ref 28.0–33.4)
MCHC: 29.9 g/dL — ABNORMAL LOW (ref 32.0–35.9)
MCV: 96 fL (ref 82–98)
MONO#: 1 10*3/uL — ABNORMAL HIGH (ref 0.1–0.9)
MONO%: 7.2 % (ref 0.0–13.0)
NEUT%: 90.2 % — ABNORMAL HIGH (ref 40.0–80.0)
NEUTROS ABS: 13.1 10*3/uL — AB (ref 1.5–6.5)
Platelets: 400 10*3/uL (ref 145–400)
RBC: 2.13 10*6/uL — ABNORMAL LOW (ref 4.20–5.70)
RDW: 26.3 % — AB (ref 11.1–15.7)
WBC: 14.5 10*3/uL — ABNORMAL HIGH (ref 4.0–10.0)

## 2014-03-01 LAB — LACTATE DEHYDROGENASE: LDH: 246 U/L (ref 94–250)

## 2014-03-01 LAB — TECHNOLOGIST REVIEW CHCC SATELLITE

## 2014-03-01 LAB — RETICULOCYTES (CHCC)
ABS RETIC: 70.4 10*3/uL (ref 19.0–186.0)
RBC.: 2.2 MIL/uL — AB (ref 4.22–5.81)
RETIC CT PCT: 3.2 % — AB (ref 0.4–2.3)

## 2014-03-01 LAB — CMP (CANCER CENTER ONLY)
ALK PHOS: 31 U/L (ref 26–84)
ALT: 24 U/L (ref 10–47)
AST: 19 U/L (ref 11–38)
Albumin: 2.9 g/dL — ABNORMAL LOW (ref 3.3–5.5)
BUN: 22 mg/dL (ref 7–22)
CHLORIDE: 101 meq/L (ref 98–108)
CO2: 26 mEq/L (ref 18–33)
Calcium: 7.8 mg/dL — ABNORMAL LOW (ref 8.0–10.3)
Creat: 1.7 mg/dl — ABNORMAL HIGH (ref 0.6–1.2)
Glucose, Bld: 140 mg/dL — ABNORMAL HIGH (ref 73–118)
POTASSIUM: 3.4 meq/L (ref 3.3–4.7)
SODIUM: 140 meq/L (ref 128–145)
TOTAL PROTEIN: 6.4 g/dL (ref 6.4–8.1)
Total Bilirubin: 0.9 mg/dl (ref 0.20–1.60)

## 2014-03-01 LAB — CHCC SATELLITE - SMEAR

## 2014-03-01 LAB — PREPARE RBC (CROSSMATCH)

## 2014-03-01 NOTE — Progress Notes (Signed)
Hematology and Oncology Follow Up Visit  ARNULFO BATSON [id number] 06/06/1951 63 y.o. 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  #2 prednisone 20 mg by mouth daily  #3 Jakafi 5mg  po bid     Interim History:  Mr.  Sidor is back for followup. His recent problem has been a skin infection in his left lower leg. He probably had a MRSA infection. He was treated by his local physician. He is a treatment helped quite a bit.  He is still more fatigued. He usually gets this way when he needs blood. He's had more swelling in his lower legs.  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  Do not have to transfuse him for probably 4 or 5 months.  He clearly is more cushingoid. I will cut his present dose down to 10 mg a day for 2 weeks and have him stop the prednisone.  His heart has not caused him much problems. He's had no issues with the atrial flutter.  He's had no bleeding. He's had no nausea vomiting. He's had no cough. He does get more short of breath with exertion.  His last iron studies in June and showed a ferritin of 3000 with an iron saturation of 65%. Medications: Current outpatient prescriptions:danazol (DANOCRINE) 200 MG capsule, TAKE ONE CAPSULE BY MOUTH 3 TIMES A DAY, Disp: 90 capsule, Rfl: 1;  metolazone (ZAROXOLYN) 2.5 MG tablet, Take 1 tablet (2.5 mg total) by mouth as needed., Disp: 30 tablet, Rfl: 4;  omeprazole (PRILOSEC) 20 MG capsule, Take 20 mg by mouth every morning. , Disp: , Rfl: ;  predniSONE (DELTASONE) 20 MG tablet, TAKE 1 TABLET BY MOUTH EVERY DAY WITH BREAKFAST, Disp: 30 tablet, Rfl: 3 ruxolitinib phosphate (JAKAFI) 5 MG tablet, Take 1 tablet (5 mg total) by mouth 2 (two) times daily., Disp: 60 tablet, Rfl: 6;  ULORIC 40 MG tablet, TAKE 1  TABLET BY MOUTH EVERY DAY, Disp: 30 tablet, Rfl: 4;  levofloxacin (LEVAQUIN) 750 MG tablet, Take 750 mg by mouth daily. completed, Disp: , Rfl:   Allergies: No Known Allergies  Past Medical History, Surgical history, Social history, and Family History were reviewed and updated.  Review of Systems: As above  Physical Exam:  height is 5\' 8"  (1.727 m) and weight is 182 lb (82.555 kg). His oral temperature is 98.8 F (37.1 C). His blood pressure is 144/52 and his pulse is 85. His respiration is 18.   Cushingoid appearing gentleman in no obvious distress. Head and neck exam shows no ocular or oral lesions. Again, and he does have a cushingoid face. There is no adenopathy in his neck. Lungs are clear. Cardiac exam regular in rhythm. He has a 1/6 systolic ejection murmur. Abdomen is soft. Has good bowel sounds. There is no fluid wave. There is no palpable abdominal mass. His liver is nonpalpable. Spleen is about 3-4 centimeters  below left costal margin. Back exam no tenderness over the spine ribs or hips. Extremities shows no clubbing or cyanosis. He has about 2+ edema in his lower legs and feet. Neurological exam is nonfocal.   . Lab Results  Component Value Date   WBC 14.5* 03/01/2014   HGB 6.1* 03/01/2014   HCT 20.4* 03/01/2014   MCV 96 03/01/2014   PLT  400 03/01/2014     Chemistry      Component Value Date/Time   NA 140 03/01/2014 1451   NA 142 10/01/2013 1256   NA 139 09/14/2013 0410   K 3.4 03/01/2014 1451   K 4.5 10/01/2013 1256   K 4.7 09/14/2013 0410   CL 101 03/01/2014 1451   CL 103 09/14/2013 0410   CO2 26 03/01/2014 1451   CO2 27 10/01/2013 1256   CO2 22 09/14/2013 0410   BUN 22 03/01/2014 1451   BUN 18.0 10/01/2013 1256   BUN 20 09/14/2013 0410   CREATININE 1.7* 03/01/2014 1451   CREATININE 1.3 10/01/2013 1256   CREATININE 1.52* 09/14/2013 0410      Component Value Date/Time   CALCIUM 7.8* 03/01/2014 1451   CALCIUM 9.0 10/01/2013 1256   CALCIUM 8.6 09/14/2013 0410   ALKPHOS 31 03/01/2014 1451   ALKPHOS  39* 10/01/2013 1256   ALKPHOS 33* 09/10/2013 1719   AST 19 03/01/2014 1451   AST 17 10/01/2013 1256   AST 15 09/10/2013 1719   ALT 24 03/01/2014 1451   ALT 27 10/01/2013 1256   ALT 23 09/10/2013 1719   BILITOT 0.90 03/01/2014 1451   BILITOT 0.58 10/01/2013 1256   BILITOT 0.5 09/10/2013 1719         Impression and Plan: Mr. Fellman is 63 year old gentleman with myelofibrosis. He's done well with his to date. We really have not had to be issues with his saw the anemia. The JAKAFI that he is taking might be the source of the anemia. We have with a very low dose.  He's not been transfused for over 4 months which is a real blessing.  We will go ahead and transfuse him tomorrow. This will help him.  I don't think iron overload is a problem for right now.  I want to see him back in another month.  I spent about 30 minutes with him today. I went over his lab work with him. I reviewed his blood smear. He has chronic changes on blood smear consistent with his myelofibrosis. I don't see any evidence of the myelofibrosis with transformation to leukemia.   Volanda Napoleon, MD 8/3/20156:15 PM

## 2014-03-01 NOTE — Addendum Note (Signed)
Addended by: Volanda Napoleon on: 03/01/2014 06:28 PM   Modules accepted: Orders

## 2014-03-02 ENCOUNTER — Telehealth: Payer: Self-pay | Admitting: Hematology & Oncology

## 2014-03-02 ENCOUNTER — Ambulatory Visit (HOSPITAL_BASED_OUTPATIENT_CLINIC_OR_DEPARTMENT_OTHER): Payer: BC Managed Care – PPO

## 2014-03-02 VITALS — BP 133/65 | HR 79 | Temp 98.4°F | Resp 18

## 2014-03-02 DIAGNOSIS — D649 Anemia, unspecified: Secondary | ICD-10-CM

## 2014-03-02 DIAGNOSIS — D474 Osteomyelofibrosis: Secondary | ICD-10-CM

## 2014-03-02 DIAGNOSIS — D7581 Myelofibrosis: Secondary | ICD-10-CM

## 2014-03-02 LAB — IRON AND TIBC CHCC
%SAT: 16 % — ABNORMAL LOW (ref 20–55)
Iron: 53 ug/dL (ref 42–163)
TIBC: 323 ug/dL (ref 202–409)
UIBC: 270 ug/dL (ref 117–376)

## 2014-03-02 LAB — FERRITIN CHCC: Ferritin: 3885 ng/ml — ABNORMAL HIGH (ref 22–316)

## 2014-03-02 LAB — HOLD TUBE, BLOOD BANK - CHCC SATELLITE

## 2014-03-02 MED ORDER — FUROSEMIDE 10 MG/ML IJ SOLN
INTRAMUSCULAR | Status: AC
  Filled 2014-03-02: qty 4

## 2014-03-02 MED ORDER — ACETAMINOPHEN 325 MG PO TABS
650.0000 mg | ORAL_TABLET | Freq: Once | ORAL | Status: AC
  Administered 2014-03-02: 650 mg via ORAL

## 2014-03-02 MED ORDER — ACETAMINOPHEN 325 MG PO TABS
ORAL_TABLET | ORAL | Status: AC
  Filled 2014-03-02: qty 2

## 2014-03-02 MED ORDER — DIPHENHYDRAMINE HCL 25 MG PO CAPS
ORAL_CAPSULE | ORAL | Status: AC
  Filled 2014-03-02: qty 1

## 2014-03-02 MED ORDER — DIPHENHYDRAMINE HCL 25 MG PO CAPS
25.0000 mg | ORAL_CAPSULE | Freq: Once | ORAL | Status: AC
  Administered 2014-03-02: 25 mg via ORAL

## 2014-03-02 MED ORDER — FUROSEMIDE 10 MG/ML IJ SOLN
40.0000 mg | Freq: Once | INTRAMUSCULAR | Status: AC
  Administered 2014-03-02: 40 mg via INTRAVENOUS

## 2014-03-02 MED ORDER — SODIUM CHLORIDE 0.9 % IV SOLN
250.0000 mL | Freq: Once | INTRAVENOUS | Status: AC
  Administered 2014-03-02: 250 mL via INTRAVENOUS

## 2014-03-02 NOTE — Telephone Encounter (Signed)
[employer] [place]   [id number] HC TRANSFUSE 2 UNIT BLOOD/COMPO J7050 PR NORMAL SALINE SOLUTION INFUS J1940 PR FUROSEMIDE INJECTION Q0163 PR DIPHENHYDRAMINE HCL  J3490 PR DRUGS UNCLASSIFIED INJECTION 96374 PR INJECTION,THERAP/PROPH/DIAGNOST, IV PUSH, INITIAL DRUG  I spoke w Rachelle T on today  Myelofibrosis with myeloid metaplasia - Primary 238.76 P: [phone]

## 2014-03-02 NOTE — Patient Instructions (Signed)

## 2014-03-03 ENCOUNTER — Encounter: Payer: Self-pay | Admitting: Hematology & Oncology

## 2014-03-03 LAB — TYPE AND SCREEN
ABO/RH(D): O POS
ANTIBODY SCREEN: NEGATIVE
UNIT DIVISION: 0
Unit division: 0

## 2014-03-04 ENCOUNTER — Other Ambulatory Visit: Payer: Self-pay | Admitting: Hematology & Oncology

## 2014-03-19 ENCOUNTER — Ambulatory Visit (INDEPENDENT_AMBULATORY_CARE_PROVIDER_SITE_OTHER): Payer: BC Managed Care – PPO | Admitting: Internal Medicine

## 2014-03-19 ENCOUNTER — Encounter: Payer: Self-pay | Admitting: Internal Medicine

## 2014-03-19 VITALS — BP 145/70 | HR 72 | Ht 71.0 in | Wt 179.0 lb

## 2014-03-19 DIAGNOSIS — I4892 Unspecified atrial flutter: Secondary | ICD-10-CM

## 2014-03-19 DIAGNOSIS — R609 Edema, unspecified: Secondary | ICD-10-CM

## 2014-03-19 MED ORDER — PREDNISONE 20 MG PO TABS: 10.0000 mg | ORAL_TABLET | Freq: Every day | ORAL | Status: DC

## 2014-03-19 NOTE — Progress Notes (Signed)
HPI Brian Bowen returns today for followup. He is a pleasant 64 yo man with a h/o atrial flutter who underwent catheter ablation approximately 8 months ago. In the interim, he has done well with no chest pain or sob. He does not feel palpitations but has noted some irregularity on his BP machine, typically occuring one or two days a week.  He has had some peripheral edema, which got worse after hitting his leg and developing MRSA which has been treated with anti-biotics and has resolved.  He denies dietary indiscretion but has a propensity to peripheral edema on steroids. No Known Allergies   Current Outpatient Prescriptions  Medication Sig Dispense Refill  . danazol (DANOCRINE) 200 MG capsule TAKE ONE CAPSULE BY MOUTH 3 TIMES A DAY  90 capsule  1  . levofloxacin (LEVAQUIN) 750 MG tablet Take 750 mg by mouth daily. completed      . metolazone (ZAROXOLYN) 2.5 MG tablet Take 1 tablet (2.5 mg total) by mouth as needed.  30 tablet  4  . omeprazole (PRILOSEC) 20 MG capsule Take 20 mg by mouth every morning.       . predniSONE (DELTASONE) 20 MG tablet Take 0.5 tablets (10 mg total) by mouth daily.  30 tablet  3  . ruxolitinib phosphate (JAKAFI) 5 MG tablet Take 1 tablet (5 mg total) by mouth 2 (two) times daily.  60 tablet  6  . ULORIC 40 MG tablet TAKE 1 TABLET BY MOUTH EVERY DAY  30 tablet  4   No current facility-administered medications for this visit.     Past Medical History  Diagnosis Date  . 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 Atrial flutter     s/p CTI ablation by Dr Lovena Le 08/2013    ROS:   All systems reviewed and negative except as noted in the HPI.   Past Surgical History  Procedure Laterality Date  . Tonsillectomy      age 41 years old  . Back surgery      #6 upper back, nerve cut  . Left arm      broken-plate from elbow down  . Tee without cardioversion N/A 09/14/2013    Procedure: TRANSESOPHAGEAL ECHOCARDIOGRAM (TEE);  Surgeon: Dorothy Spark,  MD;  Location: [facility];  Service: Cardiovascular;  Laterality: N/A;  . Ablation  09/14/2013    CTI ablation by Dr Lovena Le     No family history on file.   History   Social History  . Marital Status: Married    Spouse Name: N/A    Number of Children: N/A  . Years of Education: N/A   Occupational History  . Not on file.   Social History Main Topics  . Smoking status: Former Smoker -- 0.25 packs/day for 13 years    Types: Cigarettes    Start date: 04/21/1971    Quit date: 01/19/1984  . Smokeless tobacco: Never Used     Comment: quit 30 years ago  . Alcohol Use: Yes  . Drug Use: No  . Sexual Activity: Not on file   Other Topics Concern  . Not on file   Social History Narrative  . No narrative on file     BP 145/70  Pulse 72  Ht 5\' 11"  (1.803 m)  Wt 179 lb (81.194 kg)  BMI 24.98 kg/m2  Physical Exam:  Well appearing middle aged man, NAD HEENT: Unremarkable Neck:  No JVD, no thyromegally Back:  No CVA tenderness Lungs:  Clear with no wheezes HEART:  IRegular rate rhythm, no murmurs, no rubs, no clicks Abd:  soft, positive bowel sounds, no organomegally, no rebound, no guarding Ext:  2 plus pulses, tace peripheral edema on the right leg, 1+ edema on the left leg with a healing sore on the anterior aspect of the lower leg, no cyanosis, no clubbing Skin:  No rashes no nodules Neuro:  CN II through XII intact, motor grossly intact  EKG - NSR with PVC"s and PAC's.   Assess/Plan:

## 2014-03-19 NOTE — Patient Instructions (Signed)
Your physician wants you to follow-up in: 6 months. You will receive a reminder letter in the mail two months in advance. If you don't receive a letter, please call our office to schedule the follow-up appointment.  Your physician recommends that you continue on your current medications as directed. Please refer to the Current Medication list given to you today.  I have given you blood pressure tracking sheets.  Thank you for choosing [facility]!!

## 2014-03-21 ENCOUNTER — Encounter: Payer: Self-pay | Admitting: Internal Medicine

## 2014-03-21 NOTE — Assessment & Plan Note (Signed)
He has had no recurrence. He does have palpitations and he will undergo watchful waiting.

## 2014-03-21 NOTE — Assessment & Plan Note (Signed)
We discussed the importance of a low sodium diet. He will try and keep his legs elevated as he can.

## 2014-03-31 ENCOUNTER — Encounter: Payer: Self-pay | Admitting: Hematology & Oncology

## 2014-03-31 ENCOUNTER — Ambulatory Visit (HOSPITAL_BASED_OUTPATIENT_CLINIC_OR_DEPARTMENT_OTHER): Payer: BC Managed Care – PPO | Admitting: Hematology & Oncology

## 2014-03-31 ENCOUNTER — Other Ambulatory Visit (HOSPITAL_BASED_OUTPATIENT_CLINIC_OR_DEPARTMENT_OTHER): Payer: BC Managed Care – PPO | Admitting: Lab

## 2014-03-31 VITALS — BP 154/61 | HR 84 | Temp 98.4°F | Resp 98 | Ht 70.0 in | Wt 184.0 lb

## 2014-03-31 DIAGNOSIS — D7581 Myelofibrosis: Secondary | ICD-10-CM

## 2014-03-31 DIAGNOSIS — D474 Osteomyelofibrosis: Secondary | ICD-10-CM

## 2014-03-31 LAB — CBC WITH DIFFERENTIAL (CANCER CENTER ONLY)
BASO#: 0 10*3/uL (ref 0.0–0.2)
BASO%: 0.5 % (ref 0.0–2.0)
EOS%: 0 % (ref 0.0–7.0)
Eosinophils Absolute: 0 10*3/uL (ref 0.0–0.5)
HEMATOCRIT: 23.4 % — AB (ref 38.7–49.9)
HEMOGLOBIN: 6.9 g/dL — AB (ref 13.0–17.1)
LYMPH#: 0.7 10*3/uL — ABNORMAL LOW (ref 0.9–3.3)
LYMPH%: 8.4 % — AB (ref 14.0–48.0)
MCH: 28 pg (ref 28.0–33.4)
MCHC: 29.5 g/dL — ABNORMAL LOW (ref 32.0–35.9)
MCV: 95 fL (ref 82–98)
MONO#: 0.7 10*3/uL (ref 0.1–0.9)
MONO%: 8.8 % (ref 0.0–13.0)
NEUT#: 6.8 10*3/uL — ABNORMAL HIGH (ref 1.5–6.5)
NEUT%: 82.3 % — ABNORMAL HIGH (ref 40.0–80.0)
PLATELETS: 378 10*3/uL (ref 145–400)
RBC: 2.46 10*6/uL — ABNORMAL LOW (ref 4.20–5.70)
RDW: 22.8 % — ABNORMAL HIGH (ref 11.1–15.7)
WBC: 8.2 10*3/uL (ref 4.0–10.0)

## 2014-03-31 LAB — CHCC SATELLITE - SMEAR

## 2014-03-31 LAB — RETICULOCYTES (CHCC)
ABS Retic: 38.4 10*3/uL (ref 19.0–186.0)
RBC.: 2.56 MIL/uL — ABNORMAL LOW (ref 4.22–5.81)
Retic Ct Pct: 1.5 % (ref 0.4–2.3)

## 2014-03-31 LAB — COMPREHENSIVE METABOLIC PANEL
ALK PHOS: 28 U/L — AB (ref 39–117)
ALT: 13 U/L (ref 0–53)
AST: 11 U/L (ref 0–37)
Albumin: 3.9 g/dL (ref 3.5–5.2)
BILIRUBIN TOTAL: 0.7 mg/dL (ref 0.2–1.2)
BUN: 17 mg/dL (ref 6–23)
CALCIUM: 8.5 mg/dL (ref 8.4–10.5)
CO2: 23 meq/L (ref 19–32)
Chloride: 108 mEq/L (ref 96–112)
Creatinine, Ser: 1.4 mg/dL — ABNORMAL HIGH (ref 0.50–1.35)
GLUCOSE: 97 mg/dL (ref 70–99)
Potassium: 4 mEq/L (ref 3.5–5.3)
Sodium: 140 mEq/L (ref 135–145)
Total Protein: 6.1 g/dL (ref 6.0–8.3)

## 2014-03-31 LAB — LACTATE DEHYDROGENASE: LDH: 235 U/L (ref 94–250)

## 2014-03-31 LAB — TECHNOLOGIST REVIEW CHCC SATELLITE

## 2014-03-31 LAB — HOLD TUBE, BLOOD BANK - CHCC SATELLITE

## 2014-03-31 NOTE — Progress Notes (Signed)
Hematology and Oncology Follow Up Visit  Brian Bowen [id number] 16-Jun-1951 63 y.o. 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  #2 prednisone 10 mg by mouth daily  #3 Jakafi 5mg  po bid     Interim History:  Mr.  Citro is back for followup. He is doing pretty well. When she was off prednisone, he had some dizziness. He got back on prednisone. He's on 10 mg a right now. I told him to try to go to 5 mg a day.  He's been feeling well otherwise. It's a busy time of year for him. 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  He's had no problems with his heart. He has the atrial fibrillation/flutter. He's on no anticoagulation for this.  He is doing well with the [place]. He's had no nausea or vomiting. He's had no abdominal pain. He's had no change in bowel or bladder habits.  Today, is his the 40th wedding anniversary.  Medications: Current outpatient prescriptions:danazol (DANOCRINE) 200 MG capsule, TAKE ONE CAPSULE BY MOUTH 3 TIMES A DAY, Disp: 90 capsule, Rfl: 1;  metolazone (ZAROXOLYN) 2.5 MG tablet, Take 1 tablet (2.5 mg total) by mouth as needed., Disp: 30 tablet, Rfl: 4;  omeprazole (PRILOSEC) 20 MG capsule, Take 20 mg by mouth every morning. , Disp: , Rfl:  predniSONE (DELTASONE) 20 MG tablet, Take 5 mg by mouth daily. 03-31-14 DECREASE TO 5 MG DAILY, IF SYMPTOMATIC INCREASE TO 10 MG. PER DR. Marin Olp., Disp: , Rfl: ;  ruxolitinib phosphate (JAKAFI) 5 MG tablet, Take 5 mg by mouth 2 (two) times daily. Total of 10 mg., Disp: , Rfl: ;  ULORIC 40 MG tablet, TAKE 1 TABLET BY MOUTH EVERY DAY, Disp: 30 tablet, Rfl: 4  Allergies: No Known Allergies  Past Medical History, Surgical history, Social history, and Family History were reviewed and updated.  Review  of Systems: As above  Physical Exam:  height is 5\' 10"  (1.778 m) and weight is 184 lb (83.462 kg). His oral temperature is 98.4 F (36.9 C). His blood pressure is 154/61 and his pulse is 84. His respiration is 98.   Well-developed and well-nourished white cell and. Head and neck exam shows no ocular or oral lesions. He has no palpable cervical or supraclavicular lymph nodes. Lungs are clear. Cardiac exam regular rate and rhythm consistent with fibrillation. He has a 1/6 systolic ejection murmur. Abdomen soft. Has good bowel sounds. There is no fluid wave. There is no palpable abdominal mass. There is no palpable liver tip. His spleen tip might be just below the left costal margin. Back exam no tenderness over the spine ribs or hips. Extremities shows no clubbing or cyanosis. He has 1-2+ edema in his lower legs. Skin exam shows no rashes, ecchymosis or petechia. Neurological exam shows no focal neurological deficits.  Lab Results  Component Value Date   WBC 8.2 03/31/2014   HGB 6.9* 03/31/2014   HCT 23.4* 03/31/2014   MCV 95 03/31/2014   PLT 378 03/31/2014     Chemistry      Component Value Date/Time   NA 140 03/01/2014 1451   NA 142 10/01/2013 1256   NA 139 09/14/2013 0410   K 3.4 03/01/2014 1451   K 4.5 10/01/2013 1256  K 4.7 09/14/2013 0410   CL 101 03/01/2014 1451   CL 103 09/14/2013 0410   CO2 26 03/01/2014 1451   CO2 27 10/01/2013 1256   CO2 22 09/14/2013 0410   BUN 22 03/01/2014 1451   BUN 18.0 10/01/2013 1256   BUN 20 09/14/2013 0410   CREATININE 1.7* 03/01/2014 1451   CREATININE 1.3 10/01/2013 1256   CREATININE 1.52* 09/14/2013 0410      Component Value Date/Time   CALCIUM 7.8* 03/01/2014 1451   CALCIUM 9.0 10/01/2013 1256   CALCIUM 8.6 09/14/2013 0410   ALKPHOS 31 03/01/2014 1451   ALKPHOS 39* 10/01/2013 1256   ALKPHOS 33* 09/10/2013 1719   AST 19 03/01/2014 1451   AST 17 10/01/2013 1256   AST 15 09/10/2013 1719   ALT 24 03/01/2014 1451   ALT 27 10/01/2013 1256   ALT 23 09/10/2013 1719   BILITOT 0.90 03/01/2014  1451   BILITOT 0.58 10/01/2013 1256   BILITOT 0.5 09/10/2013 1719         Impression and Plan: [name] is 63 year old gentleman with myelofibrosis. Again, he isJAK2  positive. He's on JAKAFI which helps control his systemic symptoms incredibly well.  He is anemic but totally asymptomatic. He did not need to be transfused.  For now, we will just plan to get him back in another 5 or 6 weeks.  He will go on to 5 mg of prednisone daily.  I think that he will have the edema in his legs. Again, he is deaf leg anemic and I think, the anemia is the most likely cause for the edema in his lower legs.     Volanda Napoleon, MD 9/2/20155:38 PM

## 2014-04-01 LAB — IRON AND TIBC CHCC
%SAT: 37 % (ref 20–55)
Iron: 100 ug/dL (ref 42–163)
TIBC: 268 ug/dL (ref 202–409)
UIBC: 167 ug/dL (ref 117–376)

## 2014-04-01 LAB — FERRITIN CHCC: Ferritin: 3577 ng/ml — ABNORMAL HIGH (ref 22–316)

## 2014-04-30 ENCOUNTER — Other Ambulatory Visit: Payer: BC Managed Care – PPO | Admitting: Lab

## 2014-04-30 ENCOUNTER — Ambulatory Visit: Payer: BC Managed Care – PPO | Admitting: Hematology & Oncology

## 2014-05-06 ENCOUNTER — Telehealth: Payer: Self-pay | Admitting: Hematology & Oncology

## 2014-05-06 ENCOUNTER — Other Ambulatory Visit: Payer: BC Managed Care – PPO | Admitting: Lab

## 2014-05-06 ENCOUNTER — Ambulatory Visit: Payer: BC Managed Care – PPO | Admitting: Hematology & Oncology

## 2014-05-06 NOTE — Telephone Encounter (Signed)
Patient called cx 05/06/14 apt and stated he will call back to resch

## 2014-05-10 ENCOUNTER — Other Ambulatory Visit: Payer: Self-pay | Admitting: Nurse Practitioner

## 2014-05-10 MED ORDER — DANAZOL 200 MG PO CAPS: ORAL_CAPSULE | ORAL | Status: DC

## 2014-05-12 ENCOUNTER — Other Ambulatory Visit: Payer: Self-pay | Admitting: Nurse Practitioner

## 2014-05-12 MED ORDER — DANAZOL 200 MG PO CAPS: ORAL_CAPSULE | ORAL | Status: DC

## 2014-06-03 ENCOUNTER — Ambulatory Visit: Payer: BC Managed Care – PPO | Admitting: Hematology & Oncology

## 2014-06-03 ENCOUNTER — Other Ambulatory Visit: Payer: BC Managed Care – PPO | Admitting: Lab

## 2014-06-10 ENCOUNTER — Other Ambulatory Visit: Payer: Self-pay | Admitting: *Deleted

## 2014-06-10 ENCOUNTER — Ambulatory Visit (HOSPITAL_BASED_OUTPATIENT_CLINIC_OR_DEPARTMENT_OTHER): Payer: BC Managed Care – PPO | Admitting: Hematology & Oncology

## 2014-06-10 ENCOUNTER — Ambulatory Visit (HOSPITAL_COMMUNITY)
Admission: RE | Admit: 2014-06-10 | Discharge: 2014-06-10 | Disposition: A | Discharge status: Home / Self Care | Payer: BLUE CROSS/BLUE SHIELD | Source: Ambulatory Visit | Attending: Hematology & Oncology | Admitting: Hematology & Oncology

## 2014-06-10 ENCOUNTER — Other Ambulatory Visit (HOSPITAL_BASED_OUTPATIENT_CLINIC_OR_DEPARTMENT_OTHER): Payer: BC Managed Care – PPO | Admitting: Lab

## 2014-06-10 VITALS — BP 149/74 | HR 92 | Temp 97.3°F | Resp 16 | Wt 177.0 lb

## 2014-06-10 DIAGNOSIS — D474 Osteomyelofibrosis: Secondary | ICD-10-CM

## 2014-06-10 DIAGNOSIS — D7581 Myelofibrosis: Secondary | ICD-10-CM

## 2014-06-10 DIAGNOSIS — R609 Edema, unspecified: Secondary | ICD-10-CM

## 2014-06-10 DIAGNOSIS — C801 Malignant (primary) neoplasm, unspecified: Secondary | ICD-10-CM

## 2014-06-10 LAB — CMP (CANCER CENTER ONLY)
ALBUMIN: 2.9 g/dL — AB (ref 3.3–5.5)
ALK PHOS: 39 U/L (ref 26–84)
ALT: 24 U/L (ref 10–47)
AST: 20 U/L (ref 11–38)
BUN, Bld: 21 mg/dL (ref 7–22)
CHLORIDE: 103 meq/L (ref 98–108)
CO2: 24 mEq/L (ref 18–33)
Calcium: 8.8 mg/dL (ref 8.0–10.3)
Creat: 1.9 mg/dl — ABNORMAL HIGH (ref 0.6–1.2)
Glucose, Bld: 88 mg/dL (ref 73–118)
POTASSIUM: 4.2 meq/L (ref 3.3–4.7)
SODIUM: 136 meq/L (ref 128–145)
TOTAL PROTEIN: 6.3 g/dL — AB (ref 6.4–8.1)
Total Bilirubin: 0.6 mg/dl (ref 0.20–1.60)

## 2014-06-10 LAB — MANUAL DIFFERENTIAL (CHCC SATELLITE)
ALC: 0.6 10*3/uL — ABNORMAL LOW (ref 0.9–3.3)
ANC (CHCC HP manual diff): 6.4 10*3/uL (ref 1.5–6.5)
LYMPH: 8 % — ABNORMAL LOW (ref 14–48)
METAMYELOCYTES PCT: 1 % — AB (ref 0–0)
MONO: 9 % (ref 0–13)
MYELOCYTES: 3 % — AB (ref 0–0)
NRBC: 1 % — AB (ref 0–0)
PLATELET MORPHOLOGY: NORMAL
PLT EST ~~LOC~~: ADEQUATE
SEG: 79 % — ABNORMAL HIGH (ref 40–75)

## 2014-06-10 LAB — CBC WITH DIFFERENTIAL (CANCER CENTER ONLY)
HEMATOCRIT: 15.9 % — AB (ref 38.7–49.9)
HGB: 4.7 g/dL — CL (ref 13.0–17.1)
MCH: 27.5 pg — ABNORMAL LOW (ref 28.0–33.4)
MCHC: 29.6 g/dL — AB (ref 32.0–35.9)
MCV: 93 fL (ref 82–98)
Platelets: 398 10*3/uL (ref 145–400)
RBC: 1.71 10*6/uL — ABNORMAL LOW (ref 4.20–5.70)
RDW: 29.1 % — ABNORMAL HIGH (ref 11.1–15.7)
WBC: 7.8 10*3/uL (ref 4.0–10.0)

## 2014-06-10 LAB — HOLD TUBE, BLOOD BANK - CHCC SATELLITE

## 2014-06-10 LAB — PREPARE RBC (CROSSMATCH)

## 2014-06-10 MED ORDER — INFLUENZA VAC SPLIT QUAD 0.5 ML IM SUSY
0.5000 mL | PREFILLED_SYRINGE | Freq: Once | INTRAMUSCULAR | Status: DC
  Filled 2014-06-10: qty 0.5

## 2014-06-10 NOTE — Progress Notes (Signed)
Hematology and Oncology Follow Up Visit  Brian Bowen [id number] October 26, 1950 63 y.o. 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  #2 prednisone 10 mg by mouth daily  #3 Jakafi 5mg  po bid     Interim History:  [name] is back for followup. He is not doing as well. He does get a little bit more dizzy. He does feel as if his blood is on the low side.  He's not noted any problems with fever. He's had no change in bowel or bladder habits. 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 The weather has just not been good for him to get work done that needs to get done.   He's had no problems with his heart. He has the atrial fibrillation/flutter. He's on no anticoagulation for this.  He is doing well with the [place]. He's had no nausea or vomiting. He's had no abdominal pain. He's had no change in bowel or bladder habits.    Medications: Current outpatient prescriptions: predniSONE (DELTASONE) 10 MG tablet, Take 2.5 mg by mouth daily with breakfast., Disp: , Rfl: ;  danazol (DANOCRINE) 200 MG capsule, TAKE ONE CAPSULE BY MOUTH 3 TIMES A DAY, Disp: 90 capsule, Rfl: 3;  metolazone (ZAROXOLYN) 2.5 MG tablet, Take 1 tablet (2.5 mg total) by mouth as needed., Disp: 30 tablet, Rfl: 4;  omeprazole (PRILOSEC) 20 MG capsule, Take 20 mg by mouth every morning. , Disp: , Rfl:  ruxolitinib phosphate (JAKAFI) 5 MG tablet, Take 5 mg by mouth 2 (two) times daily. Total of 10 mg., Disp: , Rfl: ;  ULORIC 40 MG tablet, TAKE 1 TABLET BY MOUTH EVERY DAY, Disp: 30 tablet, Rfl: 4 Current facility-administered medications: Influenza vac split quadrivalent PF (FLUARIX) injection 0.5 mL, 0.5 mL, Intramuscular, Once, Volanda Napoleon, MD  Allergies: No Known Allergies  Past  Medical History, Surgical history, Social history, and Family History were reviewed and updated.  Review of Systems: As above  Physical Exam:  weight is 177 lb (80.287 kg). His oral temperature is 97.3 F (36.3 C). His blood pressure is 149/74 and his pulse is 92. His respiration is 16.   Well-developed and well-nourished white cell and. Head and neck exam shows no ocular or oral lesions. He has no palpable cervical or supraclavicular lymph nodes. Lungs are clear. Cardiac exam regular rate and rhythm consistent with fibrillation. He has a 1/6 systolic ejection murmur. Abdomen soft. Has good bowel sounds. There is no fluid wave. There is no palpable abdominal mass. There is no palpable liver tip. His spleen tip might be just below the left costal margin. Back exam no tenderness over the spine ribs or hips. Extremities shows no clubbing or cyanosis. He has 1-2+ edema in his lower legs. Skin exam shows no rashes, ecchymosis or petechia. Neurological exam shows no focal neurological deficits.  Lab Results  Component Value Date   WBC 7.8 06/10/2014   HGB 4.7* 06/10/2014   HCT 15.9* 06/10/2014   MCV 93 06/10/2014   PLT 398 06/10/2014     Chemistry      Component Value Date/Time   NA 136 06/10/2014 1442   NA 140 03/31/2014 1449   NA 142 10/01/2013 1256   K 4.2 06/10/2014 1442   K 4.0 03/31/2014 1449  K 4.5 10/01/2013 1256   CL 103 06/10/2014 1442   CL 108 03/31/2014 1449   CO2 24 06/10/2014 1442   CO2 23 03/31/2014 1449   CO2 27 10/01/2013 1256   BUN 21 06/10/2014 1442   BUN 17 03/31/2014 1449   BUN 18.0 10/01/2013 1256   CREATININE 1.9* 06/10/2014 1442   CREATININE 1.40* 03/31/2014 1449   CREATININE 1.3 10/01/2013 1256      Component Value Date/Time   CALCIUM 8.8 06/10/2014 1442   CALCIUM 8.5 03/31/2014 1449   CALCIUM 9.0 10/01/2013 1256   ALKPHOS 39 06/10/2014 1442   ALKPHOS 28* 03/31/2014 1449   ALKPHOS 39* 10/01/2013 1256   AST 20 06/10/2014 1442   AST 11 03/31/2014 1449    AST 17 10/01/2013 1256   ALT 24 06/10/2014 1442   ALT 13 03/31/2014 1449   ALT 27 10/01/2013 1256   BILITOT 0.60 06/10/2014 1442   BILITOT 0.7 03/31/2014 1449   BILITOT 0.58 10/01/2013 1256         Impression and Plan: Brian Bowen is 63 year old gentleman with myelofibrosis. Again, he is JAK2  positive. He's on JAKAFI which helps control his systemic symptoms incredibly well.  He is anemic and is more symptomatic. This is probably the lowest that his hemoglobin has been. We will clearly had to transfuse him tomorrow.  He will go on to 5 mg of prednisone daily.  I think that he will have the edema in his legs. Again, he is deaf leg anemic and I think, the anemia is the most likely cause for the edema in his lower legs.     Volanda Napoleon, MD 11/12/20156:41 PM

## 2014-06-11 ENCOUNTER — Encounter: Payer: Self-pay | Admitting: Hematology & Oncology

## 2014-06-11 ENCOUNTER — Ambulatory Visit (HOSPITAL_BASED_OUTPATIENT_CLINIC_OR_DEPARTMENT_OTHER): Payer: BC Managed Care – PPO

## 2014-06-11 VITALS — BP 135/60 | HR 68 | Temp 98.4°F | Resp 16

## 2014-06-11 DIAGNOSIS — D7581 Myelofibrosis: Secondary | ICD-10-CM

## 2014-06-11 DIAGNOSIS — D474 Osteomyelofibrosis: Secondary | ICD-10-CM | POA: Diagnosis not present

## 2014-06-11 DIAGNOSIS — Z23 Encounter for immunization: Secondary | ICD-10-CM

## 2014-06-11 LAB — IRON AND TIBC CHCC
%SAT: 65 % — ABNORMAL HIGH (ref 20–55)
IRON: 205 ug/dL — AB (ref 42–163)
TIBC: 316 ug/dL (ref 202–409)
UIBC: 111 ug/dL — ABNORMAL LOW (ref 117–376)

## 2014-06-11 LAB — FERRITIN CHCC

## 2014-06-11 MED ORDER — DIPHENHYDRAMINE HCL 25 MG PO CAPS
25.0000 mg | ORAL_CAPSULE | Freq: Once | ORAL | Status: AC
  Administered 2014-06-11: 25 mg via ORAL

## 2014-06-11 MED ORDER — FUROSEMIDE 10 MG/ML IJ SOLN
20.0000 mg | Freq: Once | INTRAMUSCULAR | Status: AC
  Administered 2014-06-11: 20 mg via INTRAVENOUS

## 2014-06-11 MED ORDER — FUROSEMIDE 10 MG/ML IJ SOLN
INTRAMUSCULAR | Status: AC
  Filled 2014-06-11: qty 4

## 2014-06-11 MED ORDER — ACETAMINOPHEN 325 MG PO TABS
ORAL_TABLET | ORAL | Status: AC
  Filled 2014-06-11: qty 2

## 2014-06-11 MED ORDER — INFLUENZA VAC SPLIT QUAD 0.5 ML IM SUSY
0.5000 mL | PREFILLED_SYRINGE | Freq: Once | INTRAMUSCULAR | Status: AC
  Administered 2014-06-11: 0.5 mL via INTRAMUSCULAR
  Filled 2014-06-11: qty 0.5

## 2014-06-11 MED ORDER — ACETAMINOPHEN 325 MG PO TABS
650.0000 mg | ORAL_TABLET | Freq: Once | ORAL | Status: AC
  Administered 2014-06-11: 650 mg via ORAL

## 2014-06-11 MED ORDER — DIPHENHYDRAMINE HCL 25 MG PO CAPS
ORAL_CAPSULE | ORAL | Status: AC
  Filled 2014-06-11: qty 1

## 2014-06-11 NOTE — Patient Instructions (Signed)

## 2014-06-12 LAB — TYPE AND SCREEN
ABO/RH(D): O POS
Antibody Screen: NEGATIVE
UNIT DIVISION: 0
Unit division: 0

## 2014-07-07 ENCOUNTER — Ambulatory Visit (HOSPITAL_BASED_OUTPATIENT_CLINIC_OR_DEPARTMENT_OTHER): Payer: BC Managed Care – PPO | Admitting: Hematology & Oncology

## 2014-07-07 ENCOUNTER — Other Ambulatory Visit (HOSPITAL_BASED_OUTPATIENT_CLINIC_OR_DEPARTMENT_OTHER): Payer: BC Managed Care – PPO | Admitting: Lab

## 2014-07-07 ENCOUNTER — Encounter: Payer: Self-pay | Admitting: Hematology & Oncology

## 2014-07-07 DIAGNOSIS — D474 Osteomyelofibrosis: Secondary | ICD-10-CM

## 2014-07-07 DIAGNOSIS — D7581 Myelofibrosis: Secondary | ICD-10-CM

## 2014-07-07 DIAGNOSIS — I4891 Unspecified atrial fibrillation: Secondary | ICD-10-CM

## 2014-07-07 LAB — CBC WITH DIFFERENTIAL (CANCER CENTER ONLY)
BASO#: 0.1 10*3/uL (ref 0.0–0.2)
BASO%: 1.3 % (ref 0.0–2.0)
EOS ABS: 0 10*3/uL (ref 0.0–0.5)
EOS%: 0 % (ref 0.0–7.0)
HEMATOCRIT: 20.6 % — AB (ref 38.7–49.9)
HEMOGLOBIN: 6.3 g/dL — AB (ref 13.0–17.1)
LYMPH#: 0.8 10*3/uL — ABNORMAL LOW (ref 0.9–3.3)
LYMPH%: 12.8 % — AB (ref 14.0–48.0)
MCH: 28 pg (ref 28.0–33.4)
MCHC: 30.6 g/dL — AB (ref 32.0–35.9)
MCV: 92 fL (ref 82–98)
MONO#: 0.8 10*3/uL (ref 0.1–0.9)
MONO%: 12 % (ref 0.0–13.0)
NEUT#: 4.6 10*3/uL (ref 1.5–6.5)
NEUT%: 73.9 % (ref 40.0–80.0)
Platelets: 373 10*3/uL (ref 145–400)
RBC: 2.25 10*6/uL — AB (ref 4.20–5.70)
RDW: 23.5 % — ABNORMAL HIGH (ref 11.1–15.7)
WBC: 6.3 10*3/uL (ref 4.0–10.0)

## 2014-07-07 LAB — COMPREHENSIVE METABOLIC PANEL
ALBUMIN: 3.9 g/dL (ref 3.5–5.2)
ALK PHOS: 44 U/L (ref 39–117)
ALT: 16 U/L (ref 0–53)
AST: 14 U/L (ref 0–37)
BUN: 26 mg/dL — AB (ref 6–23)
CO2: 23 mEq/L (ref 19–32)
CREATININE: 1.84 mg/dL — AB (ref 0.50–1.35)
Calcium: 8.7 mg/dL (ref 8.4–10.5)
Chloride: 104 mEq/L (ref 96–112)
Glucose, Bld: 83 mg/dL (ref 70–99)
Potassium: 4.1 mEq/L (ref 3.5–5.3)
Sodium: 138 mEq/L (ref 135–145)
Total Bilirubin: 0.6 mg/dL (ref 0.2–1.2)
Total Protein: 6.4 g/dL (ref 6.0–8.3)

## 2014-07-07 LAB — TECHNOLOGIST REVIEW CHCC SATELLITE

## 2014-07-07 LAB — CHCC SATELLITE - SMEAR

## 2014-07-07 LAB — RETICULOCYTES (CHCC)
ABS Retic: 32.9 10*3/uL (ref 19.0–186.0)
RBC.: 2.35 MIL/uL — ABNORMAL LOW (ref 4.22–5.81)
RETIC CT PCT: 1.4 % (ref 0.4–2.3)

## 2014-07-07 LAB — LACTATE DEHYDROGENASE: LDH: 263 U/L — ABNORMAL HIGH (ref 94–250)

## 2014-07-07 NOTE — Progress Notes (Signed)
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 He is feeling much better. The dizziness went away after his transfusion. His Hgb is 6.3 today and he has more energy. He had a great Thanksgiving with his family. 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  He denies fever, chills, n/v, cough, rash, headache, dizziness, SOB, chest pain, palpitations, abdominal pain, constipation, blood in urine or stool.  No swelling, tenderness, numbness or tingling in his extremities.  His appetite is good and he is staying hydrated. His weight is stable.   He has had no problem with his atrial fibrillation/flutter. He is not on any anticoagulation for this.  CURRENT TREATMENT: 1. Danazol 600 mg by mouth daily. 2. Prednisone 2.5 mg by mouth daily 3. Jakafi 5mg  po bid  REVIEW OF SYSTEMS: All other 10 point review of systems is negative.   PAST MEDICAL HISTORY: Past Medical History  Diagnosis Date  . 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 Atrial flutter     s/p CTI ablation by Dr Lovena Le 08/2013    PAST SURGICAL HISTORY: Past Surgical History  Procedure Laterality Date  . Tonsillectomy      age 88 years old  . Back surgery      #6 upper back, nerve cut  . Left arm      broken-plate from elbow down  . Tee without cardioversion N/A 09/14/2013    Procedure: TRANSESOPHAGEAL ECHOCARDIOGRAM (TEE);  Surgeon: Dorothy Spark, MD;  Location: [facility];  Service: Cardiovascular;  Laterality: N/A;  . Ablation  09/14/2013    CTI  ablation by Dr Lovena Le    FAMILY HISTORY History reviewed. No pertinent family history.  GYNECOLOGIC HISTORY:  No LMP for male patient.   SOCIAL HISTORY: History   Social History  . Marital Status: Married    Spouse Name: N/A    Number of Children: N/A  . Years of Education: N/A   Occupational History  . Not on file.   Social History Main Topics  . Smoking status: Former Smoker -- 0.25 packs/day for 13 years    Types: Cigarettes    Start date: 04/21/1971    Quit date: 01/19/1984  . Smokeless tobacco: Never Used     Comment: quit 30 years ago  . Alcohol Use: Yes  . Drug Use: No  . Sexual Activity: Not on file   Other Topics Concern  . Not on file   Social History Narrative    ADVANCED DIRECTIVES:  <no information>  HEALTH MAINTENANCE: History  Substance Use Topics  . Smoking status: Former Smoker -- 0.25 packs/day for 13 years    Types: Cigarettes    Start date: 04/21/1971    Quit date: 01/19/1984  . Smokeless tobacco: Never Used     Comment: quit 30 years ago  . Alcohol Use: Yes   Colonoscopy: PAP: Bone density: Lipid panel:  No Known Allergies  Current Outpatient Prescriptions  Medication Sig Dispense Refill  . danazol (DANOCRINE) 200 MG capsule TAKE ONE CAPSULE BY MOUTH 3 TIMES A DAY 90 capsule 3  . metolazone (ZAROXOLYN) 2.5 MG tablet Take 1 tablet (2.5  mg total) by mouth as needed. 30 tablet 4  . omeprazole (PRILOSEC) 20 MG capsule Take 20 mg by mouth every morning.     . predniSONE (DELTASONE) 10 MG tablet Take 2.5 mg by mouth daily with breakfast.    . pseudoephedrine (SUDAFED) 120 MG 12 hr tablet Take 120 mg by mouth every 12 (twelve) hours as needed for congestion.    . ruxolitinib phosphate (JAKAFI) 5 MG tablet Take 5 mg by mouth 2 (two) times daily. Total of 10 mg.    . ULORIC 40 MG tablet TAKE 1 TABLET BY MOUTH EVERY DAY 30 tablet 4   No current facility-administered medications for this visit.    OBJECTIVE: Filed Vitals:   07/07/14  1551  BP: 164/78  Pulse: 83  Temp: 98.3 F (36.8 C)  Resp: 16    Filed Weights   07/07/14 1551  Weight: 176 lb (79.833 kg)   ECOG FS:0 - Asymptomatic Ocular: Sclerae unicteric, pupils equal, round and reactive to light Ear-nose-throat: Oropharynx clear, dentition fair Lymphatic: No cervical or supraclavicular adenopathy Lungs no rales or rhonchi, good excursion bilaterally Heart regular rate and rhythm, no murmur appreciated Abd soft, nontender, positive bowel sounds MSK no focal spinal tenderness, no joint edema Neuro: non-focal, well-oriented, appropriate affect  LAB RESULTS: CMP     Component Value Date/Time   NA 136 06/10/2014 1442   NA 140 03/31/2014 1449   NA 142 10/01/2013 1256   K 4.2 06/10/2014 1442   K 4.0 03/31/2014 1449   K 4.5 10/01/2013 1256   CL 103 06/10/2014 1442   CL 108 03/31/2014 1449   CO2 24 06/10/2014 1442   CO2 23 03/31/2014 1449   CO2 27 10/01/2013 1256   GLUCOSE 88 06/10/2014 1442   GLUCOSE 97 03/31/2014 1449   GLUCOSE 127 10/01/2013 1256   BUN 21 06/10/2014 1442   BUN 17 03/31/2014 1449   BUN 18.0 10/01/2013 1256   CREATININE 1.9* 06/10/2014 1442   CREATININE 1.40* 03/31/2014 1449   CREATININE 1.3 10/01/2013 1256   CALCIUM 8.8 06/10/2014 1442   CALCIUM 8.5 03/31/2014 1449   CALCIUM 9.0 10/01/2013 1256   PROT 6.3* 06/10/2014 1442   PROT 6.1 03/31/2014 1449   PROT 6.5 10/01/2013 1256   ALBUMIN 3.9 03/31/2014 1449   ALBUMIN 3.4* 10/01/2013 1256   AST 20 06/10/2014 1442   AST 11 03/31/2014 1449   AST 17 10/01/2013 1256   ALT 24 06/10/2014 1442   ALT 13 03/31/2014 1449   ALT 27 10/01/2013 1256   ALKPHOS 39 06/10/2014 1442   ALKPHOS 28* 03/31/2014 1449   ALKPHOS 39* 10/01/2013 1256   BILITOT 0.60 06/10/2014 1442   BILITOT 0.7 03/31/2014 1449   BILITOT 0.58 10/01/2013 1256   GFRNONAA 48* 09/14/2013 0410   GFRAA 55* 09/14/2013 0410   INo results found for: SPEP, UPEP Lab Results  Component Value Date   WBC 6.3 07/07/2014    NEUTROABS 4.6 07/07/2014   HGB 6.3* 07/07/2014   HCT 20.6* 07/07/2014   MCV 92 07/07/2014   PLT 373 07/07/2014   No results found for: LABCA2 No components found for: [id number] No results for input(s): INR in the last 168 hours. Urinalysis No results found for: COLORURINE, APPEARANCEUR, LABSPEC, PHURINE, GLUCOSEU, HGBUR, BILIRUBINUR, KETONESUR, PROTEINUR, UROBILINOGEN, NITRITE, LEUKOCYTESUR STUDIES: No results found.  ASSESSMENT/PLAN: Brian Bowen is 63 year old gentleman with myelofibrosis. He is JAK2 positive. He is on JAKAFI and low dose prednisone. He recently had issues with his Hgb dropping to 4.7.  He received blood and has increased back up to 6.3. He is feeling much better and no longer has dizziness.  He will continue with his JAKAFI, prednisone and danzol.  We will see him back in 1 months for labs and follow-up.  He knows to call here with any questions or concerns and to go to the ED in the event of an emergency. We can certainly see him sooner if need be.   Eliezer Bottom, NP 07/07/2014 4:26 PM

## 2014-07-08 ENCOUNTER — Encounter (HOSPITAL_COMMUNITY): Payer: Self-pay | Admitting: Internal Medicine

## 2014-07-08 LAB — HOLD TUBE, BLOOD BANK - CHCC SATELLITE

## 2014-07-08 LAB — IRON AND TIBC CHCC
%SAT: 62 % — ABNORMAL HIGH (ref 20–55)
Iron: 172 ug/dL — ABNORMAL HIGH (ref 42–163)
TIBC: 279 ug/dL (ref 202–409)
UIBC: 107 ug/dL — ABNORMAL LOW (ref 117–376)

## 2014-07-08 LAB — FERRITIN CHCC: Ferritin: 3423 ng/ml — ABNORMAL HIGH (ref 22–316)

## 2014-07-28 ENCOUNTER — Other Ambulatory Visit: Payer: Self-pay | Admitting: *Deleted

## 2014-07-28 DIAGNOSIS — D474 Osteomyelofibrosis: Secondary | ICD-10-CM

## 2014-07-28 MED ORDER — RUXOLITINIB PHOSPHATE 5 MG PO TABS: 5.0000 mg | ORAL_TABLET | Freq: Two times a day (BID) | ORAL | Status: DC

## 2014-08-02 ENCOUNTER — Telehealth: Payer: Self-pay | Admitting: Hematology & Oncology

## 2014-08-02 NOTE — Telephone Encounter (Signed)
Pt moved 1-6 to 1-14 he is sick

## 2014-08-04 ENCOUNTER — Ambulatory Visit: Payer: BC Managed Care – PPO | Admitting: Hematology & Oncology

## 2014-08-04 ENCOUNTER — Other Ambulatory Visit: Payer: BC Managed Care – PPO | Admitting: Lab

## 2014-08-11 ENCOUNTER — Other Ambulatory Visit: Payer: Self-pay | Admitting: *Deleted

## 2014-08-11 DIAGNOSIS — D474 Osteomyelofibrosis: Secondary | ICD-10-CM

## 2014-08-12 ENCOUNTER — Other Ambulatory Visit (HOSPITAL_BASED_OUTPATIENT_CLINIC_OR_DEPARTMENT_OTHER): Payer: BLUE CROSS/BLUE SHIELD | Admitting: Lab

## 2014-08-12 ENCOUNTER — Ambulatory Visit (HOSPITAL_BASED_OUTPATIENT_CLINIC_OR_DEPARTMENT_OTHER): Payer: BLUE CROSS/BLUE SHIELD | Admitting: Hematology & Oncology

## 2014-08-12 ENCOUNTER — Encounter: Payer: Self-pay | Admitting: Hematology & Oncology

## 2014-08-12 VITALS — BP 132/79 | HR 96 | Temp 98.3°F | Resp 18 | Wt 168.0 lb

## 2014-08-12 DIAGNOSIS — D474 Osteomyelofibrosis: Secondary | ICD-10-CM

## 2014-08-12 DIAGNOSIS — D7581 Myelofibrosis: Secondary | ICD-10-CM

## 2014-08-12 DIAGNOSIS — D638 Anemia in other chronic diseases classified elsewhere: Secondary | ICD-10-CM

## 2014-08-12 LAB — RETICULOCYTES (CHCC)
ABS RETIC: 40.3 10*3/uL (ref 19.0–186.0)
RBC.: 2.52 MIL/uL — AB (ref 4.22–5.81)
Retic Ct Pct: 1.6 % (ref 0.4–2.3)

## 2014-08-12 LAB — CMP (CANCER CENTER ONLY)
ALT(SGPT): 30 U/L (ref 10–47)
AST: 20 U/L (ref 11–38)
Albumin: 2.8 g/dL — ABNORMAL LOW (ref 3.3–5.5)
Alkaline Phosphatase: 40 U/L (ref 26–84)
BUN, Bld: 31 mg/dL — ABNORMAL HIGH (ref 7–22)
CALCIUM: 10.3 mg/dL (ref 8.0–10.3)
CO2: 27 mEq/L (ref 18–33)
CREATININE: 2 mg/dL — AB (ref 0.6–1.2)
Chloride: 96 mEq/L — ABNORMAL LOW (ref 98–108)
GLUCOSE: 118 mg/dL (ref 73–118)
POTASSIUM: 4.2 meq/L (ref 3.3–4.7)
Sodium: 134 mEq/L (ref 128–145)
TOTAL PROTEIN: 6.5 g/dL (ref 6.4–8.1)
Total Bilirubin: 0.7 mg/dl (ref 0.20–1.60)

## 2014-08-12 LAB — CBC WITH DIFFERENTIAL (CANCER CENTER ONLY)
HEMATOCRIT: 20.8 % — AB (ref 38.7–49.9)
HEMOGLOBIN: 6.5 g/dL — AB (ref 13.0–17.1)
MCH: 27.9 pg — ABNORMAL LOW (ref 28.0–33.4)
MCHC: 31.3 g/dL — ABNORMAL LOW (ref 32.0–35.9)
MCV: 89 fL (ref 82–98)
Platelets: 490 10*3/uL — ABNORMAL HIGH (ref 145–400)
RBC: 2.33 10*6/uL — ABNORMAL LOW (ref 4.20–5.70)
RDW: 27 % — AB (ref 11.1–15.7)
WBC: 19.6 10*3/uL — ABNORMAL HIGH (ref 4.0–10.0)

## 2014-08-12 LAB — MANUAL DIFFERENTIAL (CHCC SATELLITE)
ALC: 1.2 10*3/uL (ref 0.9–3.3)
ANC (CHCC HP manual diff): 16.7 10*3/uL — ABNORMAL HIGH (ref 1.5–6.5)
LYMPH: 6 % — AB (ref 14–48)
MONO: 9 % (ref 0–13)
Myelocytes: 2 % — ABNORMAL HIGH (ref 0–0)
PLT EST ~~LOC~~: INCREASED
SEG: 83 % — ABNORMAL HIGH (ref 40–75)
nRBC: 1 % — ABNORMAL HIGH (ref 0–0)

## 2014-08-12 LAB — LACTATE DEHYDROGENASE: LDH: 229 U/L (ref 94–250)

## 2014-08-12 LAB — HOLD TUBE, BLOOD BANK - CHCC SATELLITE

## 2014-08-12 LAB — CHCC SATELLITE - SMEAR

## 2014-08-12 MED ORDER — DANAZOL 200 MG PO CAPS: ORAL_CAPSULE | ORAL | Status: DC

## 2014-08-12 NOTE — Progress Notes (Signed)
Hematology and Oncology Follow Up Visit  Brian Bowen [id number] 1951-07-14 64 y.o. 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  #2 prednisone 2.5 mg by mouth daily  #3 Jakafi 5mg  po bid     Interim History:  [name] is back for followup. Since we last saw him, he now has bronchitis. He got this probably about 10 days ago. It happened right after New Year's.  He just completed some Levaquin. I don't think he needs any further Levaquin. He says that he is breathing better.  He will be going down to [facility] the end of January. He goes down yearly with his wife. He thinks. I will need to be transfused.  He's not have any sweats. The JAKAFI definitely makes a big difference for him. He controls his splenomegaly and systemic symptoms.  His atrial flutter has not acted up. Thankfully, the bronchitis did not trigger the flutter.  He still working. 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  He's had no bleeding. He's had no nausea or vomiting.  Overall, his performance status is ECOG 1.   Medications:  Current outpatient prescriptions:  .  levalbuterol (XOPENEX HFA) 45 MCG/ACT inhaler, Inhale 2 puffs into the lungs every 6 (six) hours as needed for wheezing., Disp: , Rfl:  .  levofloxacin (LEVAQUIN) 500 MG tablet, Take 500 mg by mouth daily., Disp: , Rfl:  .  danazol (DANOCRINE) 200 MG capsule, TAKE ONE CAPSULE BY MOUTH 3 TIMES A DAY, Disp: 90 capsule, Rfl: 3 .  metolazone (ZAROXOLYN) 2.5 MG tablet, Take 1 tablet (2.5 mg total) by mouth as needed., Disp: 30 tablet, Rfl: 4 .  omeprazole (PRILOSEC) 20 MG capsule, Take 20 mg by mouth every morning. , Disp: , Rfl:  .  predniSONE (DELTASONE) 10 MG tablet, Take 2.5 mg by mouth daily with breakfast., Disp: ,  Rfl:  .  pseudoephedrine (SUDAFED) 120 MG 12 hr tablet, Take 120 mg by mouth every 12 (twelve) hours as needed for congestion., Disp: , Rfl:  .  ruxolitinib phosphate (JAKAFI) 5 MG tablet, Take 1 tablet (5 mg total) by mouth 2 (two) times daily. Total of 10 mg., Disp: 60 tablet, Rfl: 0 .  ULORIC 40 MG tablet, TAKE 1 TABLET BY MOUTH EVERY DAY, Disp: 30 tablet, Rfl: 4  Allergies: No Known Allergies  Past Medical History, Surgical history, Social history, and Family History were reviewed and updated.  Review of Systems: As above  Physical Exam:  weight is 168 lb (76.204 kg). His oral temperature is 98.3 F (36.8 C). His blood pressure is 132/79 and his pulse is 96. His respiration is 18.   Well-developed and well-nourished white cell and. Head and neck exam shows no ocular or oral lesions. He has no palpable cervical or supraclavicular lymph nodes. Lungs are clear. Cardiac exam irregular rate and rhythm consistent with fibrillation. He has a 1/6 systolic ejection murmur. Abdomen soft. Has good bowel sounds. There is no fluid wave. There is no palpable abdominal mass. There is no palpable liver tip. His spleen tip might be just below the left costal margin. Back exam no tenderness over the spine ribs or hips. Extremities shows no clubbing or cyanosis. He has 1-2+ edema in his lower legs. Skin exam shows no rashes,  ecchymosis or petechia. Neurological exam shows no focal neurological deficits.  Lab Results  Component Value Date   WBC 19.6* 08/12/2014   HGB 6.5* 08/12/2014   HCT 20.8* 08/12/2014   MCV 89 08/12/2014   PLT 490* 08/12/2014     Chemistry      Component Value Date/Time   NA 134 08/12/2014 1311   NA 138 07/07/2014 1444   NA 142 10/01/2013 1256   K 4.2 08/12/2014 1311   K 4.1 07/07/2014 1444   K 4.5 10/01/2013 1256   CL 96* 08/12/2014 1311   CL 104 07/07/2014 1444   CO2 27 08/12/2014 1311   CO2 23 07/07/2014 1444   CO2 27 10/01/2013 1256   BUN 31* 08/12/2014 1311   BUN  26* 07/07/2014 1444   BUN 18.0 10/01/2013 1256   CREATININE 2.0* 08/12/2014 1311   CREATININE 1.84* 07/07/2014 1444   CREATININE 1.3 10/01/2013 1256      Component Value Date/Time   CALCIUM 10.3 08/12/2014 1311   CALCIUM 8.7 07/07/2014 1444   CALCIUM 9.0 10/01/2013 1256   ALKPHOS 40 08/12/2014 1311   ALKPHOS 44 07/07/2014 1444   ALKPHOS 39* 10/01/2013 1256   AST 20 08/12/2014 1311   AST 14 07/07/2014 1444   AST 17 10/01/2013 1256   ALT 30 08/12/2014 1311   ALT 16 07/07/2014 1444   ALT 27 10/01/2013 1256   BILITOT 0.70 08/12/2014 1311   BILITOT 0.6 07/07/2014 1444   BILITOT 0.58 10/01/2013 1256         Impression and Plan: Brian Bowen is 64 year old gentleman with myelofibrosis. Again, he is JAK2  positive. He's on JAKAFI which helps control his systemic symptoms incredibly well.  He is anemic but holding on pretty well.  The problem is that he is going on vacation in late January. He goes down to [facility].  We will transfuse him before he goes down. This, I think, will definitely make him feel well so we can enjoy his vacation.  I think that the bronchitis that he has probably is causing some of the issues for him.  He will go on to 2.5 mg of prednisone daily.      Volanda Napoleon, MD 1/14/20162:35 PM

## 2014-08-13 LAB — IRON AND TIBC CHCC
%SAT: 94 % — ABNORMAL HIGH (ref 20–55)
Iron: 291 ug/dL — ABNORMAL HIGH (ref 42–163)
TIBC: 311 ug/dL (ref 202–409)
UIBC: 20 ug/dL — AB (ref 117–376)

## 2014-08-13 LAB — FERRITIN CHCC: Ferritin: 3823 ng/ml — ABNORMAL HIGH (ref 22–316)

## 2014-08-17 ENCOUNTER — Ambulatory Visit (HOSPITAL_COMMUNITY)
Admission: RE | Admit: 2014-08-17 | Discharge: 2014-08-17 | Disposition: A | Discharge status: Home / Self Care | Payer: BLUE CROSS/BLUE SHIELD | Source: Ambulatory Visit | Attending: Hematology & Oncology | Admitting: Hematology & Oncology

## 2014-08-17 DIAGNOSIS — D7581 Myelofibrosis: Secondary | ICD-10-CM

## 2014-08-17 DIAGNOSIS — D474 Osteomyelofibrosis: Secondary | ICD-10-CM | POA: Insufficient documentation

## 2014-08-19 ENCOUNTER — Other Ambulatory Visit: Payer: Self-pay | Admitting: *Deleted

## 2014-08-19 DIAGNOSIS — D474 Osteomyelofibrosis: Secondary | ICD-10-CM

## 2014-08-19 MED ORDER — RUXOLITINIB PHOSPHATE 5 MG PO TABS: 5.0000 mg | ORAL_TABLET | Freq: Two times a day (BID) | ORAL | Status: DC

## 2014-08-23 ENCOUNTER — Ambulatory Visit (HOSPITAL_BASED_OUTPATIENT_CLINIC_OR_DEPARTMENT_OTHER): Payer: BLUE CROSS/BLUE SHIELD | Admitting: Lab

## 2014-08-23 ENCOUNTER — Ambulatory Visit (HOSPITAL_BASED_OUTPATIENT_CLINIC_OR_DEPARTMENT_OTHER): Payer: BLUE CROSS/BLUE SHIELD

## 2014-08-23 ENCOUNTER — Other Ambulatory Visit: Payer: Self-pay | Admitting: *Deleted

## 2014-08-23 VITALS — BP 152/78 | HR 68 | Temp 98.2°F | Resp 20

## 2014-08-23 DIAGNOSIS — D474 Osteomyelofibrosis: Secondary | ICD-10-CM

## 2014-08-23 DIAGNOSIS — D7581 Myelofibrosis: Secondary | ICD-10-CM

## 2014-08-23 LAB — PREPARE RBC (CROSSMATCH)

## 2014-08-23 LAB — CBC WITH DIFFERENTIAL (CANCER CENTER ONLY)
BASO#: 0.2 10*3/uL (ref 0.0–0.2)
BASO%: 2.2 % — AB (ref 0.0–2.0)
EOS%: 0.1 % (ref 0.0–7.0)
Eosinophils Absolute: 0 10*3/uL (ref 0.0–0.5)
HEMATOCRIT: 17.4 % — AB (ref 38.7–49.9)
HGB: 5.2 g/dL — CL (ref 13.0–17.1)
LYMPH#: 0.6 10*3/uL — AB (ref 0.9–3.3)
LYMPH%: 6.2 % — ABNORMAL LOW (ref 14.0–48.0)
MCH: 28.3 pg (ref 28.0–33.4)
MCHC: 29.9 g/dL — ABNORMAL LOW (ref 32.0–35.9)
MCV: 95 fL (ref 82–98)
MONO#: 1.2 10*3/uL — ABNORMAL HIGH (ref 0.1–0.9)
MONO%: 12.6 % (ref 0.0–13.0)
NEUT#: 7.5 10*3/uL — ABNORMAL HIGH (ref 1.5–6.5)
NEUT%: 78.9 % (ref 40.0–80.0)
RBC: 1.84 10*6/uL — ABNORMAL LOW (ref 4.20–5.70)
RDW: 28.1 % — ABNORMAL HIGH (ref 11.1–15.7)
WBC: 9.5 10*3/uL (ref 4.0–10.0)

## 2014-08-23 LAB — HOLD TUBE, BLOOD BANK - CHCC SATELLITE

## 2014-08-23 LAB — TECHNOLOGIST REVIEW CHCC SATELLITE

## 2014-08-23 MED ORDER — SODIUM CHLORIDE 0.9 % IV SOLN
250.0000 mL | Freq: Once | INTRAVENOUS | Status: AC
  Administered 2014-08-23: 250 mL via INTRAVENOUS

## 2014-08-23 MED ORDER — DIPHENHYDRAMINE HCL 25 MG PO CAPS
ORAL_CAPSULE | ORAL | Status: AC
  Filled 2014-08-23: qty 1

## 2014-08-23 MED ORDER — ACETAMINOPHEN 325 MG PO TABS
650.0000 mg | ORAL_TABLET | Freq: Once | ORAL | Status: AC
  Administered 2014-08-23: 650 mg via ORAL

## 2014-08-23 MED ORDER — FUROSEMIDE 10 MG/ML IJ SOLN
INTRAMUSCULAR | Status: AC
  Filled 2014-08-23: qty 4

## 2014-08-23 MED ORDER — DIPHENHYDRAMINE HCL 25 MG PO CAPS
25.0000 mg | ORAL_CAPSULE | Freq: Once | ORAL | Status: AC
  Administered 2014-08-23: 25 mg via ORAL

## 2014-08-23 MED ORDER — FUROSEMIDE 10 MG/ML IJ SOLN
20.0000 mg | Freq: Once | INTRAMUSCULAR | Status: AC
  Administered 2014-08-23: 20 mg via INTRAVENOUS

## 2014-08-23 MED ORDER — ACETAMINOPHEN 325 MG PO TABS
ORAL_TABLET | ORAL | Status: AC
  Filled 2014-08-23: qty 2

## 2014-08-23 NOTE — Patient Instructions (Signed)

## 2014-08-24 LAB — TYPE AND SCREEN
ABO/RH(D): O POS
Antibody Screen: NEGATIVE
UNIT DIVISION: 0
Unit division: 0

## 2014-08-27 ENCOUNTER — Encounter: Payer: Self-pay | Admitting: Hematology & Oncology

## 2014-09-17 ENCOUNTER — Telehealth: Payer: Self-pay | Admitting: Hematology & Oncology

## 2014-09-17 NOTE — Telephone Encounter (Signed)
Pt moved 2-25 to 3-4

## 2014-09-23 ENCOUNTER — Ambulatory Visit: Payer: BLUE CROSS/BLUE SHIELD | Admitting: Hematology & Oncology

## 2014-09-23 ENCOUNTER — Other Ambulatory Visit: Payer: BLUE CROSS/BLUE SHIELD | Admitting: Lab

## 2014-09-30 ENCOUNTER — Other Ambulatory Visit: Payer: Self-pay | Admitting: *Deleted

## 2014-09-30 DIAGNOSIS — D474 Osteomyelofibrosis: Secondary | ICD-10-CM

## 2014-10-01 ENCOUNTER — Other Ambulatory Visit (HOSPITAL_BASED_OUTPATIENT_CLINIC_OR_DEPARTMENT_OTHER): Payer: BLUE CROSS/BLUE SHIELD | Admitting: Lab

## 2014-10-01 ENCOUNTER — Encounter: Payer: Self-pay | Admitting: Nurse Practitioner

## 2014-10-01 ENCOUNTER — Ambulatory Visit (HOSPITAL_BASED_OUTPATIENT_CLINIC_OR_DEPARTMENT_OTHER): Payer: BLUE CROSS/BLUE SHIELD | Admitting: Family

## 2014-10-01 ENCOUNTER — Encounter: Payer: Self-pay | Admitting: Hematology & Oncology

## 2014-10-01 VITALS — BP 156/64 | HR 85 | Temp 98.6°F | Resp 18 | Ht 70.0 in | Wt 178.0 lb

## 2014-10-01 DIAGNOSIS — D474 Osteomyelofibrosis: Secondary | ICD-10-CM

## 2014-10-01 DIAGNOSIS — D7581 Myelofibrosis: Secondary | ICD-10-CM

## 2014-10-01 DIAGNOSIS — I4892 Unspecified atrial flutter: Secondary | ICD-10-CM

## 2014-10-01 DIAGNOSIS — D630 Anemia in neoplastic disease: Secondary | ICD-10-CM

## 2014-10-01 DIAGNOSIS — D731 Hypersplenism: Secondary | ICD-10-CM

## 2014-10-01 LAB — HOLD TUBE, BLOOD BANK - CHCC SATELLITE

## 2014-10-01 LAB — CBC WITH DIFFERENTIAL (CANCER CENTER ONLY)
BASO#: 0.1 10*3/uL (ref 0.0–0.2)
BASO%: 1.3 % (ref 0.0–2.0)
EOS ABS: 0 10*3/uL (ref 0.0–0.5)
EOS%: 0 % (ref 0.0–7.0)
HCT: 19.9 % — ABNORMAL LOW (ref 38.7–49.9)
HEMOGLOBIN: 6 g/dL — AB (ref 13.0–17.1)
LYMPH#: 0.7 10*3/uL — ABNORMAL LOW (ref 0.9–3.3)
LYMPH%: 11.3 % — ABNORMAL LOW (ref 14.0–48.0)
MCH: 28.6 pg (ref 28.0–33.4)
MCHC: 30.2 g/dL — ABNORMAL LOW (ref 32.0–35.9)
MCV: 95 fL (ref 82–98)
MONO#: 0.6 10*3/uL (ref 0.1–0.9)
MONO%: 10.4 % (ref 0.0–13.0)
NEUT%: 77 % (ref 40.0–80.0)
NEUTROS ABS: 4.7 10*3/uL (ref 1.5–6.5)
PLATELETS: 335 10*3/uL (ref 145–400)
RBC: 2.1 10*6/uL — AB (ref 4.20–5.70)
RDW: 25 % — ABNORMAL HIGH (ref 11.1–15.7)
WBC: 6.1 10*3/uL (ref 4.0–10.0)

## 2014-10-01 LAB — COMPREHENSIVE METABOLIC PANEL
ALK PHOS: 38 U/L — AB (ref 39–117)
ALT: 18 U/L (ref 0–53)
AST: 14 U/L (ref 0–37)
Albumin: 4.2 g/dL (ref 3.5–5.2)
BILIRUBIN TOTAL: 0.6 mg/dL (ref 0.2–1.2)
BUN: 23 mg/dL (ref 6–23)
CO2: 24 mEq/L (ref 19–32)
Calcium: 8.7 mg/dL (ref 8.4–10.5)
Chloride: 104 mEq/L (ref 96–112)
Creatinine, Ser: 1.63 mg/dL — ABNORMAL HIGH (ref 0.50–1.35)
GLUCOSE: 92 mg/dL (ref 70–99)
Potassium: 4.4 mEq/L (ref 3.5–5.3)
Sodium: 138 mEq/L (ref 135–145)
Total Protein: 6.6 g/dL (ref 6.0–8.3)

## 2014-10-01 LAB — CHCC SATELLITE - SMEAR

## 2014-10-01 LAB — RETICULOCYTES (CHCC)
ABS Retic: 35 10*3/uL (ref 19.0–186.0)
RBC.: 2.19 MIL/uL — ABNORMAL LOW (ref 4.22–5.81)
Retic Ct Pct: 1.6 % (ref 0.4–2.3)

## 2014-10-01 LAB — LACTATE DEHYDROGENASE: LDH: 290 U/L — ABNORMAL HIGH (ref 94–250)

## 2014-10-01 LAB — TECHNOLOGIST REVIEW CHCC SATELLITE

## 2014-10-01 NOTE — Progress Notes (Signed)
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 He is feeling good and had a wonderful time in [place] with his family. He was able to find some new art pieces for their home. He did get a little tired after walking a lot while there.  He denies fever, chills, n/v, cough, rash, headache, dizziness, SOB, chest pain, palpitations, abdominal pain, constipation, blood in urine or stool. No episodes of bleeding.  He has had a runny nose and is now taking Allegra. This seams to be helping some.  No swelling, tenderness, numbness or tingling in his extremities. No new aches or pains.  His appetite is good and he is staying hydrated. His weight is stable at 178 lbs.   He last received blood in January. He will usually tell us when he feels that he needs transfused.  He has had no problem with his atrial fibrillation/flutter. He is not on any anticoagulation for this.  CURRENT TREATMENT: 1. Danazol 600 mg by mouth daily. 2. Prednisone 2.5 mg by mouth daily 3. Jakafi 5mg  po bid  REVIEW OF SYSTEMS: All other 10 point review of systems is negative.   PAST MEDICAL HISTORY: Past Medical History  Diagnosis Date  . 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 Atrial flutter     s/p CTI ablation by Dr Lovena Le 08/2013    PAST SURGICAL HISTORY: Past Surgical History  Procedure Laterality Date  . Tonsillectomy      age 61 years old  . Back surgery      #6 upper back, nerve cut  . Left arm      broken-plate from elbow down  . Tee  without cardioversion N/A 09/14/2013    Procedure: TRANSESOPHAGEAL ECHOCARDIOGRAM (TEE);  Surgeon: Dorothy Spark, MD;  Location: [place];  Service: Cardiovascular;  Laterality: N/A;  . Ablation  09/14/2013    CTI ablation by Dr Lovena Le  . Atrial flutter ablation N/A 09/14/2013    Procedure: ATRIAL FLUTTER ABLATION;  Surgeon: Evans Lance, MD;  Location[facility] C[facility] LAB;  Service: Cardiovascular;  Laterality: N/A;    FAMILY HISTORY No family history on file.  GYNECOLOGIC HISTORY:  No LMP for male patient.   SOCIAL HISTORY: History   Social History  . Marital Status: Married    Spouse Name: N/A  . Number of Children: N/A  . Years of Education: N/A   Occupational History  . Not on file.   Social History Main Topics  . Smoking status: Former Smoker -- 0.25 packs/day for 13 years    Types: Cigarettes    Start date: 04/21/1971    Quit date: 01/19/1984  . Smokeless tobacco: Never Used     Comment: quit 30 years ago  . Alcohol Use: 0.0 oz/week    0 Standard drinks or equivalent per week  . Drug Use: No  . Sexual Activity: Not on file   Other Topics Concern  . Not on file   Social History Narrative    ADVANCED DIRECTIVES:  <no information>  HEALTH MAINTENANCE: History  Substance Use Topics  . Smoking status: Former Smoker -- 0.25 packs/day for 13  years    Types: Cigarettes    Start date: 04/21/1971    Quit date: 01/19/1984  . Smokeless tobacco: Never Used     Comment: quit 30 years ago  . Alcohol Use: 0.0 oz/week    0 Standard drinks or equivalent per week   Colonoscopy: PAP: Bone density: Lipid panel:  No Known Allergies  Current Outpatient Prescriptions  Medication Sig Dispense Refill  . danazol (DANOCRINE) 200 MG capsule TAKE ONE CAPSULE BY MOUTH 3 TIMES A DAY 90 capsule 3  . fexofenadine (ALLEGRA) 30 MG tablet Take 30 mg by mouth daily.    Marland Kitchen levalbuterol (XOPENEX HFA) 45 MCG/ACT inhaler Inhale 2 puffs into the lungs every 6 (six) hours as needed for  wheezing.    . metolazone (ZAROXOLYN) 2.5 MG tablet Take 1 tablet (2.5 mg total) by mouth as needed. 30 tablet 4  . omeprazole (PRILOSEC) 20 MG capsule Take 20 mg by mouth every morning.     . predniSONE (DELTASONE) 10 MG tablet Take 2.5 mg by mouth daily with breakfast.    . ruxolitinib phosphate (JAKAFI) 5 MG tablet Take 1 tablet (5 mg total) by mouth 2 (two) times daily. Total of 10 mg. 60 tablet 0  . ULORIC 40 MG tablet TAKE 1 TABLET BY MOUTH EVERY DAY 30 tablet 4   No current facility-administered medications for this visit.    OBJECTIVE: Filed Vitals:   10/01/14 1241  BP: 156/64  Pulse: 85  Temp: 98.6 F (37 C)  Resp: 18    Filed Weights   10/01/14 1241  Weight: 178 lb (80.74 kg)   ECOG FS:0 - Asymptomatic Ocular: Sclerae unicteric, pupils equal, round and reactive to light Ear-nose-throat: Oropharynx clear, dentition fair Lymphatic: No cervical or supraclavicular adenopathy Lungs no rales or rhonchi, good excursion bilaterally Heart regular rate and rhythm, no murmur appreciated Abd soft, nontender, positive bowel sounds MSK no focal spinal tenderness, no joint edema Neuro: non-focal, well-oriented, appropriate affect  LAB RESULTS: CMP     Component Value Date/Time   NA 134 08/12/2014 1311   NA 138 07/07/2014 1444   NA 142 10/01/2013 1256   K 4.2 08/12/2014 1311   K 4.1 07/07/2014 1444   K 4.5 10/01/2013 1256   CL 96* 08/12/2014 1311   CL 104 07/07/2014 1444   CO2 27 08/12/2014 1311   CO2 23 07/07/2014 1444   CO2 27 10/01/2013 1256   GLUCOSE 118 08/12/2014 1311   GLUCOSE 83 07/07/2014 1444   GLUCOSE 127 10/01/2013 1256   BUN 31* 08/12/2014 1311   BUN 26* 07/07/2014 1444   BUN 18.0 10/01/2013 1256   CREATININE 2.0* 08/12/2014 1311   CREATININE 1.84* 07/07/2014 1444   CREATININE 1.3 10/01/2013 1256   CALCIUM 10.3 08/12/2014 1311   CALCIUM 8.7 07/07/2014 1444   CALCIUM 9.0 10/01/2013 1256   PROT 6.5 08/12/2014 1311   PROT 6.4 07/07/2014 1444   PROT 6.5  10/01/2013 1256   ALBUMIN 3.9 07/07/2014 1444   ALBUMIN 3.4* 10/01/2013 1256   AST 20 08/12/2014 1311   AST 14 07/07/2014 1444   AST 17 10/01/2013 1256   ALT 30 08/12/2014 1311   ALT 16 07/07/2014 1444   ALT 27 10/01/2013 1256   ALKPHOS 40 08/12/2014 1311   ALKPHOS 44 07/07/2014 1444   ALKPHOS 39* 10/01/2013 1256   BILITOT 0.70 08/12/2014 1311   BILITOT 0.6 07/07/2014 1444   BILITOT 0.58 10/01/2013 1256   GFRNONAA 48* 09/14/2013 0410   GFRAA 55* 09/14/2013  0410   INo results found for: SPEP, UPEP Lab Results  Component Value Date   WBC 6.1 10/01/2014   NEUTROABS 4.7 10/01/2014   HGB 6.0* 10/01/2014   HCT 19.9* 10/01/2014   MCV 95 10/01/2014   PLT 335 10/01/2014   No results found for: LABCA2 No components found for: QIONG295 No results for input(s): INR in the last 168 hours. Urinalysis No results found for: COLORURINE, APPEARANCEUR, LABSPEC, PHURINE, GLUCOSEU, HGBUR, BILIRUBINUR, KETONESUR, PROTEINUR, UROBILINOGEN, NITRITE, LEUKOCYTESUR STUDIES: No results found.  ASSESSMENT/PLAN: [name] is 64 year old gentleman with myelofibrosis. He is JAK2 positive. He is on JAKAFI and low dose prednisone. He is feeling much better and is asymptomatic at this time.  His Hgb is 6.0, MCV 95 and platelets are 335. He does not feel like he needs transfused at this time.  He will continue with his JAKAFI, prednisone and danzol.  We will see him back in 1 months for labs and follow-up.  He knows to call here with any questions or concerns and to go to the ED in the event of an emergency. We can certainly see him sooner if need be.   Eliezer Bottom, NP 10/01/2014 12:51 PM

## 2014-10-04 LAB — IRON AND TIBC CHCC
%SAT: 49 % (ref 20–55)
IRON: 147 ug/dL (ref 42–163)
TIBC: 301 ug/dL (ref 202–409)
UIBC: 154 ug/dL (ref 117–376)

## 2014-10-04 LAB — FERRITIN CHCC: Ferritin: 5001 ng/ml — ABNORMAL HIGH (ref 22–316)

## 2014-10-27 ENCOUNTER — Ambulatory Visit (HOSPITAL_COMMUNITY)
Admission: RE | Admit: 2014-10-27 | Discharge: 2014-10-27 | Disposition: A | Discharge status: Home / Self Care | Payer: BLUE CROSS/BLUE SHIELD | Source: Ambulatory Visit | Attending: Hematology & Oncology | Admitting: Hematology & Oncology

## 2014-10-27 ENCOUNTER — Encounter: Payer: Self-pay | Admitting: Hematology & Oncology

## 2014-10-27 ENCOUNTER — Ambulatory Visit (HOSPITAL_BASED_OUTPATIENT_CLINIC_OR_DEPARTMENT_OTHER): Payer: BLUE CROSS/BLUE SHIELD | Admitting: Hematology & Oncology

## 2014-10-27 ENCOUNTER — Other Ambulatory Visit (HOSPITAL_BASED_OUTPATIENT_CLINIC_OR_DEPARTMENT_OTHER): Payer: BLUE CROSS/BLUE SHIELD

## 2014-10-27 VITALS — BP 137/62 | HR 82 | Temp 98.4°F | Resp 18 | Ht 70.0 in | Wt 178.0 lb

## 2014-10-27 DIAGNOSIS — D7581 Myelofibrosis: Secondary | ICD-10-CM

## 2014-10-27 DIAGNOSIS — D474 Osteomyelofibrosis: Secondary | ICD-10-CM

## 2014-10-27 DIAGNOSIS — D649 Anemia, unspecified: Secondary | ICD-10-CM | POA: Diagnosis not present

## 2014-10-27 LAB — RETICULOCYTES (CHCC)
ABS Retic: 27.5 10*3/uL (ref 19.0–186.0)
RBC.: 1.83 MIL/uL — ABNORMAL LOW (ref 4.22–5.81)
Retic Ct Pct: 1.5 % (ref 0.4–2.3)

## 2014-10-27 LAB — CBC WITH DIFFERENTIAL (CANCER CENTER ONLY)
BASO#: 0.1 10*3/uL (ref 0.0–0.2)
BASO%: 2 % (ref 0.0–2.0)
EOS ABS: 0 10*3/uL (ref 0.0–0.5)
EOS%: 0 % (ref 0.0–7.0)
HEMATOCRIT: 16.6 % — AB (ref 38.7–49.9)
HGB: 4.9 g/dL — CL (ref 13.0–17.1)
LYMPH#: 0.7 10*3/uL — AB (ref 0.9–3.3)
LYMPH%: 12.3 % — AB (ref 14.0–48.0)
MCH: 28.7 pg (ref 28.0–33.4)
MCHC: 29.5 g/dL — AB (ref 32.0–35.9)
MCV: 97 fL (ref 82–98)
MONO#: 0.4 10*3/uL (ref 0.1–0.9)
MONO%: 7.5 % (ref 0.0–13.0)
NEUT#: 4.6 10*3/uL (ref 1.5–6.5)
NEUT%: 78.2 % (ref 40.0–80.0)
Platelets: 312 10*3/uL (ref 145–400)
RBC: 1.71 10*6/uL — ABNORMAL LOW (ref 4.20–5.70)
RDW: 24.9 % — AB (ref 11.1–15.7)
WBC: 5.9 10*3/uL (ref 4.0–10.0)

## 2014-10-27 LAB — CMP (CANCER CENTER ONLY)
ALK PHOS: 50 U/L (ref 26–84)
ALT(SGPT): 18 U/L (ref 10–47)
AST: 25 U/L (ref 11–38)
Albumin: 3.2 g/dL — ABNORMAL LOW (ref 3.3–5.5)
BILIRUBIN TOTAL: 1 mg/dL (ref 0.20–1.60)
BUN, Bld: 21 mg/dL (ref 7–22)
CALCIUM: 9.1 mg/dL (ref 8.0–10.3)
CO2: 26 meq/L (ref 18–33)
Chloride: 107 mEq/L (ref 98–108)
Creat: 1.8 mg/dl — ABNORMAL HIGH (ref 0.6–1.2)
GLUCOSE: 123 mg/dL — AB (ref 73–118)
Potassium: 4.7 mEq/L (ref 3.3–4.7)
Sodium: 138 mEq/L (ref 128–145)
TOTAL PROTEIN: 6.9 g/dL (ref 6.4–8.1)

## 2014-10-27 LAB — CHCC SATELLITE - SMEAR

## 2014-10-27 LAB — LACTATE DEHYDROGENASE: LDH: 294 U/L — ABNORMAL HIGH (ref 94–250)

## 2014-10-27 LAB — HOLD TUBE, BLOOD BANK - CHCC SATELLITE

## 2014-10-27 LAB — TECHNOLOGIST REVIEW CHCC SATELLITE

## 2014-10-27 NOTE — Progress Notes (Signed)
Hematology and Oncology Follow Up Visit  AESON SAWYERS [id number] [birth year] 64 y.o. 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  #2 prednisone 2.5 mg by mouth daily  #3 Jakafi 5mg  po bid     Interim History:  Mr.  Latorre is back for followup. He's doing quite well. He had a great time down in [place]. He went down in early February. He had a good time down there. He ate a lot of good seafood. He really did not have a lot of fatigue. There is no fever. He had no bleeding. He had no cardiac issues. There is no atrial flutter recurrence.  We did transfuse him back in late January before he went on his vacation. As such, this is pretty good for him. He has not been transfused for about 2 months.  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  Again, the atrial flutter has not exacerbated. He's had no cough. Had no shortness of breath. He's had no bleeding. He's had no nausea or vomiting area   Overall, his performance status is ECOG 1.   Medications:  Current outpatient prescriptions:  .  danazol (DANOCRINE) 200 MG capsule, TAKE ONE CAPSULE BY MOUTH 3 TIMES A DAY, Disp: 90 capsule, Rfl: 3 .  levalbuterol (XOPENEX HFA) 45 MCG/ACT inhaler, Inhale 2 puffs into the lungs every 6 (six) hours as needed for wheezing., Disp: , Rfl:  .  loratadine (CLARITIN) 10 MG tablet, Take 10 mg by mouth daily., Disp: , Rfl:  .  metolazone (ZAROXOLYN) 2.5 MG tablet, Take 1 tablet (2.5 mg total) by mouth as needed., Disp: 30 tablet, Rfl: 4 .  omeprazole (PRILOSEC) 20 MG capsule, Take 20 mg by mouth every morning. , Disp: , Rfl:  .  predniSONE (DELTASONE) 10 MG tablet, Take 2.5 mg by mouth daily with breakfast., Disp: , Rfl:  .  ruxolitinib phosphate (JAKAFI) 5 MG tablet, Take 1 tablet  (5 mg total) by mouth 2 (two) times daily. Total of 10 mg., Disp: 60 tablet, Rfl: 0 .  ULORIC 40 MG tablet, TAKE 1 TABLET BY MOUTH EVERY DAY, Disp: 30 tablet, Rfl: 4  Allergies: No Known Allergies  Past Medical History, Surgical history, Social history, and Family History were reviewed and updated.  Review of Systems: As above  Physical Exam:  height is 5\' 10"  (1.778 m) and weight is 178 lb (80.74 kg). His oral temperature is 98.4 F (36.9 C). His blood pressure is 137/62 and his pulse is 82. His respiration is 18.   Well-developed and well-nourished white cell and. Head and neck exam shows no ocular or oral lesions. He has no palpable cervical or supraclavicular lymph nodes. Lungs are clear. Cardiac exam regular rate and rhythm a normal S1 and S2.   He has a 1/6 systolic ejection murmur. Abdomen is soft. He has good bowel sounds. There is no fluid wave. There is no palpable abdominal mass. There is no palpable liver tip. His spleen tip is about 7cm below the left costal margin. Back exam shows no tenderness over the spine ribs or hips. Extremities shows no clubbing or cyanosis. He has 1+ edema in his lower legs. Skin exam shows no rashes, ecchymosis or petechia. Neurological exam shows no focal neurological deficits.  Lab Results  Component  Value Date   WBC 5.9 10/27/2014   HGB 4.9* 10/27/2014   HCT 16.6* 10/27/2014   MCV 97 10/27/2014   PLT 312 10/27/2014     Chemistry      Component Value Date/Time   NA 138 10/27/2014 1329   NA 138 10/01/2014 1202   NA 142 10/01/2013 1256   K 4.7 10/27/2014 1329   K 4.4 10/01/2014 1202   K 4.5 10/01/2013 1256   CL 107 10/27/2014 1329   CL 104 10/01/2014 1202   CO2 26 10/27/2014 1329   CO2 24 10/01/2014 1202   CO2 27 10/01/2013 1256   BUN 21 10/27/2014 1329   BUN 23 10/01/2014 1202   BUN 18.0 10/01/2013 1256   CREATININE 1.8* 10/27/2014 1329   CREATININE 1.63* 10/01/2014 1202   CREATININE 1.3 10/01/2013 1256      Component Value  Date/Time   CALCIUM 9.1 10/27/2014 1329   CALCIUM 8.7 10/01/2014 1202   CALCIUM 9.0 10/01/2013 1256   ALKPHOS 50 10/27/2014 1329   ALKPHOS 38* 10/01/2014 1202   ALKPHOS 39* 10/01/2013 1256   AST 25 10/27/2014 1329   AST 14 10/01/2014 1202   AST 17 10/01/2013 1256   ALT 18 10/27/2014 1329   ALT 18 10/01/2014 1202   ALT 27 10/01/2013 1256   BILITOT 1.00 10/27/2014 1329   BILITOT 0.6 10/01/2014 1202   BILITOT 0.58 10/01/2013 1256         Impression and Plan: Mr. Monk is 64 year old gentleman with myelofibrosis. Again, he is JAK2  positive. He's on JAKAFI which helps control his systemic symptoms incredibly well.  I'm so glad that he had a good time on vacation down in [place]. He was down in [place]. He and his wife had a great time.  I am somewhat surprised by the degree of his anemia. I cannot recall him being this anemic. We will have to transfuse him. I will give him 2 units.  His last iron studies done back in early March showed a ferritin of 5000 but a saturation of only 49%. As such, a lot of the ferritin elevation is probably secondary to inflammatory issues.  We will plan to get him back for transfusion this week.  I will get him back in one month to be seen.    Volanda Napoleon, MD 3/30/20163:16 PM

## 2014-10-28 LAB — FERRITIN CHCC: Ferritin: 5915 ng/ml — ABNORMAL HIGH (ref 22–316)

## 2014-10-28 LAB — IRON AND TIBC CHCC
%SAT: 70 % — AB (ref 20–55)
Iron: 195 ug/dL — ABNORMAL HIGH (ref 42–163)
TIBC: 280 ug/dL (ref 202–409)
UIBC: 85 ug/dL — AB (ref 117–376)

## 2014-10-29 ENCOUNTER — Other Ambulatory Visit: Payer: Self-pay | Admitting: Emergency Medicine

## 2014-10-29 ENCOUNTER — Ambulatory Visit (HOSPITAL_BASED_OUTPATIENT_CLINIC_OR_DEPARTMENT_OTHER): Payer: BLUE CROSS/BLUE SHIELD

## 2014-10-29 ENCOUNTER — Ambulatory Visit (HOSPITAL_COMMUNITY)
Admission: RE | Admit: 2014-10-29 | Discharge: 2014-10-29 | Disposition: A | Discharge status: Home / Self Care | Payer: BLUE CROSS/BLUE SHIELD | Source: Ambulatory Visit | Attending: Hematology & Oncology | Admitting: Hematology & Oncology

## 2014-10-29 VITALS — BP 126/56 | HR 69 | Temp 98.4°F | Resp 16

## 2014-10-29 DIAGNOSIS — D649 Anemia, unspecified: Secondary | ICD-10-CM | POA: Diagnosis present

## 2014-10-29 DIAGNOSIS — D7581 Myelofibrosis: Secondary | ICD-10-CM | POA: Diagnosis not present

## 2014-10-29 DIAGNOSIS — D474 Osteomyelofibrosis: Secondary | ICD-10-CM

## 2014-10-29 LAB — PREPARE RBC (CROSSMATCH)

## 2014-10-29 MED ORDER — SODIUM CHLORIDE 0.9 % IV SOLN
250.0000 mL | Freq: Once | INTRAVENOUS | Status: AC
Start: 2014-10-29 — End: 2014-10-29
  Administered 2014-10-29: 250 mL via INTRAVENOUS

## 2014-10-29 MED ORDER — ACETAMINOPHEN 325 MG PO TABS
ORAL_TABLET | ORAL | Status: AC
  Filled 2014-10-29: qty 2

## 2014-10-29 MED ORDER — DIPHENHYDRAMINE HCL 25 MG PO CAPS
25.0000 mg | ORAL_CAPSULE | Freq: Once | ORAL | Status: AC
  Administered 2014-10-29: 25 mg via ORAL

## 2014-10-29 MED ORDER — DIPHENHYDRAMINE HCL 25 MG PO CAPS
ORAL_CAPSULE | ORAL | Status: AC
  Filled 2014-10-29: qty 1

## 2014-10-29 MED ORDER — ACETAMINOPHEN 325 MG PO TABS
650.0000 mg | ORAL_TABLET | Freq: Once | ORAL | Status: AC
  Administered 2014-10-29: 650 mg via ORAL

## 2014-10-29 MED ORDER — FUROSEMIDE 10 MG/ML IJ SOLN
20.0000 mg | Freq: Once | INTRAMUSCULAR | Status: AC
  Administered 2014-10-29: 20 mg via INTRAVENOUS

## 2014-10-29 MED ORDER — FUROSEMIDE 10 MG/ML IJ SOLN
INTRAMUSCULAR | Status: AC
  Filled 2014-10-29: qty 4

## 2014-10-29 NOTE — Patient Instructions (Signed)

## 2014-10-30 LAB — TYPE AND SCREEN
ABO/RH(D): O POS
ANTIBODY SCREEN: NEGATIVE
UNIT DIVISION: 0
Unit division: 0

## 2014-11-01 ENCOUNTER — Encounter: Payer: Self-pay | Admitting: Hematology & Oncology

## 2014-11-05 ENCOUNTER — Other Ambulatory Visit: Payer: BLUE CROSS/BLUE SHIELD

## 2014-11-05 ENCOUNTER — Other Ambulatory Visit: Payer: Self-pay | Admitting: *Deleted

## 2014-11-05 DIAGNOSIS — D474 Osteomyelofibrosis: Secondary | ICD-10-CM

## 2014-11-05 MED ORDER — PREDNISONE 5 MG PO TABS: 2.5000 mg | ORAL_TABLET | Freq: Every day | ORAL | Status: DC

## 2014-11-25 ENCOUNTER — Ambulatory Visit: Payer: BLUE CROSS/BLUE SHIELD | Admitting: Hematology & Oncology

## 2014-11-25 ENCOUNTER — Other Ambulatory Visit: Payer: BLUE CROSS/BLUE SHIELD

## 2014-12-03 ENCOUNTER — Other Ambulatory Visit (HOSPITAL_COMMUNITY)
Admission: RE | Admit: 2014-12-03 | Discharge: 2014-12-03 | Disposition: A | Discharge status: Home / Self Care | Payer: BLUE CROSS/BLUE SHIELD | Source: Ambulatory Visit | Attending: Hematology & Oncology | Admitting: Hematology & Oncology

## 2014-12-03 ENCOUNTER — Ambulatory Visit (HOSPITAL_BASED_OUTPATIENT_CLINIC_OR_DEPARTMENT_OTHER): Payer: BLUE CROSS/BLUE SHIELD | Admitting: Hematology & Oncology

## 2014-12-03 ENCOUNTER — Encounter: Payer: Self-pay | Admitting: Hematology & Oncology

## 2014-12-03 ENCOUNTER — Other Ambulatory Visit (HOSPITAL_BASED_OUTPATIENT_CLINIC_OR_DEPARTMENT_OTHER): Payer: BLUE CROSS/BLUE SHIELD

## 2014-12-03 VITALS — BP 136/50 | HR 77 | Temp 98.6°F | Resp 18 | Ht 70.0 in | Wt 177.0 lb

## 2014-12-03 DIAGNOSIS — D630 Anemia in neoplastic disease: Secondary | ICD-10-CM | POA: Diagnosis not present

## 2014-12-03 DIAGNOSIS — D474 Osteomyelofibrosis: Secondary | ICD-10-CM | POA: Insufficient documentation

## 2014-12-03 DIAGNOSIS — D7581 Myelofibrosis: Secondary | ICD-10-CM

## 2014-12-03 DIAGNOSIS — D649 Anemia, unspecified: Secondary | ICD-10-CM | POA: Diagnosis not present

## 2014-12-03 LAB — RETICULOCYTES (CHCC)
ABS Retic: 31.8 10*3/uL (ref 19.0–186.0)
RBC.: 1.87 MIL/uL — AB (ref 4.22–5.81)
RETIC CT PCT: 1.7 % (ref 0.4–2.3)

## 2014-12-03 LAB — CMP (CANCER CENTER ONLY)
ALK PHOS: 53 U/L (ref 26–84)
ALT(SGPT): 21 U/L (ref 10–47)
AST: 19 U/L (ref 11–38)
Albumin: 3 g/dL — ABNORMAL LOW (ref 3.3–5.5)
BUN: 18 mg/dL (ref 7–22)
CALCIUM: 9 mg/dL (ref 8.0–10.3)
CO2: 27 meq/L (ref 18–33)
Chloride: 103 mEq/L (ref 98–108)
Creat: 1.5 mg/dl — ABNORMAL HIGH (ref 0.6–1.2)
GLUCOSE: 89 mg/dL (ref 73–118)
POTASSIUM: 4.9 meq/L — AB (ref 3.3–4.7)
SODIUM: 137 meq/L (ref 128–145)
TOTAL PROTEIN: 6.7 g/dL (ref 6.4–8.1)
Total Bilirubin: 0.8 mg/dl (ref 0.20–1.60)

## 2014-12-03 LAB — CBC WITH DIFFERENTIAL (CANCER CENTER ONLY)
BASO#: 0.1 10*3/uL (ref 0.0–0.2)
BASO%: 1.9 % (ref 0.0–2.0)
EOS%: 0.1 % (ref 0.0–7.0)
Eosinophils Absolute: 0 10*3/uL (ref 0.0–0.5)
HCT: 16.3 % — ABNORMAL LOW (ref 38.7–49.9)
HGB: 5 g/dL — CL (ref 13.0–17.1)
LYMPH#: 0.9 10*3/uL (ref 0.9–3.3)
LYMPH%: 12.6 % — ABNORMAL LOW (ref 14.0–48.0)
MCH: 28.9 pg (ref 28.0–33.4)
MCHC: 30.7 g/dL — ABNORMAL LOW (ref 32.0–35.9)
MCV: 94 fL (ref 82–98)
MONO#: 1 10*3/uL — ABNORMAL HIGH (ref 0.1–0.9)
MONO%: 15.1 % — ABNORMAL HIGH (ref 0.0–13.0)
NEUT%: 70.3 % (ref 40.0–80.0)
NEUTROS ABS: 4.8 10*3/uL (ref 1.5–6.5)
Platelets: 327 10*3/uL (ref 145–400)
RBC: 1.73 10*6/uL — ABNORMAL LOW (ref 4.20–5.70)
RDW: 22.8 % — AB (ref 11.1–15.7)
WBC: 6.8 10*3/uL (ref 4.0–10.0)

## 2014-12-03 LAB — PREPARE RBC (CROSSMATCH)

## 2014-12-03 LAB — LACTATE DEHYDROGENASE: LDH: 289 U/L — ABNORMAL HIGH (ref 94–250)

## 2014-12-03 LAB — TECHNOLOGIST REVIEW CHCC SATELLITE

## 2014-12-03 NOTE — Progress Notes (Signed)
Hematology and Oncology Follow Up Visit  Brian Bowen [id number] 07/23/1951 64 y.o. 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  #2 prednisone 2.5 mg by mouth daily  #3 Jakafi 75m po bid     Interim History:  [name] back for followup. He does feel a bit tired.  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 April was too dry and May, so far, has been too wet.  He does feel a bit short of breath.  He's had no problems with his atrial flutter.  He's had no problems with fever. He's had no change in bowel or bladder habits.  We are watching iron studies. His ferritin is quite high. His iron saturation is 70%. His total iron is 195. We will have to be cautious with this.  Overall, his performance status is ECOG 1.   Medications:  Current outpatient prescriptions:  .  danazol (DANOCRINE) 200 MG capsule, TAKE ONE CAPSULE BY MOUTH 3 TIMES A DAY, Disp: 90 capsule, Rfl: 3 .  levalbuterol (XOPENEX HFA) 45 MCG/ACT inhaler, Inhale 2 puffs into the lungs every 6 (six) hours as needed for wheezing., Disp: , Rfl:  .  loratadine (CLARITIN) 10 MG tablet, Take 10 mg by mouth daily., Disp: , Rfl:  .  metolazone (ZAROXOLYN) 2.5 MG tablet, Take 1 tablet (2.5 mg total) by mouth as needed., Disp: 30 tablet, Rfl: 4 .  omeprazole (PRILOSEC) 20 MG capsule, Take 20 mg by mouth every morning. , Disp: , Rfl:  .  predniSONE (DELTASONE) 5 MG tablet, Take 0.5 tablets (2.5 mg total) by mouth daily with breakfast., Disp: 30 tablet, Rfl: 3 .  ruxolitinib phosphate (JAKAFI) 5 MG tablet, Take 1 tablet (5 mg total) by mouth 2 (two) times daily. Total of 10 mg., Disp: 60 tablet, Rfl: 0 .  ULORIC 40 MG tablet, TAKE 1 TABLET BY MOUTH EVERY DAY, Disp: 30 tablet, Rfl: 4  Allergies: No Known Allergies  Past  Medical History, Surgical history, Social history, and Family History were reviewed and updated.  Review of Systems: As above  Physical Exam:  height is _0  (1.778 m) and weight is 177 lb (80.287 kg). His oral temperature is 98.6 F (37 C). His blood pressure is 136/50 and his pulse is 77. His respiration is 18.   Well-developed and well-nourished white cell and. Head and neck exam shows no ocular or oral lesions. He has no palpable cervical or supraclavicular lymph nodes. Lungs are clear. Cardiac exam regular rate and rhythm a normal S1 and S2.   He has a 1/6 systolic ejection murmur. Abdomen is soft. He has good bowel sounds. There is no fluid wave. There is no palpable abdominal mass. There is no palpable liver tip. His spleen tip is about 7cm below the left costal margin. Back exam shows no tenderness over the spine ribs or hips. Extremities shows no clubbing or cyanosis. He has 1+ edema in his lower legs. Skin exam shows no rashes, ecchymosis or petechia. Neurological exam shows no focal neurological deficits.  Lab Results  Component Value Date   WBC 6.8 12/03/2014   HGB 5.0* 12/03/2014   HCT 16.3* 12/03/2014   MCV 94 12/03/2014   PLT 327 12/03/2014     Chemistry      Component  Value Date/Time   NA 137 12/03/2014 1310   NA 138 10/01/2014 1202   NA 142 10/01/2013 1256   K 4.9* 12/03/2014 1310   K 4.4 10/01/2014 1202   K 4.5 10/01/2013 1256   CL 103 12/03/2014 1310   CL 104 10/01/2014 1202   CO2 27 12/03/2014 1310   CO2 24 10/01/2014 1202   CO2 27 10/01/2013 1256   BUN 18 12/03/2014 1310   BUN 23 10/01/2014 1202   BUN 18.0 10/01/2013 1256   CREATININE 1.5* 12/03/2014 1310   CREATININE 1.63* 10/01/2014 1202   CREATININE 1.3 10/01/2013 1256      Component Value Date/Time   CALCIUM 9.0 12/03/2014 1310   CALCIUM 8.7 10/01/2014 1202   CALCIUM 9.0 10/01/2013 1256   ALKPHOS 53 12/03/2014 1310   ALKPHOS 38* 10/01/2014 1202   ALKPHOS 39* 10/01/2013 1256   AST 19 12/03/2014  1310   AST 14 10/01/2014 1202   AST 17 10/01/2013 1256   ALT 21 12/03/2014 1310   ALT 18 10/01/2014 1202   ALT 27 10/01/2013 1256   BILITOT 0.80 12/03/2014 1310   BILITOT 0.6 10/01/2014 1202   BILITOT 0.58 10/01/2013 1256         Impression and Plan: Brian Bowen is 64 year old gentleman with myelofibrosis. Again, he is JAK2  positive. He's on JAKAFI which helps control his systemic symptoms incredibly well.  He is quite anemic again. I will of that worried that we have to transfuse him again. I think that this might be an indicator that his disease might be getting a little bit worse. At some point, we may have to consider another bone marrow biopsy on him.  We will go ahead and transfuse him on Monday.  I'm sure that the JAKAFI is part of the problem was an anemia but yet, it controls his systemic symptoms quite well.  I will get him back in one month to be seen.    Volanda Napoleon, MD 5/6/20162:36 PM

## 2014-12-06 ENCOUNTER — Ambulatory Visit (HOSPITAL_BASED_OUTPATIENT_CLINIC_OR_DEPARTMENT_OTHER): Payer: BLUE CROSS/BLUE SHIELD

## 2014-12-06 ENCOUNTER — Encounter: Payer: Self-pay | Admitting: Hematology & Oncology

## 2014-12-06 ENCOUNTER — Other Ambulatory Visit (HOSPITAL_COMMUNITY)
Admission: RE | Admit: 2014-12-06 | Discharge: 2014-12-06 | Disposition: A | Discharge status: Home / Self Care | Payer: BLUE CROSS/BLUE SHIELD | Source: Ambulatory Visit | Attending: Hematology & Oncology | Admitting: Hematology & Oncology

## 2014-12-06 VITALS — BP 129/56 | HR 77 | Temp 98.2°F | Resp 16 | Ht 70.0 in | Wt 177.0 lb

## 2014-12-06 DIAGNOSIS — D630 Anemia in neoplastic disease: Secondary | ICD-10-CM

## 2014-12-06 DIAGNOSIS — D72819 Decreased white blood cell count, unspecified: Secondary | ICD-10-CM | POA: Insufficient documentation

## 2014-12-06 DIAGNOSIS — D474 Osteomyelofibrosis: Secondary | ICD-10-CM

## 2014-12-06 LAB — IRON AND TIBC CHCC
%SAT: 51 % (ref 20–55)
Iron: 136 ug/dL (ref 42–163)
TIBC: 269 ug/dL (ref 202–409)
UIBC: 133 ug/dL (ref 117–376)

## 2014-12-06 LAB — PREPARE RBC (CROSSMATCH)

## 2014-12-06 LAB — FERRITIN CHCC

## 2014-12-06 MED ORDER — FUROSEMIDE 10 MG/ML IJ SOLN
INTRAMUSCULAR | Status: AC
  Filled 2014-12-06: qty 4

## 2014-12-06 MED ORDER — FUROSEMIDE 10 MG/ML IJ SOLN
20.0000 mg | Freq: Once | INTRAMUSCULAR | Status: AC
  Administered 2014-12-06: 20 mg via INTRAVENOUS

## 2014-12-06 MED ORDER — DIPHENHYDRAMINE HCL 25 MG PO CAPS
25.0000 mg | ORAL_CAPSULE | Freq: Once | ORAL | Status: AC
  Administered 2014-12-06: 25 mg via ORAL

## 2014-12-06 MED ORDER — ACETAMINOPHEN 325 MG PO TABS
ORAL_TABLET | ORAL | Status: AC
  Filled 2014-12-06: qty 2

## 2014-12-06 MED ORDER — DIPHENHYDRAMINE HCL 25 MG PO CAPS
ORAL_CAPSULE | ORAL | Status: AC
  Filled 2014-12-06: qty 1

## 2014-12-06 MED ORDER — ACETAMINOPHEN 325 MG PO TABS
650.0000 mg | ORAL_TABLET | Freq: Once | ORAL | Status: AC
  Administered 2014-12-06: 650 mg via ORAL

## 2014-12-06 NOTE — Patient Instructions (Signed)

## 2014-12-07 LAB — TYPE AND SCREEN
ABO/RH(D): O POS
ANTIBODY SCREEN: NEGATIVE
Unit division: 0
Unit division: 0

## 2014-12-16 ENCOUNTER — Other Ambulatory Visit: Payer: Self-pay | Admitting: *Deleted

## 2014-12-16 DIAGNOSIS — D474 Osteomyelofibrosis: Secondary | ICD-10-CM

## 2014-12-16 MED ORDER — PREDNISONE 5 MG PO TABS: 5.0000 mg | ORAL_TABLET | Freq: Every day | ORAL | Status: DC

## 2014-12-31 ENCOUNTER — Other Ambulatory Visit (HOSPITAL_BASED_OUTPATIENT_CLINIC_OR_DEPARTMENT_OTHER): Payer: BLUE CROSS/BLUE SHIELD

## 2014-12-31 ENCOUNTER — Ambulatory Visit (HOSPITAL_BASED_OUTPATIENT_CLINIC_OR_DEPARTMENT_OTHER): Payer: BLUE CROSS/BLUE SHIELD | Admitting: Hematology & Oncology

## 2014-12-31 ENCOUNTER — Encounter: Payer: Self-pay | Admitting: Hematology & Oncology

## 2014-12-31 VITALS — BP 145/57 | HR 86 | Temp 98.2°F | Resp 18 | Ht 69.0 in | Wt 177.0 lb

## 2014-12-31 DIAGNOSIS — D649 Anemia, unspecified: Secondary | ICD-10-CM | POA: Diagnosis not present

## 2014-12-31 DIAGNOSIS — D474 Osteomyelofibrosis: Secondary | ICD-10-CM

## 2014-12-31 DIAGNOSIS — D7581 Myelofibrosis: Secondary | ICD-10-CM | POA: Diagnosis not present

## 2014-12-31 LAB — CBC WITH DIFFERENTIAL (CANCER CENTER ONLY)
BASO#: 0.2 10*3/uL (ref 0.0–0.2)
BASO%: 2.3 % — ABNORMAL HIGH (ref 0.0–2.0)
EOS%: 0.1 % (ref 0.0–7.0)
Eosinophils Absolute: 0 10*3/uL (ref 0.0–0.5)
HCT: 18 % — ABNORMAL LOW (ref 38.7–49.9)
HGB: 5.5 g/dL — CL (ref 13.0–17.1)
LYMPH#: 1 10*3/uL (ref 0.9–3.3)
LYMPH%: 12 % — ABNORMAL LOW (ref 14.0–48.0)
MCH: 28.5 pg (ref 28.0–33.4)
MCHC: 30.6 g/dL — AB (ref 32.0–35.9)
MCV: 93 fL (ref 82–98)
MONO#: 0.8 10*3/uL (ref 0.1–0.9)
MONO%: 9.9 % (ref 0.0–13.0)
NEUT#: 6 10*3/uL (ref 1.5–6.5)
NEUT%: 75.7 % (ref 40.0–80.0)
Platelets: 349 10*3/uL (ref 145–400)
RBC: 1.93 10*6/uL — AB (ref 4.20–5.70)
RDW: 22.2 % — ABNORMAL HIGH (ref 11.1–15.7)
WBC: 8 10*3/uL (ref 4.0–10.0)

## 2014-12-31 LAB — COMPREHENSIVE METABOLIC PANEL
ALK PHOS: 56 U/L (ref 39–117)
ALT: 16 U/L (ref 0–53)
AST: 15 U/L (ref 0–37)
Albumin: 3.9 g/dL (ref 3.5–5.2)
BUN: 22 mg/dL (ref 6–23)
CHLORIDE: 106 meq/L (ref 96–112)
CO2: 22 mEq/L (ref 19–32)
CREATININE: 1.49 mg/dL — AB (ref 0.50–1.35)
Calcium: 8.4 mg/dL (ref 8.4–10.5)
GLUCOSE: 101 mg/dL — AB (ref 70–99)
Potassium: 4.5 mEq/L (ref 3.5–5.3)
SODIUM: 139 meq/L (ref 135–145)
TOTAL PROTEIN: 6.2 g/dL (ref 6.0–8.3)
Total Bilirubin: 0.6 mg/dL (ref 0.2–1.2)

## 2014-12-31 LAB — RETICULOCYTES (CHCC)
ABS Retic: 31.1 10*3/uL (ref 19.0–186.0)
RBC.: 2.07 MIL/uL — AB (ref 4.22–5.81)
RETIC CT PCT: 1.5 % (ref 0.4–2.3)

## 2014-12-31 LAB — TECHNOLOGIST REVIEW CHCC SATELLITE: Tech Review: 3

## 2014-12-31 LAB — HOLD TUBE, BLOOD BANK - CHCC SATELLITE

## 2014-12-31 LAB — CHCC SATELLITE - SMEAR

## 2014-12-31 NOTE — Progress Notes (Signed)
Hematology and Oncology Follow Up Visit  Brian Bowen [id number] Jun 18, 1951 64 y.o. 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  #2 prednisone 5 mg by mouth daily  #3 Jakafi 5mg  po bid     Interim History:  Brian Bowen is back for followup. He feels much better. We transfuse him about a month ago. We increased his prednisone up to 5 mg a day. This also made a big difference for him.  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 Hopefully he can get some done this weekend.  His appetite has been good. He's had no nausea or vomiting. He's had no sweats. He's had no change in bowel or bladder habits.  His leg swelling is much better.  He's had no cough. He's had no shortness of breath.  He's not noted any bleeding.  Overall, his performance status is ECOG 1.   Medications:  Current outpatient prescriptions:  .  danazol (DANOCRINE) 200 MG capsule, TAKE ONE CAPSULE BY MOUTH 3 TIMES A DAY, Disp: 90 capsule, Rfl: 3 .  levalbuterol (XOPENEX HFA) 45 MCG/ACT inhaler, Inhale 2 puffs into the lungs every 6 (six) hours as needed for wheezing., Disp: , Rfl:  .  loratadine (CLARITIN) 10 MG tablet, Take 10 mg by mouth daily., Disp: , Rfl:  .  metolazone (ZAROXOLYN) 2.5 MG tablet, Take 1 tablet (2.5 mg total) by mouth as needed., Disp: 30 tablet, Rfl: 4 .  omeprazole (PRILOSEC) 20 MG capsule, Take 20 mg by mouth every morning. , Disp: , Rfl:  .  predniSONE (DELTASONE) 5 MG tablet, Take 1 tablet (5 mg total) by mouth daily with breakfast., Disp: 30 tablet, Rfl: 3 .  ruxolitinib phosphate (JAKAFI) 5 MG tablet, Take 1 tablet (5 mg total) by mouth 2 (two) times daily. Total of 10 mg., Disp: 60 tablet, Rfl: 0 .  ULORIC 40 MG tablet, TAKE 1 TABLET BY MOUTH EVERY DAY, Disp: 30 tablet, Rfl: 4  Allergies: No Known  Allergies  Past Medical History, Surgical history, Social history, and Family History were reviewed and updated.  Review of Systems: As above  Physical Exam:  height is 5\' 9"  (1.753 m) and weight is 177 lb (80.287 kg). His oral temperature is 98.2 F (36.8 C). His blood pressure is 145/57 and his pulse is 86. His respiration is 18.   Well-developed and well-nourished white cell and. Head and neck exam shows no ocular or oral lesions. He has no palpable cervical or supraclavicular lymph nodes. Lungs are clear. Cardiac exam regular rate and rhythm a normal S1 and S2.   He has a 1/6 systolic ejection murmur. Abdomen is soft. He has good bowel sounds. There is no fluid wave. There is no palpable abdominal mass. There is no palpable liver tip. His spleen tip is about 5cm below the left costal margin. Back exam shows no tenderness over the spine ribs or hips. Extremities shows no clubbing or cyanosis. He has 1+ edema in his lower legs. Skin exam shows no rashes, ecchymosis or petechia. Neurological exam shows no focal neurological deficits.  Lab Results  Component Value Date   WBC 8.0 12/31/2014   HGB 5.5* 12/31/2014   HCT 18.0* 12/31/2014   MCV 93 12/31/2014   PLT 349 12/31/2014     Chemistry  Component Value Date/Time   NA 137 12/03/2014 1310   NA 138 10/01/2014 1202   NA 142 10/01/2013 1256   K 4.9* 12/03/2014 1310   K 4.4 10/01/2014 1202   K 4.5 10/01/2013 1256   CL 103 12/03/2014 1310   CL 104 10/01/2014 1202   CO2 27 12/03/2014 1310   CO2 24 10/01/2014 1202   CO2 27 10/01/2013 1256   BUN 18 12/03/2014 1310   BUN 23 10/01/2014 1202   BUN 18.0 10/01/2013 1256   CREATININE 1.5* 12/03/2014 1310   CREATININE 1.63* 10/01/2014 1202   CREATININE 1.3 10/01/2013 1256      Component Value Date/Time   CALCIUM 9.0 12/03/2014 1310   CALCIUM 8.7 10/01/2014 1202   CALCIUM 9.0 10/01/2013 1256   ALKPHOS 53 12/03/2014 1310   ALKPHOS 38* 10/01/2014 1202   ALKPHOS 39* 10/01/2013 1256     AST 19 12/03/2014 1310   AST 14 10/01/2014 1202   AST 17 10/01/2013 1256   ALT 21 12/03/2014 1310   ALT 18 10/01/2014 1202   ALT 27 10/01/2013 1256   BILITOT 0.80 12/03/2014 1310   BILITOT 0.6 10/01/2014 1202   BILITOT 0.58 10/01/2013 1256         Impression and Plan: Brian Bowen is 64 year old gentleman with myelofibrosis. Again, he is JAK2  positive. He's on JAKAFI which helps control his systemic symptoms incredibly well.  He is still quite anemic. This may be from the [place]. Also could be from his underlying disease.  One thing that I noted was that his iron levels are on the high side. This might be an issue with hematopoiesis. As such, I may want to consider some iron chelation for him. We could possibly consider the oral agent, JADENU, which I think he would be able to tolerate.  He does not feel like he needs a transfusion. He says that increasing his prednisone up to 5 mg really helped him out.  I will plan to get him back in 3 weeks. I want to make sure we stay in close contact with him so that we can make adjustments with his medicines.   Volanda Napoleon, MD 6/3/20163:57 PM

## 2015-01-03 ENCOUNTER — Telehealth: Payer: Self-pay | Admitting: *Deleted

## 2015-01-03 LAB — IRON AND TIBC CHCC
%SAT: 64 % — ABNORMAL HIGH (ref 20–55)
IRON: 155 ug/dL (ref 42–163)
TIBC: 241 ug/dL (ref 202–409)
UIBC: 86 ug/dL — ABNORMAL LOW (ref 117–376)

## 2015-01-03 LAB — FERRITIN CHCC

## 2015-01-03 NOTE — Telephone Encounter (Signed)
Patient would like to know if he can reduce his Jakafi to once a day. His hgb has been dropping more rapidly and he is wondering if the [facility] can be reduced to once daily to see if this has a positive effect on his hgb level. Spoke to Dr Marin Olp and he is fine with reducing the dose to once daily and seeing how this effects the hgb level when he is back in three weeks. Communicated this to patient who will go to once a day dosing.

## 2015-01-19 ENCOUNTER — Ambulatory Visit (HOSPITAL_COMMUNITY)
Admission: RE | Admit: 2015-01-19 | Discharge: 2015-01-19 | Disposition: A | Discharge status: Home / Self Care | Payer: BLUE CROSS/BLUE SHIELD | Source: Ambulatory Visit | Attending: Hematology & Oncology | Admitting: Hematology & Oncology

## 2015-01-19 ENCOUNTER — Other Ambulatory Visit: Payer: Self-pay | Admitting: *Deleted

## 2015-01-19 ENCOUNTER — Telehealth: Payer: Self-pay | Admitting: *Deleted

## 2015-01-19 DIAGNOSIS — D474 Osteomyelofibrosis: Secondary | ICD-10-CM

## 2015-01-19 NOTE — Telephone Encounter (Signed)
Patient c/o severe weakness with periods of dizziness and shortness of breath. He states he is in his 'lowest low' and needs a blood transfusion. Spoke to Dr Marin Olp. Appointments made for lab draw tomorrow am with a tentative blood transfusion on Friday. Patient is agreeable to plan.

## 2015-01-20 ENCOUNTER — Other Ambulatory Visit (HOSPITAL_BASED_OUTPATIENT_CLINIC_OR_DEPARTMENT_OTHER): Payer: BLUE CROSS/BLUE SHIELD

## 2015-01-20 ENCOUNTER — Ambulatory Visit (HOSPITAL_BASED_OUTPATIENT_CLINIC_OR_DEPARTMENT_OTHER): Payer: BLUE CROSS/BLUE SHIELD

## 2015-01-20 ENCOUNTER — Telehealth: Payer: Self-pay | Admitting: *Deleted

## 2015-01-20 VITALS — BP 130/57 | HR 76 | Temp 98.4°F | Resp 18

## 2015-01-20 DIAGNOSIS — D474 Osteomyelofibrosis: Secondary | ICD-10-CM

## 2015-01-20 DIAGNOSIS — D7581 Myelofibrosis: Secondary | ICD-10-CM

## 2015-01-20 DIAGNOSIS — D649 Anemia, unspecified: Secondary | ICD-10-CM | POA: Diagnosis present

## 2015-01-20 LAB — CBC WITH DIFFERENTIAL (CANCER CENTER ONLY)
BASO#: 0.1 10*3/uL (ref 0.0–0.2)
BASO%: 2.1 % — AB (ref 0.0–2.0)
EOS%: 0.1 % (ref 0.0–7.0)
Eosinophils Absolute: 0 10*3/uL (ref 0.0–0.5)
HCT: 12.9 % — ABNORMAL LOW (ref 38.7–49.9)
HGB: 3.8 g/dL — CL (ref 13.0–17.1)
LYMPH#: 0.8 10*3/uL — ABNORMAL LOW (ref 0.9–3.3)
LYMPH%: 11.5 % — AB (ref 14.0–48.0)
MCH: 27.3 pg — ABNORMAL LOW (ref 28.0–33.4)
MCHC: 29.5 g/dL — ABNORMAL LOW (ref 32.0–35.9)
MCV: 93 fL (ref 82–98)
MONO#: 0.9 10*3/uL (ref 0.1–0.9)
MONO%: 12.7 % (ref 0.0–13.0)
NEUT#: 5 10*3/uL (ref 1.5–6.5)
NEUT%: 73.6 % (ref 40.0–80.0)
PLATELETS: 280 10*3/uL (ref 145–400)
RBC: 1.39 10*6/uL — AB (ref 4.20–5.70)
RDW: 25.2 % — AB (ref 11.1–15.7)
WBC: 6.8 10*3/uL (ref 4.0–10.0)

## 2015-01-20 LAB — TECHNOLOGIST REVIEW CHCC SATELLITE

## 2015-01-20 LAB — HOLD TUBE, BLOOD BANK - CHCC SATELLITE

## 2015-01-20 LAB — PREPARE RBC (CROSSMATCH)

## 2015-01-20 MED ORDER — FUROSEMIDE 10 MG/ML IJ SOLN
INTRAMUSCULAR | Status: AC
  Filled 2015-01-20: qty 4

## 2015-01-20 MED ORDER — DIPHENHYDRAMINE HCL 25 MG PO CAPS
ORAL_CAPSULE | ORAL | Status: AC
  Filled 2015-01-20: qty 1

## 2015-01-20 MED ORDER — DIPHENHYDRAMINE HCL 25 MG PO CAPS
25.0000 mg | ORAL_CAPSULE | Freq: Once | ORAL | Status: AC
  Administered 2015-01-20: 25 mg via ORAL

## 2015-01-20 MED ORDER — ACETAMINOPHEN 325 MG PO TABS
ORAL_TABLET | ORAL | Status: AC
  Filled 2015-01-20: qty 2

## 2015-01-20 MED ORDER — FUROSEMIDE 10 MG/ML IJ SOLN
20.0000 mg | Freq: Once | INTRAMUSCULAR | Status: AC
  Administered 2015-01-20: 20 mg via INTRAVENOUS

## 2015-01-20 MED ORDER — ACETAMINOPHEN 325 MG PO TABS
650.0000 mg | ORAL_TABLET | Freq: Once | ORAL | Status: AC
  Administered 2015-01-20: 650 mg via ORAL

## 2015-01-20 NOTE — Patient Instructions (Signed)

## 2015-01-20 NOTE — Telephone Encounter (Signed)
Critical Value HGB 3.8  Called Dr Burr Medico, on-call physician. Patient was scheduled for transfusion tomorrow. We will bring patient into office this morning for transfusion instead. Dr Burr Medico also would like patient assessed and if he expresses any chest pain, signs of bleeding or any other significant problem to call her back. Spoke with patient who will return to office this morning.

## 2015-01-21 LAB — TYPE AND SCREEN
ABO/RH(D): O POS
ANTIBODY SCREEN: NEGATIVE
UNIT DIVISION: 0
Unit division: 0

## 2015-01-28 ENCOUNTER — Other Ambulatory Visit (HOSPITAL_BASED_OUTPATIENT_CLINIC_OR_DEPARTMENT_OTHER): Payer: BLUE CROSS/BLUE SHIELD

## 2015-01-28 ENCOUNTER — Ambulatory Visit (HOSPITAL_BASED_OUTPATIENT_CLINIC_OR_DEPARTMENT_OTHER): Payer: BLUE CROSS/BLUE SHIELD | Admitting: Hematology & Oncology

## 2015-01-28 ENCOUNTER — Encounter: Payer: Self-pay | Admitting: Hematology & Oncology

## 2015-01-28 ENCOUNTER — Other Ambulatory Visit: Payer: Self-pay | Admitting: *Deleted

## 2015-01-28 DIAGNOSIS — D649 Anemia, unspecified: Secondary | ICD-10-CM

## 2015-01-28 DIAGNOSIS — D7581 Myelofibrosis: Secondary | ICD-10-CM | POA: Diagnosis not present

## 2015-01-28 DIAGNOSIS — D474 Osteomyelofibrosis: Secondary | ICD-10-CM

## 2015-01-28 DIAGNOSIS — I4892 Unspecified atrial flutter: Secondary | ICD-10-CM

## 2015-01-28 LAB — CBC WITH DIFFERENTIAL (CANCER CENTER ONLY)
BASO#: 0.1 10*3/uL (ref 0.0–0.2)
BASO%: 2.5 % — ABNORMAL HIGH (ref 0.0–2.0)
EOS%: 0.2 % (ref 0.0–7.0)
Eosinophils Absolute: 0 10*3/uL (ref 0.0–0.5)
HCT: 18.8 % — ABNORMAL LOW (ref 38.7–49.9)
HGB: 5.8 g/dL — CL (ref 13.0–17.1)
LYMPH#: 0.6 10*3/uL — AB (ref 0.9–3.3)
LYMPH%: 10.6 % — AB (ref 14.0–48.0)
MCH: 29.1 pg (ref 28.0–33.4)
MCHC: 30.9 g/dL — ABNORMAL LOW (ref 32.0–35.9)
MCV: 95 fL (ref 82–98)
MONO#: 0.6 10*3/uL (ref 0.1–0.9)
MONO%: 11.8 % (ref 0.0–13.0)
NEUT%: 74.9 % (ref 40.0–80.0)
NEUTROS ABS: 4 10*3/uL (ref 1.5–6.5)
PLATELETS: 264 10*3/uL (ref 145–400)
RBC: 1.99 10*6/uL — ABNORMAL LOW (ref 4.20–5.70)
RDW: 21.8 % — ABNORMAL HIGH (ref 11.1–15.7)
WBC: 5.3 10*3/uL (ref 4.0–10.0)

## 2015-01-28 LAB — CMP (CANCER CENTER ONLY)
ALBUMIN: 2.8 g/dL — AB (ref 3.3–5.5)
ALK PHOS: 55 U/L (ref 26–84)
ALT(SGPT): 21 U/L (ref 10–47)
AST: 20 U/L (ref 11–38)
BILIRUBIN TOTAL: 0.9 mg/dL (ref 0.20–1.60)
BUN: 17 mg/dL (ref 7–22)
CALCIUM: 8.9 mg/dL (ref 8.0–10.3)
CHLORIDE: 108 meq/L (ref 98–108)
CO2: 25 meq/L (ref 18–33)
CREATININE: 1.7 mg/dL — AB (ref 0.6–1.2)
Glucose, Bld: 92 mg/dL (ref 73–118)
Potassium: 4.2 mEq/L (ref 3.3–4.7)
SODIUM: 142 meq/L (ref 128–145)
Total Protein: 6.3 g/dL — ABNORMAL LOW (ref 6.4–8.1)

## 2015-01-28 LAB — IRON AND TIBC CHCC
%SAT: 48 % (ref 20–55)
Iron: 117 ug/dL (ref 42–163)
TIBC: 241 ug/dL (ref 202–409)
UIBC: 125 ug/dL (ref 117–376)

## 2015-01-28 LAB — RETICULOCYTES (CHCC)
ABS RETIC: 26.5 10*3/uL (ref 19.0–186.0)
RBC.: 2.04 MIL/uL — ABNORMAL LOW (ref 4.22–5.81)
RETIC CT PCT: 1.3 % (ref 0.4–2.3)

## 2015-01-28 LAB — LACTATE DEHYDROGENASE: LDH: 249 U/L (ref 94–250)

## 2015-01-28 LAB — CHCC SATELLITE - SMEAR

## 2015-01-28 LAB — TECHNOLOGIST REVIEW CHCC SATELLITE

## 2015-01-28 LAB — HOLD TUBE, BLOOD BANK - CHCC SATELLITE

## 2015-01-28 LAB — FERRITIN CHCC

## 2015-01-28 MED ORDER — DEFERASIROX 360 MG PO TABS: 1080.0000 mg | ORAL_TABLET | Freq: Every day | ORAL | Status: DC

## 2015-01-28 NOTE — Progress Notes (Signed)
Hematology and Oncology Follow Up Visit  Brian Bowen [id number] 01/16/1951 64 y.o. 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  #2 prednisone 5 mg by mouth daily  #3 Jakafi 5mg  po qd     Interim History:  Mr.  Brian Bowen is back for followup. He is complaining of vertigo. This is uncommon. He said it happened after his last transfusion. His last infusion was a week or so ago. I think that the vertigo probably is more from the myelofibrosis and the bony changes more so than him being transfused.  He also has a rash on his neck. It looks like ringworm. I told him to try some over-the-counter Lotrimin cream or lotion.  His iron studies have continued to be high. I think we are going to have to try to get him on some iron chelating therapy so that we can try to decrease his iron load. This also may help with his anemia.  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 The local [place] population is not yet coming out to eat the corn.  He's had no sweating. He's had no nausea or vomiting. He's had no worsening leg swelling. His atrial flutter has not been a problem for him.   Overall, his performance status is ECOG 1.   Medications:  Current outpatient prescriptions:  .  danazol (DANOCRINE) 200 MG capsule, TAKE ONE CAPSULE BY MOUTH 3 TIMES A DAY, Disp: 90 capsule, Rfl: 3 .  levalbuterol (XOPENEX HFA) 45 MCG/ACT inhaler, Inhale 2 puffs into the lungs every 6 (six) hours as needed for wheezing., Disp: , Rfl:  .  loratadine (CLARITIN) 10 MG tablet, Take 10 mg by mouth daily., Disp: , Rfl:  .  metolazone (ZAROXOLYN) 2.5 MG tablet, Take 1 tablet (2.5 mg total) by mouth as needed., Disp: 30 tablet, Rfl: 4 .  omeprazole (PRILOSEC) 20 MG capsule, Take 20 mg by mouth every morning. , Disp: , Rfl:  .   predniSONE (DELTASONE) 5 MG tablet, Take 1 tablet (5 mg total) by mouth daily with breakfast., Disp: 30 tablet, Rfl: 3 .  ruxolitinib phosphate (JAKAFI) 5 MG tablet, Take 1 tablet (5 mg total) by mouth 2 (two) times daily. Total of 10 mg. (Patient taking differently: Take 5 mg by mouth every morning. ), Disp: 60 tablet, Rfl: 0 .  ULORIC 40 MG tablet, TAKE 1 TABLET BY MOUTH EVERY DAY, Disp: 30 tablet, Rfl: 4 .  Deferasirox (JADENU) 360 MG TABS, Take 1,080 mg by mouth daily., Disp: 90 tablet, Rfl: 5  Allergies: No Known Allergies  Past Medical History, Surgical history, Social history, and Family History were reviewed and updated.  Review of Systems: As above  Physical Exam:  height is 5\' 9"  (1.753 m) and weight is 178 lb (80.74 kg). His oral temperature is 98.2 F (36.8 C). His blood pressure is 132/42 and his pulse is 64. His respiration is 18.   Well-developed and well-nourished white cell and. Head and neck exam shows no ocular or oral lesions. He has no palpable cervical or supraclavicular lymph nodes. Lungs are clear. Cardiac exam regular rate and rhythm a normal S1 and S2.   He has a 1/6 systolic ejection murmur. Abdomen is soft. He has good bowel sounds. There is no fluid wave. There is no palpable abdominal mass.  There is no palpable liver tip. His spleen tip is about 5cm below the left costal margin. Back exam shows no tenderness over the spine ribs or hips. Extremities shows no clubbing or cyanosis. He has 1+ edema in his lower legs. Skin exam shows no rashes, ecchymosis or petechia. Neurological exam shows no focal neurological deficits.  Lab Results  Component Value Date   WBC 5.3 01/28/2015   HGB 5.8* 01/28/2015   HCT 18.8* 01/28/2015   MCV 95 01/28/2015   PLT 264 01/28/2015     Chemistry      Component Value Date/Time   NA 142 01/28/2015 0950   NA 139 12/31/2014 1317   NA 142 10/01/2013 1256   K 4.2 01/28/2015 0950   K 4.5 12/31/2014 1317   K 4.5 10/01/2013 1256   CL  108 01/28/2015 0950   CL 106 12/31/2014 1317   CO2 25 01/28/2015 0950   CO2 22 12/31/2014 1317   CO2 27 10/01/2013 1256   BUN 17 01/28/2015 0950   BUN 22 12/31/2014 1317   BUN 18.0 10/01/2013 1256   CREATININE 1.7* 01/28/2015 0950   CREATININE 1.49* 12/31/2014 1317   CREATININE 1.3 10/01/2013 1256      Component Value Date/Time   CALCIUM 8.9 01/28/2015 0950   CALCIUM 8.4 12/31/2014 1317   CALCIUM 9.0 10/01/2013 1256   ALKPHOS 55 01/28/2015 0950   ALKPHOS 56 12/31/2014 1317   ALKPHOS 39* 10/01/2013 1256   AST 20 01/28/2015 0950   AST 15 12/31/2014 1317   AST 17 10/01/2013 1256   ALT 21 01/28/2015 0950   ALT 16 12/31/2014 1317   ALT 27 10/01/2013 1256   BILITOT 0.90 01/28/2015 0950   BILITOT 0.6 12/31/2014 1317   BILITOT 0.58 10/01/2013 1256         Impression and Plan: [name] is 64 year old gentleman with myelofibrosis. Again, he is JAK2  positive. He's on JAKAFI which helps control his systemic symptoms incredibly well.  He is still quite anemic. This may be from the [place]. Also could be from his underlying disease.  I think that we have to get him started on iron chelating therapy. I just think that the anemia could be in part secondary to his iron overload.  I talked to him about this. He understands why the iron is high. He understands the nature of treatment.  We will start him on JADENU at a dose of 1080 mg a day. I think this is be reasonable. I taught him about some of the side effects. I told him to watch out for any issues with nausea and vomiting, difficulty urinating, diarrhea, jaundice, melena or bright red blood per rectum. Again I think this would be highly and likely but yet it's a side effect that is known to happen.  I want him back in about 3 weeks or so area and we are going to have to keep a close eye out on him while he is on JADENU.  I spent about 35 minutes with him today.  Volanda Napoleon, MD 7/1/20161:44 PM

## 2015-02-03 ENCOUNTER — Telehealth: Payer: Self-pay | Admitting: Hematology & Oncology

## 2015-02-03 NOTE — Telephone Encounter (Addendum)
[employer] has APPROVED the JAKAFI tablet for patient.  Auth: [id number] Valid: 01/26/2015 - 01/26/2016   and   [facility] has APPROVED the [place] tablet for patient assistance.  Valid: 01/27/2015 - 01/27/2016  P: [phone]    COPY SCANNED

## 2015-02-13 ENCOUNTER — Other Ambulatory Visit: Payer: Self-pay | Admitting: Hematology & Oncology

## 2015-03-03 ENCOUNTER — Ambulatory Visit (HOSPITAL_BASED_OUTPATIENT_CLINIC_OR_DEPARTMENT_OTHER): Payer: BLUE CROSS/BLUE SHIELD | Admitting: Hematology & Oncology

## 2015-03-03 ENCOUNTER — Ambulatory Visit (HOSPITAL_BASED_OUTPATIENT_CLINIC_OR_DEPARTMENT_OTHER): Payer: BLUE CROSS/BLUE SHIELD

## 2015-03-03 ENCOUNTER — Other Ambulatory Visit: Payer: Self-pay | Admitting: Family

## 2015-03-03 ENCOUNTER — Encounter: Payer: Self-pay | Admitting: Hematology & Oncology

## 2015-03-03 ENCOUNTER — Other Ambulatory Visit (HOSPITAL_BASED_OUTPATIENT_CLINIC_OR_DEPARTMENT_OTHER): Payer: BLUE CROSS/BLUE SHIELD

## 2015-03-03 ENCOUNTER — Ambulatory Visit (HOSPITAL_COMMUNITY)
Admission: RE | Admit: 2015-03-03 | Discharge: 2015-03-03 | Disposition: A | Discharge status: Home / Self Care | Payer: BLUE CROSS/BLUE SHIELD | Source: Ambulatory Visit | Attending: Hematology & Oncology | Admitting: Hematology & Oncology

## 2015-03-03 ENCOUNTER — Other Ambulatory Visit: Payer: Self-pay | Admitting: *Deleted

## 2015-03-03 VITALS — BP 134/63 | HR 66 | Temp 98.3°F | Resp 18

## 2015-03-03 VITALS — BP 137/68 | HR 81 | Temp 98.1°F | Resp 18 | Ht 69.0 in | Wt 177.0 lb

## 2015-03-03 DIAGNOSIS — D7581 Myelofibrosis: Secondary | ICD-10-CM | POA: Diagnosis not present

## 2015-03-03 DIAGNOSIS — D474 Osteomyelofibrosis: Secondary | ICD-10-CM | POA: Insufficient documentation

## 2015-03-03 DIAGNOSIS — D649 Anemia, unspecified: Secondary | ICD-10-CM | POA: Diagnosis not present

## 2015-03-03 DIAGNOSIS — I4892 Unspecified atrial flutter: Secondary | ICD-10-CM | POA: Diagnosis not present

## 2015-03-03 LAB — CMP (CANCER CENTER ONLY)
ALT: 19 U/L (ref 10–47)
AST: 20 U/L (ref 11–38)
Albumin: 2.9 g/dL — ABNORMAL LOW (ref 3.3–5.5)
Alkaline Phosphatase: 54 U/L (ref 26–84)
BILIRUBIN TOTAL: 0.8 mg/dL (ref 0.20–1.60)
BUN, Bld: 17 mg/dL (ref 7–22)
CALCIUM: 8.9 mg/dL (ref 8.0–10.3)
CO2: 19 meq/L (ref 18–33)
Chloride: 113 mEq/L — ABNORMAL HIGH (ref 98–108)
Creat: 1.6 mg/dl — ABNORMAL HIGH (ref 0.6–1.2)
Glucose, Bld: 101 mg/dL (ref 73–118)
POTASSIUM: 4.7 meq/L (ref 3.3–4.7)
SODIUM: 146 meq/L — AB (ref 128–145)
Total Protein: 6.8 g/dL (ref 6.4–8.1)

## 2015-03-03 LAB — HOLD TUBE, BLOOD BANK - CHCC SATELLITE

## 2015-03-03 LAB — CBC WITH DIFFERENTIAL (CANCER CENTER ONLY)
BASO#: 0.1 10*3/uL (ref 0.0–0.2)
BASO%: 1.5 % (ref 0.0–2.0)
EOS%: 0.1 % (ref 0.0–7.0)
Eosinophils Absolute: 0 10*3/uL (ref 0.0–0.5)
HCT: 13.1 % — ABNORMAL LOW (ref 38.7–49.9)
HEMOGLOBIN: 3.9 g/dL — AB (ref 13.0–17.1)
LYMPH#: 0.8 10*3/uL — ABNORMAL LOW (ref 0.9–3.3)
LYMPH%: 11.7 % — ABNORMAL LOW (ref 14.0–48.0)
MCH: 28.9 pg (ref 28.0–33.4)
MCHC: 29.8 g/dL — AB (ref 32.0–35.9)
MCV: 97 fL (ref 82–98)
MONO#: 1.1 10*3/uL — AB (ref 0.1–0.9)
MONO%: 16.5 % — AB (ref 0.0–13.0)
NEUT#: 4.7 10*3/uL (ref 1.5–6.5)
NEUT%: 70.2 % (ref 40.0–80.0)
Platelets: 295 10*3/uL (ref 145–400)
RBC: 1.35 10*6/uL — ABNORMAL LOW (ref 4.20–5.70)
RDW: 24.9 % — AB (ref 11.1–15.7)
WBC: 6.7 10*3/uL (ref 4.0–10.0)

## 2015-03-03 LAB — IRON AND TIBC CHCC
%SAT: 54 % (ref 20–55)
Iron: 149 ug/dL (ref 42–163)
TIBC: 278 ug/dL (ref 202–409)
UIBC: 129 ug/dL (ref 117–376)

## 2015-03-03 LAB — LACTATE DEHYDROGENASE (CC13): LDH: 370 U/L — ABNORMAL HIGH (ref 125–245)

## 2015-03-03 LAB — RETICULOCYTES (CHCC)
ABS RETIC: 25.4 10*3/uL (ref 19.0–186.0)
RBC.: 1.41 MIL/uL — ABNORMAL LOW (ref 4.22–5.81)
Retic Ct Pct: 1.8 % (ref 0.4–2.3)

## 2015-03-03 LAB — TECHNOLOGIST REVIEW CHCC SATELLITE

## 2015-03-03 LAB — FERRITIN CHCC: Ferritin: 6857 ng/ml — ABNORMAL HIGH (ref 22–316)

## 2015-03-03 LAB — PREPARE RBC (CROSSMATCH)

## 2015-03-03 MED ORDER — FUROSEMIDE 10 MG/ML IJ SOLN
20.0000 mg | Freq: Once | INTRAMUSCULAR | Status: AC
  Administered 2015-03-03: 20 mg via INTRAVENOUS

## 2015-03-03 MED ORDER — ACETAMINOPHEN 325 MG PO TABS
ORAL_TABLET | ORAL | Status: AC
  Filled 2015-03-03: qty 2

## 2015-03-03 MED ORDER — FUROSEMIDE 10 MG/ML IJ SOLN
INTRAMUSCULAR | Status: AC
  Filled 2015-03-03: qty 4

## 2015-03-03 MED ORDER — ACETAMINOPHEN 325 MG PO TABS
650.0000 mg | ORAL_TABLET | Freq: Once | ORAL | Status: AC
  Administered 2015-03-03: 650 mg via ORAL

## 2015-03-03 MED ORDER — SODIUM CHLORIDE 0.9 % IV SOLN
250.0000 mL | Freq: Once | INTRAVENOUS | Status: AC
  Administered 2015-03-03: 250 mL via INTRAVENOUS

## 2015-03-03 MED ORDER — DIPHENHYDRAMINE HCL 25 MG PO CAPS
25.0000 mg | ORAL_CAPSULE | Freq: Once | ORAL | Status: AC
  Administered 2015-03-03: 25 mg via ORAL

## 2015-03-03 MED ORDER — DIPHENHYDRAMINE HCL 25 MG PO CAPS
ORAL_CAPSULE | ORAL | Status: AC
Start: 2015-03-03 — End: 2015-03-03
  Filled 2015-03-03: qty 1

## 2015-03-03 MED ORDER — DIPHENHYDRAMINE HCL 25 MG PO CAPS
ORAL_CAPSULE | ORAL | Status: AC
  Filled 2015-03-03: qty 1

## 2015-03-03 NOTE — Patient Instructions (Signed)

## 2015-03-03 NOTE — Progress Notes (Signed)
Hematology and Oncology Follow Up Visit  Brian Bowen [id number] 11-13-1950 64 y.o. 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  #2 prednisone 5 mg by mouth daily  #3 Jakafi 5mg  po qd  #4 Jadenu 1080mg  po q day     Interim History:  Mr.  Brian Bowen is back for followup. He actually will was was to see me tomorrow. However, he was becoming more fatigued.  He was not having chest pain. He was not having any problems with cough.  There is no abdominal pain. He says that the JAKAFI that he takes really helps with his abdominal symptoms.  He's had no rashes. He's had some leg swelling. Typically, he gets more leg swelling when his hemoglobin is below 5.  He's had no fever.  He's had no headache. No visual issues.  I had talked him when we last saw him about JADENU. We had filled out all the paperwork. We had the prescription sent to his pharmacy. For some reason, he is not on JADENU yet. I think he really needs this.  I looked back in the records, his last ultrasound was 4 years ago. I will set him up for an ultrasound today. I will like to see what his spleen size is.  He is still working. He has a farm of the few thousand acres. He is very appreciative of the rain and the cool weather that we've had lately. He says this is helping his crops grow quite nicely.  Overall, his performance status is ECOG 1.   Medications:  Current outpatient prescriptions:  .  danazol (DANOCRINE) 200 MG capsule, TAKE ONE CAPSULE BY MOUTH 3 TIMES A DAY, Disp: 90 capsule, Rfl: 3 .  Deferasirox (JADENU) 360 MG TABS, Take 1,080 mg by mouth daily., Disp: 90 tablet, Rfl: 5 .  levalbuterol (XOPENEX HFA) 45 MCG/ACT inhaler, Inhale 2 puffs into the lungs every 6 (six) hours as needed for wheezing., Disp: , Rfl:    .  loratadine (CLARITIN) 10 MG tablet, Take 10 mg by mouth daily., Disp: , Rfl:  .  metolazone (ZAROXOLYN) 2.5 MG tablet, Take 1 tablet (2.5 mg total) by mouth as needed., Disp: 30 tablet, Rfl: 4 .  omeprazole (PRILOSEC) 20 MG capsule, Take 20 mg by mouth every morning. , Disp: , Rfl:  .  predniSONE (DELTASONE) 5 MG tablet, Take 1 tablet (5 mg total) by mouth daily with breakfast., Disp: 30 tablet, Rfl: 3 .  ruxolitinib phosphate (JAKAFI) 5 MG tablet, Take 1 tablet (5 mg total) by mouth 2 (two) times daily. Total of 10 mg. (Patient taking differently: Take 5 mg by mouth every morning. ), Disp: 60 tablet, Rfl: 0 .  ULORIC 40 MG tablet, TAKE 1 TABLET BY MOUTH EVERY DAY, Disp: 30 tablet, Rfl: 4 .  ULORIC 40 MG tablet, TAKE 1 TABLET BY MOUTH EVERY DAY, Disp: 30 tablet, Rfl: 4  Allergies: No Known Allergies  Past Medical History, Surgical history, Social history, and Family History were reviewed and updated.  Review of Systems: As above  Physical Exam:  height is 5\' 9"  (1.753 m) and weight is 177 lb (80.287 kg). His oral temperature is 98.1 F (36.7 C). His blood pressure is 137/68 and his pulse is 81. His respiration is 18.   Well-developed and well-nourished white cell and. Head  and neck exam shows no ocular or oral lesions. He has no palpable cervical or supraclavicular lymph nodes. Lungs are clear. Cardiac exam regular rate and rhythm a normal S1 and S2.   He has a 1/6 systolic ejection murmur. Abdomen is soft. He has good bowel sounds. There is no fluid wave. There is no palpable abdominal mass. There is no palpable liver tip. His spleen tip is about 8cm below the left costal margin. Back exam shows no tenderness over the spine ribs or hips. Extremities shows no clubbing or cyanosis. He has 1+ edema in his lower legs. Skin exam shows no rashes, ecchymosis or petechia. Neurological exam shows no focal neurological deficits.  Lab Results  Component Value Date   WBC 6.7 03/03/2015   HGB 3.9*  03/03/2015   HCT 13.1* 03/03/2015   MCV 97 03/03/2015   PLT 295 03/03/2015     Chemistry      Component Value Date/Time   NA 146* 03/03/2015 0841   NA 139 12/31/2014 1317   NA 142 10/01/2013 1256   K 4.7 03/03/2015 0841   K 4.5 12/31/2014 1317   K 4.5 10/01/2013 1256   CL 113* 03/03/2015 0841   CL 106 12/31/2014 1317   CO2 19 03/03/2015 0841   CO2 22 12/31/2014 1317   CO2 27 10/01/2013 1256   BUN 17 03/03/2015 0841   BUN 22 12/31/2014 1317   BUN 18.0 10/01/2013 1256   CREATININE 1.6* 03/03/2015 0841   CREATININE 1.49* 12/31/2014 1317   CREATININE 1.3 10/01/2013 1256      Component Value Date/Time   CALCIUM 8.9 03/03/2015 0841   CALCIUM 8.4 12/31/2014 1317   CALCIUM 9.0 10/01/2013 1256   ALKPHOS 54 03/03/2015 0841   ALKPHOS 56 12/31/2014 1317   ALKPHOS 39* 10/01/2013 1256   AST 20 03/03/2015 0841   AST 15 12/31/2014 1317   AST 17 10/01/2013 1256   ALT 19 03/03/2015 0841   ALT 16 12/31/2014 1317   ALT 27 10/01/2013 1256   BILITOT 0.80 03/03/2015 0841   BILITOT 0.6 12/31/2014 1317   BILITOT 0.58 10/01/2013 1256         Impression and Plan: [name] is 64 year old gentleman with myelofibrosis. Again, he is JAK2  positive. He's on JAKAFI which helps control his systemic symptoms incredibly well.  I am just surprised by the hemoglobin. With him, it really takes a low hemoglobin before he become symptomatic.  Were going to have to transfuse him. I will transfuse him today with 2 units.  I still do not know why he is not started the JADENU. We had this all set up with his pharmacy. We told him to pick this up.  I also want to get an ultrasound of his spleen.  I wonder if [name] might be some that we will have to think about. I know that there is controversy regarding this.  I don't think we had to do another bone marrow test on him. I looked at his blood smear under the microscope and did not see anything that looked like transformation to leukemia. This is  always a concern.  I think we will have to see him back in about 3 weeks' time.  I spent a good 40 minutes with him today.  Volanda Napoleon, MD 8/4/20162:44 PM

## 2015-03-04 ENCOUNTER — Other Ambulatory Visit: Payer: BLUE CROSS/BLUE SHIELD

## 2015-03-04 ENCOUNTER — Ambulatory Visit: Payer: BLUE CROSS/BLUE SHIELD | Admitting: Hematology & Oncology

## 2015-03-04 LAB — TYPE AND SCREEN
ABO/RH(D): O POS
Antibody Screen: NEGATIVE
Unit division: 0
Unit division: 0

## 2015-03-08 ENCOUNTER — Encounter: Payer: Self-pay | Admitting: *Deleted

## 2015-03-08 NOTE — Progress Notes (Signed)
Followed up with [facility] on status of patients Jadenu prescription. It is currently in insurance phase and are working on it.  Gave patient phone number [phone] to follow up.

## 2015-03-14 ENCOUNTER — Telehealth: Payer: Self-pay

## 2015-03-14 ENCOUNTER — Other Ambulatory Visit: Payer: Self-pay

## 2015-03-14 DIAGNOSIS — I4892 Unspecified atrial flutter: Secondary | ICD-10-CM

## 2015-03-14 DIAGNOSIS — D474 Osteomyelofibrosis: Secondary | ICD-10-CM

## 2015-03-14 MED ORDER — DEFERASIROX 360 MG PO TABS: 1080.0000 mg | ORAL_TABLET | Freq: Every day | ORAL | Status: DC

## 2015-03-14 NOTE — Telephone Encounter (Signed)
Received call from pt stating that he was told by [facility] pharmacy that [facility] would not allow for him to have Jadenu filled with them. He was provided with this # but given no further information: [phone].   Upon calling the number, patient member ID was required but automated service identified the # as [place], so new script escribed to [facility]. Pt aware. dph

## 2015-03-16 ENCOUNTER — Telehealth: Payer: Self-pay | Admitting: *Deleted

## 2015-03-16 NOTE — Telephone Encounter (Signed)
Received followup call from patient to let us know status of Jadenu Rx.  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  Unfortunately his insurance company has a cap and he has reached it. His Rx will cost him 11,900.00 per month.  Patient will not pay this understandably.  They have an override process by which they may go through a different department and apply for patient assistance.  Patient should hear by Friday.  He will keep [place] informed.

## 2015-03-28 ENCOUNTER — Telehealth: Payer: Self-pay | Admitting: Family

## 2015-03-28 NOTE — Telephone Encounter (Signed)
Returned pt's call moved appt to 9/2

## 2015-03-29 ENCOUNTER — Ambulatory Visit: Payer: BLUE CROSS/BLUE SHIELD | Admitting: Hematology & Oncology

## 2015-03-29 ENCOUNTER — Other Ambulatory Visit: Payer: BLUE CROSS/BLUE SHIELD

## 2015-03-29 ENCOUNTER — Ambulatory Visit (HOSPITAL_BASED_OUTPATIENT_CLINIC_OR_DEPARTMENT_OTHER): Payer: BLUE CROSS/BLUE SHIELD

## 2015-04-01 ENCOUNTER — Ambulatory Visit (HOSPITAL_BASED_OUTPATIENT_CLINIC_OR_DEPARTMENT_OTHER): Payer: BLUE CROSS/BLUE SHIELD

## 2015-04-01 ENCOUNTER — Telehealth: Payer: Self-pay | Admitting: *Deleted

## 2015-04-01 ENCOUNTER — Other Ambulatory Visit (HOSPITAL_BASED_OUTPATIENT_CLINIC_OR_DEPARTMENT_OTHER): Payer: BLUE CROSS/BLUE SHIELD

## 2015-04-01 ENCOUNTER — Other Ambulatory Visit: Payer: BLUE CROSS/BLUE SHIELD

## 2015-04-01 ENCOUNTER — Ambulatory Visit (HOSPITAL_BASED_OUTPATIENT_CLINIC_OR_DEPARTMENT_OTHER)
Admission: RE | Admit: 2015-04-01 | Discharge: 2015-04-01 | Disposition: A | Discharge status: Home / Self Care | Payer: BLUE CROSS/BLUE SHIELD | Source: Ambulatory Visit | Attending: Hematology & Oncology | Admitting: Hematology & Oncology

## 2015-04-01 ENCOUNTER — Ambulatory Visit (HOSPITAL_BASED_OUTPATIENT_CLINIC_OR_DEPARTMENT_OTHER): Payer: BLUE CROSS/BLUE SHIELD | Admitting: Family

## 2015-04-01 ENCOUNTER — Telehealth: Payer: Self-pay | Admitting: Hematology & Oncology

## 2015-04-01 ENCOUNTER — Encounter: Payer: Self-pay | Admitting: Family

## 2015-04-01 VITALS — BP 140/64 | HR 87 | Temp 98.0°F | Resp 16 | Ht 69.0 in | Wt 174.0 lb

## 2015-04-01 DIAGNOSIS — N281 Cyst of kidney, acquired: Secondary | ICD-10-CM | POA: Diagnosis not present

## 2015-04-01 DIAGNOSIS — D474 Osteomyelofibrosis: Secondary | ICD-10-CM

## 2015-04-01 DIAGNOSIS — R161 Splenomegaly, not elsewhere classified: Secondary | ICD-10-CM | POA: Diagnosis present

## 2015-04-01 DIAGNOSIS — D7581 Myelofibrosis: Secondary | ICD-10-CM

## 2015-04-01 DIAGNOSIS — D649 Anemia, unspecified: Secondary | ICD-10-CM

## 2015-04-01 LAB — CMP (CANCER CENTER ONLY)
ALBUMIN: 3.1 g/dL — AB (ref 3.3–5.5)
ALK PHOS: 61 U/L (ref 26–84)
ALT: 15 U/L (ref 10–47)
AST: 19 U/L (ref 11–38)
BUN: 16 mg/dL (ref 7–22)
CALCIUM: 9 mg/dL (ref 8.0–10.3)
CO2: 22 mEq/L (ref 18–33)
Chloride: 108 mEq/L (ref 98–108)
Creat: 1.3 mg/dl — ABNORMAL HIGH (ref 0.6–1.2)
Glucose, Bld: 88 mg/dL (ref 73–118)
POTASSIUM: 4.7 meq/L (ref 3.3–4.7)
Sodium: 140 mEq/L (ref 128–145)
TOTAL PROTEIN: 6.5 g/dL (ref 6.4–8.1)
Total Bilirubin: 0.8 mg/dl (ref 0.20–1.60)

## 2015-04-01 LAB — CBC WITH DIFFERENTIAL (CANCER CENTER ONLY)
BASO#: 0.1 10*3/uL (ref 0.0–0.2)
BASO%: 2.1 % — AB (ref 0.0–2.0)
EOS%: 0 % (ref 0.0–7.0)
Eosinophils Absolute: 0 10*3/uL (ref 0.0–0.5)
HCT: 14.4 % — ABNORMAL LOW (ref 38.7–49.9)
HEMOGLOBIN: 4.2 g/dL — AB (ref 13.0–17.1)
LYMPH#: 0.8 10*3/uL — AB (ref 0.9–3.3)
LYMPH%: 18.1 % (ref 14.0–48.0)
MCH: 28 pg (ref 28.0–33.4)
MCHC: 29.2 g/dL — AB (ref 32.0–35.9)
MCV: 96 fL (ref 82–98)
MONO#: 0.7 10*3/uL (ref 0.1–0.9)
MONO%: 15 % — ABNORMAL HIGH (ref 0.0–13.0)
NEUT%: 64.8 % (ref 40.0–80.0)
NEUTROS ABS: 2.8 10*3/uL (ref 1.5–6.5)
Platelets: 289 10*3/uL (ref 145–400)
RBC: 1.5 10*6/uL — ABNORMAL LOW (ref 4.20–5.70)
RDW: 22.2 % — AB (ref 11.1–15.7)
WBC: 4.3 10*3/uL (ref 4.0–10.0)

## 2015-04-01 LAB — CHCC SATELLITE - SMEAR

## 2015-04-01 LAB — LACTATE DEHYDROGENASE: LDH: 338 U/L — AB (ref 94–250)

## 2015-04-01 LAB — HOLD TUBE, BLOOD BANK - CHCC SATELLITE

## 2015-04-01 LAB — RETICULOCYTES (CHCC)
ABS RETIC: 20 10*3/uL (ref 19.0–186.0)
RBC.: 1.54 MIL/uL — ABNORMAL LOW (ref 4.22–5.81)
Retic Ct Pct: 1.3 % (ref 0.4–2.3)

## 2015-04-01 LAB — TECHNOLOGIST REVIEW CHCC SATELLITE

## 2015-04-01 NOTE — Telephone Encounter (Signed)
Critical Value HGB 4.2 Dr Marin Olp notified and orders received

## 2015-04-01 NOTE — Telephone Encounter (Signed)
[employer] patient assistance application complete and given to pt today.      JADENU     COPY SCANNED

## 2015-04-01 NOTE — Progress Notes (Signed)
Hematology and Oncology Follow Up Visit  Brian Bowen [id number] Jan 11, 1951 64 y.o. 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 BClingeris here today for follow-up. He is doing well. He states that this is "the best he has felt in a while." He has not had "swisjing" in his ears, dizziness or fatigue. Despite this his Hgb is 4.2 with an MCV of 96.  He had some facial swelling earlier this month. He stopped his prednisone and danazol for one week after that. Once the swelling resolved he started taking 2.5 mg of prednisone at lunch and Danazol 200 mg with breakfast and dinner.  We are still working on getting him so financial assistance for [employer] He states that his insurance company approved it but the copay was $12,000.  He has had no fever, chills, n/v, cough, rash, dizziness, SOB, chest pain, palpitations, abdominal pain, changes in his bowel or bladder habits. He has not noticed and blood in his urine or stool.  His abdominal [place] showed that his splenomegaly is slightly better. No swelling, tenderness, numbness or tingling in his extremities.  He has a good appetite and is eating well. He is also staying hydrated. His weight is stable.   Medications:    Medication List       This list is accurate as of: 04/01/15  2:29 PM.  Always use your most recent med list.               Deferasirox 360 MG Tabs  Commonly known as:  JADENU  Take 1,080 mg by mouth daily.     levalbuterol 45 MCG/ACT inhaler  Commonly known as:  XOPENEX HFA  Inhale 2 puffs into the lungs every 6 (six) hours as needed for wheezing.     loratadine 10 MG tablet  Commonly known as:  CLARITIN  Take 10 mg by mouth daily.     omeprazole 20 MG capsule  Commonly known as:   PRILOSEC  Take 20 mg by mouth every morning.     predniSONE 5 MG tablet  Commonly known as:  DELTASONE  Take 1 tablet (5 mg total) by mouth daily with breakfast.     ruxolitinib phosphate 5 MG tablet  Commonly known as:  JAKAFI  Take 1 tablet (5 mg total) by mouth 2 (two) times daily. Total of 10 mg.     ULORIC 40 MG tablet  Generic drug:  febuxostat  TAKE 1 TABLET BY MOUTH EVERY DAY        Allergies: No Known Allergies  Past Medical History, Surgical history, Social history, and Family History were reviewed and updated.  Review of Systems: All other 10 point review of systems is negative.   Physical Exam:  height is _0  (1.753 m) and weight is 174 lb (78.926 kg). His oral temperature is 98 F (36.7 C). His blood pressure is 140/64 and his pulse is 87. His respiration is 16.   Wt Readings from Last 3 Encounters:  04/01/15 174 lb (78.926 kg)  03/03/15 177 lb (80.287 kg)  01/28/15 178 lb (80.74 kg)    Ocular: Sclerae unicteric, pupils equal, round and reactive to light Ear-nose-throat: Oropharynx clear, dentition fair Lymphatic: No cervical or supraclavicular adenopathy Lungs no rales or rhonchi,  good excursion bilaterally Heart regular rate and rhythm, no murmur appreciated Abd soft, nontender, positive bowel sounds MSK no focal spinal tenderness, no joint edema Neuro: non-focal, well-oriented, appropriate affect Breasts: Deferred  Lab Results  Component Value Date   WBC 4.3 04/01/2015   HGB 4.2* 04/01/2015   HCT 14.4* 04/01/2015   MCV 96 04/01/2015   PLT 289 04/01/2015   Lab Results  Component Value Date   FERRITIN 6,857 Result Confirmed by Automated Dilution.* 03/03/2015   IRON 149 03/03/2015   TIBC 278 03/03/2015   UIBC 129 03/03/2015   IRONPCTSAT 54 03/03/2015   Lab Results  Component Value Date   RETICCTPCT 1.8 03/03/2015   RBC 1.50* 04/01/2015   RETICCTABS 25.4 03/03/2015   No results found for: Nils Pyle [facility]  Results  Component Value Date   IGGSERUM 837 11/10/2007   IGA 157 11/10/2007   IGMSERUM 93 11/10/2007   Lab Results  Component Value Date   TOTALPROTELP 6.8 11/10/2007   ALBUMINELP 64.0 11/10/2007   A1GS 5.6* 11/10/2007   A2GS 8.3 11/10/2007   BETS 6.0 11/10/2007   BETA2SER 4.1 11/10/2007   GAMS 12.0 11/10/2007   MSPIKE * 11/10/2007   SPEI * 11/10/2007     Chemistry      Component Value Date/Time   NA 140 04/01/2015 1112   NA 139 12/31/2014 1317   NA 142 10/01/2013 1256   K 4.7 04/01/2015 1112   K 4.5 12/31/2014 1317   K 4.5 10/01/2013 1256   CL 108 04/01/2015 1112   CL 106 12/31/2014 1317   CO2 22 04/01/2015 1112   CO2 22 12/31/2014 1317   CO2 27 10/01/2013 1256   BUN 16 04/01/2015 1112   BUN 22 12/31/2014 1317   BUN 18.0 10/01/2013 1256   CREATININE 1.3* 04/01/2015 1112   CREATININE 1.49* 12/31/2014 1317   CREATININE 1.3 10/01/2013 1256      Component Value Date/Time   CALCIUM 9.0 04/01/2015 1112   CALCIUM 8.4 12/31/2014 1317   CALCIUM 9.0 10/01/2013 1256   ALKPHOS 61 04/01/2015 1112   ALKPHOS 56 12/31/2014 1317   ALKPHOS 39* 10/01/2013 1256   AST 19 04/01/2015 1112   AST 15 12/31/2014 1317   AST 17 10/01/2013 1256   ALT 15 04/01/2015 1112   ALT 16 12/31/2014 1317   ALT 27 10/01/2013 1256   BILITOT 0.80 04/01/2015 1112   BILITOT 0.6 12/31/2014 1317   BILITOT 0.58 10/01/2013 1256     Impression and Plan: Mr. Orsak is 64 year old gentleman with myelofibrosis JAK2 positive. He is anemic today at 4.2 but is asymptomatic.  We will plan to transfuse him with 2 units next Friday per his request. He has some work and legal appointments during the earlier portion of the week and is unable to make it up here from [place] before then.  We will stop his Danazol since this has not helped him.  He will continue taking his Jakafi and prednisone.  The copay for Jadenu after his insurance approval was astronomical so we are working on finding some assistance for him.    The [place] of his spleen showed a slight improvement in his splenomegaly.  Dr. Marin Olp is considering doing another bone marrow biopsy on him in the near future.  We will plan to see him back in 1 month for labs and follow-up.  He knows to contact us with any questions or concerns. We can certainly see him sooner if need be.   Eliezer Bottom,  NP 9/2/20162:29 PM

## 2015-04-05 ENCOUNTER — Other Ambulatory Visit: Payer: BLUE CROSS/BLUE SHIELD

## 2015-04-05 LAB — IRON AND TIBC CHCC
%SAT: 79 % — AB (ref 20–55)
IRON: 189 ug/dL — AB (ref 42–163)
TIBC: 240 ug/dL (ref 202–409)
UIBC: 51 ug/dL — ABNORMAL LOW (ref 117–376)

## 2015-04-05 LAB — FERRITIN CHCC

## 2015-04-08 ENCOUNTER — Ambulatory Visit (HOSPITAL_COMMUNITY)
Admission: RE | Admit: 2015-04-08 | Discharge: 2015-04-08 | Disposition: A | Discharge status: Home / Self Care | Payer: BLUE CROSS/BLUE SHIELD | Source: Ambulatory Visit | Attending: Hematology & Oncology | Admitting: Hematology & Oncology

## 2015-04-08 ENCOUNTER — Other Ambulatory Visit: Payer: Self-pay | Admitting: Family

## 2015-04-08 ENCOUNTER — Ambulatory Visit (HOSPITAL_BASED_OUTPATIENT_CLINIC_OR_DEPARTMENT_OTHER): Payer: BLUE CROSS/BLUE SHIELD

## 2015-04-08 ENCOUNTER — Other Ambulatory Visit: Payer: BLUE CROSS/BLUE SHIELD

## 2015-04-08 VITALS — BP 133/58 | HR 65 | Temp 98.2°F | Resp 18

## 2015-04-08 DIAGNOSIS — D649 Anemia, unspecified: Secondary | ICD-10-CM | POA: Diagnosis not present

## 2015-04-08 DIAGNOSIS — D474 Osteomyelofibrosis: Secondary | ICD-10-CM | POA: Insufficient documentation

## 2015-04-08 DIAGNOSIS — D7581 Myelofibrosis: Secondary | ICD-10-CM

## 2015-04-08 LAB — PREPARE RBC (CROSSMATCH)

## 2015-04-08 LAB — HOLD TUBE, BLOOD BANK - CHCC SATELLITE

## 2015-04-08 MED ORDER — ACETAMINOPHEN 325 MG PO TABS
650.0000 mg | ORAL_TABLET | Freq: Once | ORAL | Status: AC
  Administered 2015-04-08: 650 mg via ORAL

## 2015-04-08 MED ORDER — SODIUM CHLORIDE 0.9 % IV SOLN
250.0000 mL | Freq: Once | INTRAVENOUS | Status: AC
  Administered 2015-04-08: 250 mL via INTRAVENOUS

## 2015-04-08 MED ORDER — ACETAMINOPHEN 325 MG PO TABS
ORAL_TABLET | ORAL | Status: AC
  Filled 2015-04-08: qty 2

## 2015-04-08 MED ORDER — FUROSEMIDE 10 MG/ML IJ SOLN: 20.0000 mg | Freq: Once | INTRAMUSCULAR | Status: DC

## 2015-04-08 MED ORDER — DIPHENHYDRAMINE HCL 25 MG PO CAPS
ORAL_CAPSULE | ORAL | Status: AC
  Filled 2015-04-08: qty 1

## 2015-04-08 MED ORDER — TERBINAFINE HCL 250 MG PO TABS: 250.0000 mg | ORAL_TABLET | Freq: Every day | ORAL | Status: DC

## 2015-04-08 MED ORDER — DIPHENHYDRAMINE HCL 25 MG PO CAPS
25.0000 mg | ORAL_CAPSULE | Freq: Once | ORAL | Status: AC
  Administered 2015-04-08: 25 mg via ORAL

## 2015-04-08 NOTE — Patient Instructions (Signed)

## 2015-04-09 LAB — TYPE AND SCREEN
ABO/RH(D): O POS
Antibody Screen: NEGATIVE
UNIT DIVISION: 0
Unit division: 0

## 2015-04-13 ENCOUNTER — Other Ambulatory Visit: Payer: Self-pay | Admitting: *Deleted

## 2015-04-13 DIAGNOSIS — I4892 Unspecified atrial flutter: Secondary | ICD-10-CM

## 2015-04-13 DIAGNOSIS — D474 Osteomyelofibrosis: Secondary | ICD-10-CM

## 2015-04-13 MED ORDER — DEFERASIROX 360 MG PO TABS: 1080.0000 mg | ORAL_TABLET | Freq: Every day | ORAL | Status: DC

## 2015-04-18 ENCOUNTER — Telehealth: Payer: Self-pay | Admitting: Hematology & Oncology

## 2015-04-18 NOTE — Telephone Encounter (Addendum)
Faxed JADENU RX to [employer] for APPROVAL.      COPY SCANNED

## 2015-04-19 ENCOUNTER — Telehealth: Payer: Self-pay | Admitting: Hematology & Oncology

## 2015-04-19 NOTE — Telephone Encounter (Signed)
[employer] patient assistance application complete and APPROVED til 07/30/2015    Patient ID: [id number]  JADENU     COPY SCANNED

## 2015-04-22 ENCOUNTER — Telehealth: Payer: Self-pay

## 2015-04-22 NOTE — Telephone Encounter (Signed)
Received call from pt reporting he received his Jadenu today and will begin taking it this pm. dph

## 2015-04-27 ENCOUNTER — Telehealth: Payer: Self-pay | Admitting: *Deleted

## 2015-04-27 DIAGNOSIS — D474 Osteomyelofibrosis: Secondary | ICD-10-CM

## 2015-04-27 DIAGNOSIS — I4892 Unspecified atrial flutter: Secondary | ICD-10-CM

## 2015-04-27 MED ORDER — DEFERASIROX 360 MG PO TABS: 920.0000 mg | ORAL_TABLET | Freq: Every day | ORAL | Status: DC

## 2015-04-27 NOTE — Telephone Encounter (Signed)
Patient has recently begun taking Jadenu and it's causing GI upset with mostly diarrhea. Spoke to Dr Marin Olp who wants patient to stop medication for 3 days, and then re-start only taking 2 pills a day. It's okay for patient to take kaopectate and/or imodium as needed. Patient understands instructions and will call office back if he has any continued problems.

## 2015-05-10 ENCOUNTER — Other Ambulatory Visit: Payer: BLUE CROSS/BLUE SHIELD

## 2015-05-10 ENCOUNTER — Ambulatory Visit: Payer: BLUE CROSS/BLUE SHIELD | Admitting: Hematology & Oncology

## 2015-05-13 ENCOUNTER — Telehealth: Payer: Self-pay | Admitting: Hematology & Oncology

## 2015-05-13 NOTE — Telephone Encounter (Signed)
Patient called spoke with Rn.  Rn transferred patient to me to resch 05/10/15.  Patient resch apt to 05/16/15

## 2015-05-16 ENCOUNTER — Encounter: Payer: Self-pay | Admitting: Hematology & Oncology

## 2015-05-16 ENCOUNTER — Other Ambulatory Visit: Payer: Self-pay | Admitting: *Deleted

## 2015-05-16 ENCOUNTER — Other Ambulatory Visit (HOSPITAL_BASED_OUTPATIENT_CLINIC_OR_DEPARTMENT_OTHER): Payer: BLUE CROSS/BLUE SHIELD

## 2015-05-16 ENCOUNTER — Ambulatory Visit (HOSPITAL_BASED_OUTPATIENT_CLINIC_OR_DEPARTMENT_OTHER): Payer: BLUE CROSS/BLUE SHIELD | Admitting: Hematology & Oncology

## 2015-05-16 ENCOUNTER — Ambulatory Visit (HOSPITAL_BASED_OUTPATIENT_CLINIC_OR_DEPARTMENT_OTHER): Payer: BLUE CROSS/BLUE SHIELD

## 2015-05-16 ENCOUNTER — Telehealth: Payer: Self-pay | Admitting: *Deleted

## 2015-05-16 ENCOUNTER — Ambulatory Visit (HOSPITAL_COMMUNITY)
Admission: RE | Admit: 2015-05-16 | Discharge: 2015-05-16 | Disposition: A | Discharge status: Home / Self Care | Payer: BLUE CROSS/BLUE SHIELD | Source: Ambulatory Visit | Attending: Hematology & Oncology | Admitting: Hematology & Oncology

## 2015-05-16 VITALS — BP 124/44 | HR 73 | Temp 97.9°F | Resp 18

## 2015-05-16 VITALS — BP 121/46 | HR 88 | Temp 97.6°F | Resp 16 | Ht 69.0 in | Wt 166.0 lb

## 2015-05-16 DIAGNOSIS — D474 Osteomyelofibrosis: Secondary | ICD-10-CM | POA: Diagnosis not present

## 2015-05-16 DIAGNOSIS — D7581 Myelofibrosis: Secondary | ICD-10-CM

## 2015-05-16 DIAGNOSIS — Z23 Encounter for immunization: Secondary | ICD-10-CM | POA: Diagnosis not present

## 2015-05-16 DIAGNOSIS — D649 Anemia, unspecified: Secondary | ICD-10-CM

## 2015-05-16 LAB — COMPREHENSIVE METABOLIC PANEL (CC13)
ALT: 42 U/L (ref 0–55)
ANION GAP: 8 meq/L (ref 3–11)
AST: 28 U/L (ref 5–34)
Albumin: 3.8 g/dL (ref 3.5–5.0)
Alkaline Phosphatase: 137 U/L (ref 40–150)
BUN: 23.5 mg/dL (ref 7.0–26.0)
CALCIUM: 8.8 mg/dL (ref 8.4–10.4)
CHLORIDE: 107 meq/L (ref 98–109)
CO2: 25 mEq/L (ref 22–29)
Creatinine: 1.6 mg/dL — ABNORMAL HIGH (ref 0.7–1.3)
EGFR: 44 mL/min/{1.73_m2} — ABNORMAL LOW (ref >90)
Glucose: 107 mg/dl (ref 70–140)
POTASSIUM: 4.2 meq/L (ref 3.5–5.1)
Sodium: 140 mEq/L (ref 136–145)
Total Bilirubin: 0.82 mg/dL (ref 0.20–1.20)
Total Protein: 6.4 g/dL (ref 6.4–8.3)

## 2015-05-16 LAB — FERRITIN CHCC: Ferritin: 2969 ng/mL — ABNORMAL HIGH (ref 22–316)

## 2015-05-16 LAB — IRON AND TIBC CHCC
%SAT: >100 %
Iron: 430 ug/dL — ABNORMAL HIGH (ref 42–163)
TIBC: 285 ug/dL (ref 202–409)
UIBC: <1 ug/dL (ref 117–376)

## 2015-05-16 LAB — CBC WITH DIFFERENTIAL (CANCER CENTER ONLY)
BASO#: 0.1 10*3/uL (ref 0.0–0.2)
BASO%: 1.9 % (ref 0.0–2.0)
EOS%: 0.2 % (ref 0.0–7.0)
Eosinophils Absolute: 0 10*3/uL (ref 0.0–0.5)
HCT: 11.7 % — ABNORMAL LOW (ref 38.7–49.9)
HEMOGLOBIN: 3.6 g/dL — AB (ref 13.0–17.1)
LYMPH#: 0.8 10*3/uL — ABNORMAL LOW (ref 0.9–3.3)
LYMPH%: 14.3 % (ref 14.0–48.0)
MCH: 27.9 pg — AB (ref 28.0–33.4)
MCHC: 30.8 g/dL — AB (ref 32.0–35.9)
MCV: 91 fL (ref 82–98)
MONO#: 0.7 10*3/uL (ref 0.1–0.9)
MONO%: 13.2 % — AB (ref 0.0–13.0)
NEUT%: 70.4 % (ref 40.0–80.0)
NEUTROS ABS: 3.8 10*3/uL (ref 1.5–6.5)
Platelets: 265 10*3/uL (ref 145–400)
RBC: 1.29 10*6/uL — ABNORMAL LOW (ref 4.20–5.70)
RDW: 19.1 % — AB (ref 11.1–15.7)
WBC: 5.3 10*3/uL (ref 4.0–10.0)

## 2015-05-16 LAB — PREPARE RBC (CROSSMATCH)

## 2015-05-16 LAB — HOLD TUBE, BLOOD BANK - CHCC SATELLITE

## 2015-05-16 LAB — RETICULOCYTES (CHCC)
ABS Retic: 16.4 K/uL — ABNORMAL LOW (ref 19.0–186.0)
RBC.: 1.37 MIL/uL — ABNORMAL LOW (ref 4.22–5.81)
Retic Ct Pct: 1.2 % (ref 0.4–2.3)

## 2015-05-16 LAB — TECHNOLOGIST REVIEW CHCC SATELLITE

## 2015-05-16 LAB — CHCC SATELLITE - SMEAR

## 2015-05-16 LAB — LACTATE DEHYDROGENASE: LDH: 324 U/L — ABNORMAL HIGH (ref 94–250)

## 2015-05-16 MED ORDER — INFLUENZA VAC SPLIT QUAD 0.5 ML IM SUSY
0.5000 mL | PREFILLED_SYRINGE | Freq: Once | INTRAMUSCULAR | Status: DC
  Filled 2015-05-16: qty 0.5

## 2015-05-16 MED ORDER — PREDNISONE 5 MG PO TABS: 5.0000 mg | ORAL_TABLET | Freq: Every day | ORAL | Status: DC

## 2015-05-16 MED ORDER — SODIUM CHLORIDE 0.9 % IV SOLN
250.0000 mL | Freq: Once | INTRAVENOUS | Status: AC
  Administered 2015-05-16: 250 mL via INTRAVENOUS

## 2015-05-16 MED ORDER — FUROSEMIDE 10 MG/ML IJ SOLN: 20.0000 mg | Freq: Once | INTRAMUSCULAR | Status: DC

## 2015-05-16 NOTE — Patient Instructions (Signed)

## 2015-05-16 NOTE — Progress Notes (Signed)
Hematology and Oncology Follow Up Visit  SAJID RUPPERT [id number] 06-29-1951 64 y.o. 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  Wacker is back for followup. This is a very busy time of year for him. He is trying to harvest. 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   He does feel tired.  He now is on JADENU. He cannot tolerate the 3 pill a day dose. As such, he is on 2 pills a day. He is doing well with this.  He's had no fever. He has had some pruritus, and his back. This seems to be mostly at nighttime.  His leg swelling seems be a little bit better. He has had some swelling in his legs. They do not seem to be as swollen.  He has not noted any bleeding. He has had no abdominal pain. He has had no nausea or vomiting.  His cardiac status has been okay. He's had no problems of symptomatic atrial fibrillation.  Overall, his performance status is ECOG 1.  Medications:  Current outpatient prescriptions:  Marland Kitchen  Deferasirox (JADENU) 360 MG TABS, Take 920 mg by mouth daily., Disp: 90 tablet, Rfl: 5 .  levalbuterol (XOPENEX HFA) 45 MCG/ACT inhaler, Inhale 2 puffs into the lungs every 6 (six) hours as needed for wheezing., Disp: , Rfl:  .  loratadine (CLARITIN) 10 MG tablet, Take 10 mg by mouth daily., Disp: , Rfl:  .  omeprazole (PRILOSEC) 20 MG capsule, Take 20 mg by mouth every morning. , Disp: , Rfl:  .  predniSONE (DELTASONE) 5 MG tablet, Take 1 tablet (5 mg total) by mouth daily with breakfast., Disp: 30 tablet, Rfl: 3 .  ruxolitinib phosphate (JAKAFI) 5 MG tablet, Take 1 tablet (5 mg total) by mouth 2 (two) times daily. Total of 10 mg. (Patient taking  differently: Take 5 mg by mouth every morning. ), Disp: 60 tablet, Rfl: 0 .  ULORIC 40 MG tablet, TAKE 1 TABLET BY MOUTH EVERY DAY, Disp: 30 tablet, Rfl: 4  Current facility-administered medications:  .  Influenza vac split quadrivalent PF (FLUARIX) injection 0.5 mL, 0.5 mL, Intramuscular, Once, Volanda Napoleon, MD  Allergies: No Known Allergies  Past Medical History, Surgical history, Social history, and Family History were reviewed and updated.  Review of Systems: As above  Physical Exam:  height is 5\' 9"  (1.753 m) and weight is 166 lb (75.297 kg). His oral temperature is 97.6 F (36.4 C). His blood pressure is 121/46 and his pulse is 88. His respiration is 16.   Well-developed and well-nourished white cell and. Head and neck exam shows no ocular or oral lesions. He has no palpable cervical or supraclavicular lymph nodes. Lungs are clear. Cardiac exam regular rate and rhythm a normal S1 and S2.   He has a 1/6 systolic ejection murmur. Abdomen is soft. He has good bowel sounds. There is no fluid wave. There is no palpable abdominal mass. There is no palpable liver tip. His spleen tip is about 7 cm below the left costal margin. Back exam shows no tenderness over the spine ribs or hips. Extremities shows no clubbing or cyanosis. He has  1+ edema in his lower legs. Skin exam shows no rashes, ecchymosis or petechia. Neurological exam shows no focal neurological deficits.  Lab Results  Component Value Date   WBC 5.3 05/16/2015   HGB 3.6* 05/16/2015   HCT 11.7* 05/16/2015   MCV 91 05/16/2015   PLT 265 05/16/2015     Chemistry      Component Value Date/Time   NA 140 04/01/2015 1112   NA 139 12/31/2014 1317   NA 142 10/01/2013 1256   K 4.7 04/01/2015 1112   K 4.5 12/31/2014 1317   K 4.5 10/01/2013 1256   CL 108 04/01/2015 1112   CL 106 12/31/2014 1317   CO2 22 04/01/2015 1112   CO2 22 12/31/2014 1317   CO2 27 10/01/2013 1256   BUN 16 04/01/2015 1112   BUN 22 12/31/2014 1317   BUN  18.0 10/01/2013 1256   CREATININE 1.3* 04/01/2015 1112   CREATININE 1.49* 12/31/2014 1317   CREATININE 1.3 10/01/2013 1256      Component Value Date/Time   CALCIUM 9.0 04/01/2015 1112   CALCIUM 8.4 12/31/2014 1317   CALCIUM 9.0 10/01/2013 1256   ALKPHOS 61 04/01/2015 1112   ALKPHOS 56 12/31/2014 1317   ALKPHOS 39* 10/01/2013 1256   AST 19 04/01/2015 1112   AST 15 12/31/2014 1317   AST 17 10/01/2013 1256   ALT 15 04/01/2015 1112   ALT 16 12/31/2014 1317   ALT 27 10/01/2013 1256   BILITOT 0.80 04/01/2015 1112   BILITOT 0.6 12/31/2014 1317   BILITOT 0.58 10/01/2013 1256         Impression and Plan: Mr. Amor is 64 year old gentleman with myelofibrosis. Again, he is JAK2  positive. He's on JAKAFI which helps control his systemic symptoms incredibly well.  I am just surprised by the hemoglobin. With him, it really takes a low hemoglobin before he become symptomatic.  We're going to have to transfuse him. I will transfuse him with 4 units total. He will get 2 units today and 2 units tomorrow.  I want him to try to increase the [facility] to 5 mg by mouth twice a day. I this would be reasonable.   I am glad that he is now on JADENU. Hopefully, this will help with some of the iron overload.  I don't think we had to do another bone marrow test on him. I looked at his blood smear under the microscope and did not see anything that looked like transformation to leukemia. This is always a concern.  I think we will have to see him back in about 3 weeks' time.  I spent a good 40 minutes with him today.  Volanda Napoleon, MD 10/17/201610:14 AM

## 2015-05-16 NOTE — Telephone Encounter (Signed)
Panic Hgb 3.6 reported today.  Patient will get 2 units today and 2 u tomorrow per d.r ennever

## 2015-05-17 ENCOUNTER — Ambulatory Visit (HOSPITAL_BASED_OUTPATIENT_CLINIC_OR_DEPARTMENT_OTHER): Payer: BLUE CROSS/BLUE SHIELD

## 2015-05-17 ENCOUNTER — Other Ambulatory Visit: Payer: Self-pay | Admitting: *Deleted

## 2015-05-17 VITALS — BP 130/63 | HR 72 | Temp 98.2°F | Resp 18

## 2015-05-17 DIAGNOSIS — D474 Osteomyelofibrosis: Secondary | ICD-10-CM | POA: Diagnosis not present

## 2015-05-17 DIAGNOSIS — D7581 Myelofibrosis: Secondary | ICD-10-CM | POA: Diagnosis not present

## 2015-05-17 DIAGNOSIS — Z23 Encounter for immunization: Secondary | ICD-10-CM

## 2015-05-17 MED ORDER — INFLUENZA VAC SPLIT QUAD 0.5 ML IM SUSY
0.5000 mL | PREFILLED_SYRINGE | Freq: Once | INTRAMUSCULAR | Status: AC
  Administered 2015-05-17: 0.5 mL via INTRAMUSCULAR
  Filled 2015-05-17: qty 0.5

## 2015-05-17 MED ORDER — SODIUM CHLORIDE 0.9 % IV SOLN
250.0000 mL | Freq: Once | INTRAVENOUS | Status: AC
  Administered 2015-05-17: 250 mL via INTRAVENOUS

## 2015-05-17 NOTE — Patient Instructions (Signed)

## 2015-05-18 LAB — TYPE AND SCREEN
ABO/RH(D): O POS
ANTIBODY SCREEN: NEGATIVE
UNIT DIVISION: 0
UNIT DIVISION: 0
Unit division: 0
Unit division: 0

## 2015-06-20 ENCOUNTER — Other Ambulatory Visit: Payer: BLUE CROSS/BLUE SHIELD

## 2015-06-20 ENCOUNTER — Ambulatory Visit: Payer: BLUE CROSS/BLUE SHIELD | Admitting: Hematology & Oncology

## 2015-06-28 ENCOUNTER — Telehealth: Payer: Self-pay | Admitting: *Deleted

## 2015-06-28 ENCOUNTER — Other Ambulatory Visit: Payer: Self-pay | Admitting: *Deleted

## 2015-06-28 DIAGNOSIS — D474 Osteomyelofibrosis: Secondary | ICD-10-CM

## 2015-06-28 NOTE — Telephone Encounter (Signed)
Patient is starting to feel like he may be anemic again. He isn't seen for another 2 weeks and would like to have labs drawn this week. Spoke to Dr Marin Olp and we will bring patient in this week for a CBC. Appointment made and patient aware.

## 2015-07-01 ENCOUNTER — Ambulatory Visit: Payer: BLUE CROSS/BLUE SHIELD

## 2015-07-01 ENCOUNTER — Telehealth: Payer: Self-pay | Admitting: *Deleted

## 2015-07-01 ENCOUNTER — Other Ambulatory Visit (HOSPITAL_BASED_OUTPATIENT_CLINIC_OR_DEPARTMENT_OTHER): Payer: BLUE CROSS/BLUE SHIELD

## 2015-07-01 ENCOUNTER — Ambulatory Visit (HOSPITAL_COMMUNITY)
Admission: RE | Admit: 2015-07-01 | Discharge: 2015-07-01 | Disposition: A | Discharge status: Home / Self Care | Payer: BLUE CROSS/BLUE SHIELD | Source: Ambulatory Visit | Attending: Hematology & Oncology | Admitting: Hematology & Oncology

## 2015-07-01 ENCOUNTER — Other Ambulatory Visit: Payer: Self-pay | Admitting: Family

## 2015-07-01 VITALS — BP 118/51 | HR 72 | Temp 98.2°F | Resp 16

## 2015-07-01 DIAGNOSIS — D474 Osteomyelofibrosis: Secondary | ICD-10-CM | POA: Diagnosis not present

## 2015-07-01 LAB — CBC WITH DIFFERENTIAL (CANCER CENTER ONLY)
BASO#: 0.1 10*3/uL (ref 0.0–0.2)
BASO%: 2.8 % — AB (ref 0.0–2.0)
EOS%: 0 % (ref 0.0–7.0)
Eosinophils Absolute: 0 10*3/uL (ref 0.0–0.5)
HEMATOCRIT: 12.5 % — AB (ref 38.7–49.9)
HEMOGLOBIN: 3.8 g/dL — AB (ref 13.0–17.1)
LYMPH#: 0.7 10*3/uL — AB (ref 0.9–3.3)
LYMPH%: 15.9 % (ref 14.0–48.0)
MCH: 26.4 pg — ABNORMAL LOW (ref 28.0–33.4)
MCHC: 30.4 g/dL — AB (ref 32.0–35.9)
MCV: 87 fL (ref 82–98)
MONO#: 0.6 10*3/uL (ref 0.1–0.9)
MONO%: 13.3 % — AB (ref 0.0–13.0)
NEUT%: 68 % (ref 40.0–80.0)
NEUTROS ABS: 2.9 10*3/uL (ref 1.5–6.5)
Platelets: 186 10*3/uL (ref 145–400)
RBC: 1.44 10*6/uL — ABNORMAL LOW (ref 4.20–5.70)
RDW: 20.3 % — AB (ref 11.1–15.7)
WBC: 4.3 10*3/uL (ref 4.0–10.0)

## 2015-07-01 LAB — HOLD TUBE, BLOOD BANK - CHCC SATELLITE

## 2015-07-01 LAB — TECHNOLOGIST REVIEW CHCC SATELLITE

## 2015-07-01 LAB — PREPARE RBC (CROSSMATCH)

## 2015-07-01 MED ORDER — DIPHENHYDRAMINE HCL 25 MG PO CAPS
25.0000 mg | ORAL_CAPSULE | Freq: Once | ORAL | Status: AC
  Administered 2015-07-01: 25 mg via ORAL

## 2015-07-01 MED ORDER — FUROSEMIDE 10 MG/ML IJ SOLN: 20.0000 mg | Freq: Once | INTRAMUSCULAR | Status: DC

## 2015-07-01 MED ORDER — ACETAMINOPHEN 325 MG PO TABS
650.0000 mg | ORAL_TABLET | Freq: Once | ORAL | Status: AC
  Administered 2015-07-01: 650 mg via ORAL

## 2015-07-01 MED ORDER — DIPHENHYDRAMINE HCL 25 MG PO CAPS
ORAL_CAPSULE | ORAL | Status: AC
  Filled 2015-07-01: qty 1

## 2015-07-01 MED ORDER — ACETAMINOPHEN 325 MG PO TABS
ORAL_TABLET | ORAL | Status: AC
  Filled 2015-07-01: qty 2

## 2015-07-01 MED ORDER — SODIUM CHLORIDE 0.9 % IJ SOLN
3.0000 mL | INTRAMUSCULAR | Status: DC | PRN
  Filled 2015-07-01: qty 10

## 2015-07-01 NOTE — Patient Instructions (Signed)

## 2015-07-01 NOTE — Telephone Encounter (Signed)
Critical Value HGB 3.8 Dr Marin Olp notified. Orders will be placed

## 2015-07-02 LAB — TYPE AND SCREEN
ABO/RH(D): O POS
Antibody Screen: NEGATIVE
UNIT DIVISION: 0
UNIT DIVISION: 0

## 2015-07-08 ENCOUNTER — Telehealth: Payer: Self-pay | Admitting: Hematology & Oncology

## 2015-07-08 NOTE — Telephone Encounter (Signed)
[employer] patient assistance application complete and faxed  today.      JADENU     COPY SCANNED

## 2015-07-12 ENCOUNTER — Other Ambulatory Visit: Payer: Self-pay | Admitting: *Deleted

## 2015-07-12 ENCOUNTER — Encounter: Payer: Self-pay | Admitting: Hematology & Oncology

## 2015-07-12 DIAGNOSIS — D474 Osteomyelofibrosis: Secondary | ICD-10-CM

## 2015-07-13 ENCOUNTER — Telehealth: Payer: Self-pay | Admitting: *Deleted

## 2015-07-13 ENCOUNTER — Other Ambulatory Visit (HOSPITAL_BASED_OUTPATIENT_CLINIC_OR_DEPARTMENT_OTHER): Payer: BLUE CROSS/BLUE SHIELD

## 2015-07-13 ENCOUNTER — Ambulatory Visit (HOSPITAL_BASED_OUTPATIENT_CLINIC_OR_DEPARTMENT_OTHER): Payer: BLUE CROSS/BLUE SHIELD

## 2015-07-13 ENCOUNTER — Encounter: Payer: Self-pay | Admitting: Hematology & Oncology

## 2015-07-13 ENCOUNTER — Other Ambulatory Visit: Payer: Self-pay | Admitting: Family

## 2015-07-13 ENCOUNTER — Ambulatory Visit (HOSPITAL_BASED_OUTPATIENT_CLINIC_OR_DEPARTMENT_OTHER): Payer: BLUE CROSS/BLUE SHIELD | Admitting: Hematology & Oncology

## 2015-07-13 VITALS — BP 128/53 | HR 108 | Temp 97.9°F | Resp 16 | Ht 69.0 in | Wt 176.0 lb

## 2015-07-13 VITALS — BP 133/82 | HR 85 | Temp 98.0°F | Resp 20

## 2015-07-13 DIAGNOSIS — D474 Osteomyelofibrosis: Secondary | ICD-10-CM | POA: Diagnosis not present

## 2015-07-13 LAB — CBC WITH DIFFERENTIAL (CANCER CENTER ONLY)
BASO#: 0.2 10*3/uL (ref 0.0–0.2)
BASO%: 3.5 % — ABNORMAL HIGH (ref 0.0–2.0)
EOS ABS: 0 10*3/uL (ref 0.0–0.5)
EOS%: 0 % (ref 0.0–7.0)
HCT: 16 % — ABNORMAL LOW (ref 38.7–49.9)
HGB: 4.9 g/dL — CL (ref 13.0–17.1)
LYMPH#: 0.8 10*3/uL — ABNORMAL LOW (ref 0.9–3.3)
LYMPH%: 16.9 % (ref 14.0–48.0)
MCH: 27.1 pg — AB (ref 28.0–33.4)
MCHC: 30.6 g/dL — AB (ref 32.0–35.9)
MCV: 88 fL (ref 82–98)
MONO#: 0.6 10*3/uL (ref 0.1–0.9)
MONO%: 12.8 % (ref 0.0–13.0)
NEUT#: 3.1 10*3/uL (ref 1.5–6.5)
NEUT%: 66.8 % (ref 40.0–80.0)
PLATELETS: 192 10*3/uL (ref 145–400)
RBC: 1.81 10*6/uL — AB (ref 4.20–5.70)
RDW: 19.5 % — ABNORMAL HIGH (ref 11.1–15.7)
WBC: 4.6 10*3/uL (ref 4.0–10.0)

## 2015-07-13 LAB — RETICULOCYTES
ABS RETIC: 13.2 10*3/uL — AB (ref 19.0–186.0)
RBC.: 1.88 MIL/uL — AB (ref 4.22–5.81)
Retic Ct Pct: 0.7 % (ref 0.4–2.3)

## 2015-07-13 LAB — COMPREHENSIVE METABOLIC PANEL
ALK PHOS: 149 U/L (ref 40–150)
ALT: 78 U/L — ABNORMAL HIGH (ref 0–55)
ANION GAP: 8 meq/L (ref 3–11)
AST: 41 U/L — ABNORMAL HIGH (ref 5–34)
Albumin: 3.4 g/dL — ABNORMAL LOW (ref 3.5–5.0)
BILIRUBIN TOTAL: 0.72 mg/dL (ref 0.20–1.20)
BUN: 21.7 mg/dL (ref 7.0–26.0)
CO2: 23 meq/L (ref 22–29)
Calcium: 8.6 mg/dL (ref 8.4–10.4)
Chloride: 111 mEq/L — ABNORMAL HIGH (ref 98–109)
Creatinine: 1.4 mg/dL — ABNORMAL HIGH (ref 0.7–1.3)
EGFR: 52 mL/min/{1.73_m2} — AB (ref >90)
Glucose: 87 mg/dl (ref 70–140)
POTASSIUM: 4.4 meq/L (ref 3.5–5.1)
Sodium: 141 mEq/L (ref 136–145)
TOTAL PROTEIN: 6.2 g/dL — AB (ref 6.4–8.3)

## 2015-07-13 LAB — TECHNOLOGIST REVIEW CHCC SATELLITE

## 2015-07-13 LAB — IRON AND TIBC
IRON: 369 ug/dL — AB (ref 42–163)
TIBC: 244 ug/dL (ref 202–409)
UIBC: <1 ug/dL (ref 117–376)

## 2015-07-13 LAB — PREPARE RBC (CROSSMATCH)

## 2015-07-13 LAB — FERRITIN

## 2015-07-13 LAB — CHCC SATELLITE - SMEAR

## 2015-07-13 LAB — HOLD TUBE, BLOOD BANK - CHCC SATELLITE

## 2015-07-13 MED ORDER — SODIUM CHLORIDE 0.9 % IV SOLN
250.0000 mL | Freq: Once | INTRAVENOUS | Status: AC
  Administered 2015-07-13: 250 mL via INTRAVENOUS

## 2015-07-13 MED ORDER — DEFEROXAMINE MESYLATE 2 G IJ SOLR
15.0000 mg/kg/h | Freq: Once | INTRAMUSCULAR | Status: AC
  Administered 2015-07-13: 15 mg/kg/h via INTRAVENOUS
  Filled 2015-07-13: qty 2

## 2015-07-13 MED ORDER — DIPHENHYDRAMINE HCL 25 MG PO CAPS
25.0000 mg | ORAL_CAPSULE | Freq: Once | ORAL | Status: AC
  Administered 2015-07-13: 25 mg via ORAL

## 2015-07-13 MED ORDER — ACETAMINOPHEN 325 MG PO TABS
650.0000 mg | ORAL_TABLET | Freq: Once | ORAL | Status: AC
  Administered 2015-07-13: 650 mg via ORAL

## 2015-07-13 MED ORDER — ACETAMINOPHEN 325 MG PO TABS
ORAL_TABLET | ORAL | Status: AC
  Filled 2015-07-13: qty 2

## 2015-07-13 MED ORDER — DIPHENHYDRAMINE HCL 25 MG PO CAPS
ORAL_CAPSULE | ORAL | Status: AC
  Filled 2015-07-13: qty 1

## 2015-07-13 NOTE — Progress Notes (Signed)
Hematology and Oncology Follow Up Visit  Brian Bowen [id number] 11-15-1950 64 y.o. 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 is back for followup.  He said that he really did not feel much better after his last transfusion. This is probably about 2 weeks ago.    thankfully , he's had no problems with his atrial fibrillation.   He is still working. 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   He's had no fever or sweats.   Surprisingly, he's had no real fatigue. This was some fatigue but not dramatically different.   He's had no problems with the JADENU.  His iron studies are looking a little better. In October, his ferritin was 3000. His iron saturation was still greater than 100%.  Overall, his performance status is ECOG 1.  Medications:  Current outpatient prescriptions:  Marland Kitchen  Deferasirox (JADENU) 360 MG TABS, Take 920 mg by mouth daily., Disp: 90 tablet, Rfl: 5 .  levalbuterol (XOPENEX HFA) 45 MCG/ACT inhaler, Inhale 2 puffs into the lungs every 6 (six) hours as needed for wheezing., Disp: , Rfl:  .  loratadine (CLARITIN) 10 MG tablet, Take 10 mg by mouth daily., Disp: , Rfl:  .  omeprazole (PRILOSEC) 20 MG capsule, Take 20 mg by mouth every morning. , Disp: , Rfl:  .  predniSONE (DELTASONE) 5 MG tablet, Take 1 tablet (5 mg total) by mouth daily with breakfast., Disp: 30 tablet, Rfl: 3 .  ruxolitinib phosphate (JAKAFI) 5 MG tablet, Take 1 tablet (5 mg total) by mouth 2 (two) times daily. Total of 10 mg. (Patient taking differently: Take 5 mg by mouth every morning. ), Disp: 60 tablet, Rfl: 0 .  ULORIC 40 MG tablet, TAKE 1 TABLET BY MOUTH EVERY DAY, Disp: 30 tablet, Rfl:  4  Allergies: No Known Allergies  Past Medical History, Surgical history, Social history, and Family History were reviewed and updated.  Review of Systems: As above  Physical Exam:  height is 5\' 9"  (1.753 m) and weight is 176 lb (79.833 kg). His oral temperature is 97.9 F (36.6 C). His blood pressure is 128/53 and his pulse is 108. His respiration is 16.   Well-developed and well-nourished white cell and. Head and neck exam shows no ocular or oral lesions. He has no palpable cervical or supraclavicular lymph nodes. Lungs are clear. Cardiac exam regular rate and rhythm a normal S1 and S2.   He has a 1/6 systolic ejection murmur. Abdomen is soft. He has good bowel sounds. There is no fluid wave. There is no palpable abdominal mass. There is no palpable liver tip. His spleen tip is about 7 cm below the left costal margin. Back exam shows no tenderness over the spine ribs or hips. Extremities shows no clubbing or cyanosis. He has 1+ edema in his lower legs. Skin exam shows no rashes, ecchymosis or petechia. Neurological exam shows no focal neurological deficits.  Lab Results  Component Value Date   WBC 4.6 07/13/2015   HGB 4.9* 07/13/2015   HCT 16.0* 07/13/2015   MCV 88 07/13/2015   PLT 192 07/13/2015     Chemistry  Component Value Date/Time   NA 140 05/16/2015 0936   NA 140 04/01/2015 1112   NA 139 12/31/2014 1317   K 4.2 05/16/2015 0936   K 4.7 04/01/2015 1112   K 4.5 12/31/2014 1317   CL 108 04/01/2015 1112   CL 106 12/31/2014 1317   CO2 25 05/16/2015 0936   CO2 22 04/01/2015 1112   CO2 22 12/31/2014 1317   BUN 23.5 05/16/2015 0936   BUN 16 04/01/2015 1112   BUN 22 12/31/2014 1317   CREATININE 1.6* 05/16/2015 0936   CREATININE 1.3* 04/01/2015 1112   CREATININE 1.49* 12/31/2014 1317      Component Value Date/Time   CALCIUM 8.8 05/16/2015 0936   CALCIUM 9.0 04/01/2015 1112   CALCIUM 8.4 12/31/2014 1317   ALKPHOS 137 05/16/2015 0936   ALKPHOS 61 04/01/2015 1112    ALKPHOS 56 12/31/2014 1317   AST 28 05/16/2015 0936   AST 19 04/01/2015 1112   AST 15 12/31/2014 1317   ALT 42 05/16/2015 0936   ALT 15 04/01/2015 1112   ALT 16 12/31/2014 1317   BILITOT 0.82 05/16/2015 0936   BILITOT 0.80 04/01/2015 1112   BILITOT 0.6 12/31/2014 1317         Impression and Plan: Brian Bowen is 64 year old gentleman with myelofibrosis. Again, he is JAK2  positive. He's on JAKAFI which helps control his systemic symptoms incredibly well.   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   We will go ahead and transfuse him today with 2 units. I will give a dose of Desferal.   He is on JADENU at home. He is tolerating the 2 pills alternating with 3 pills daily fairly well.   I think that he is going to need another overall biopsy. I will set this up for December 27. I want to make sure that he is not progressing with the myelofibrosis.    I will plan to see him back in one month.   I spent about 25 minutes with him today.       Liliane Bade, MD 12/14/201610:49 AM

## 2015-07-13 NOTE — Telephone Encounter (Signed)
Critical Value HGB 4.9 Dr Marin Olp notified. Orders will be placed.

## 2015-07-13 NOTE — Patient Instructions (Signed)
Blood Transfusion  A blood transfusion is a procedure in which you receive donated blood through an IV tube. You may need a blood transfusion because of illness, surgery, or injury. The blood may come from a donor, or it may be your own blood that you donated previously. The blood given in a transfusion is made up of different types of cells. You may receive:  Red blood cells. These carry oxygen and replace lost blood.  Platelets. These control bleeding.  Plasma. Thishelps blood to clot. If you have hemophilia or another clotting disorder, you may also receive other types of blood products. LET [facility] CARE PROVIDER KNOW ABOUT:  Any allergies you have.  All medicines you are taking, including vitamins, herbs, eye drops, creams, and over-the-counter medicines.  Previous problems you or members of your family have had with the use of anesthetics.  Any blood disorders you have.  Previous surgeries you have had.  Any medical conditions you may have.  Any previous reactions you have had during a blood transfusion.  RISKS AND COMPLICATIONS Generally, this is a safe procedure. However, problems may occur, including:  Having an allergic reaction to something in the donated blood.  Fever. This may be a reaction to the white blood cells in the transfused blood.  Iron overload. This can happen from having many transfusions.  Transfusion-related acute lung injury (TRALI). This is a rare reaction that causes lung damage. The cause is not known.TRALI can occur within hours of a transfusion or several days later.  Sudden (acute) or delayed hemolytic reactions. This happens if your blood does not match the cells in your transfusion. Your body's defense system (immune system) may try to attack the new cells. This complication is rare.  Infection. This is rare. BEFORE THE PROCEDURE  You may have a blood test to determine your blood type. This is necessary to know what kind of blood your  body will accept.  If you are going to have a planned surgery, you may donate your own blood. This may be done in case you need to have a transfusion.  If you have had an allergic reaction to a transfusion in the past, you may be given medicine to help prevent a reaction. Take this medicine only as directed by your health care provider.  You will have your temperature, blood pressure, and pulse monitored before the transfusion. PROCEDURE   An IV will be started in your hand or arm.  The bag of donated blood will be attached to your IV tube and given into your vein.  Your temperature, blood pressure, and pulse will be monitored regularly during the transfusion. This monitoring is done to detect early signs of a transfusion reaction.  If you have any signs or symptoms of a reaction, your transfusion will be stopped and you may be given medicine.  When the transfusion is over, your IV will be removed.  Pressure may be applied to the IV site for a few minutes.  A bandage (dressing) will be applied. The procedure may vary among health care providers and hospitals. AFTER THE PROCEDURE  Your blood pressure, temperature, and pulse will be monitored regularly.   This information is not intended to replace advice given to you by your health care provider. Make sure you discuss any questions you have with your health care provider.   Document Released: 07/13/2000 Document Revised: 08/06/2014 Document Reviewed: 05/26/2014 Elsevier Interactive Patient Education 2016 Elsevier Inc. Deferoxamine injection What is this medicine? DEFEROXAMINE (dee  fer OX a meen) helps to remove excess iron from the body. This may be necessary in patients who have received multiple blood transfusions and people who have ingested too much iron. This medicine may be used for other purposes; ask your health care provider or pharmacist if you have questions. What should I tell my health care provider before I take this  medicine? They need to know if you have any of these conditions: -difficulty passing urine or very little urine -kidney disease -an unusual or allergic reaction to deferoxamine, other medicines, foods, dyes, or preservatives -pregnant or trying to get pregnant -breast-feeding How should I use this medicine? This medicine is for injection into a muscle, slow infusion into a vein, or infusion under the skin. It is usually given by a health care professional in a hospital or clinic setting. Talk to your pediatrician regarding the use of this medicine in children. While this medicine may be prescribed for children as young as 3 years for selected conditions, precautions do apply. Patients over 55 years old may have a stronger reaction and need a smaller dose. Overdosage: If you think you have taken too much of this medicine contact a poison control center or emergency room at once. NOTE: This medicine is only for you. Do not share this medicine with others. What if I miss a dose? This does not apply. What may interact with this medicine? Do not take this medicine with any of the following medications: -preparations containing iron This medicine may also interact with the following medications: -ascorbic acid (vitamin C) -[id number] - used in certain diagnostic tests -prochlorperazine This list may not describe all possible interactions. Give your health care provider a list of all the medicines, herbs, non-prescription drugs, or dietary supplements you use. Also tell them if you smoke, drink alcohol, or use illegal drugs. Some items may interact with your medicine. What should I watch for while using this medicine? Your condition will be monitored carefully while you are receiving this medicine. Visit your doctor or health care professional for regular checks on your progress. Tell your doctor or health care professional as soon as you can if you notice any change in your sight or hearing. You  may get drowsy or dizzy or have problems with your vision. Do not drive, use machinery, or do anything that needs mental alertness until you know how this medicine affects you. Do not stand or sit up quickly, especially if you are an older patient. This reduces the risk of dizzy or fainting spells. While you are receiving this medicine, do not take any vitamin C products unless your doctor or health care professional tells you to. What side effects may I notice from receiving this medicine? Side effects that you should report to your doctor or health care professional as soon as possible: -allergic reactions like skin rash, itching or hives, swelling of the face, lips, or tongue -breathing problems -change in vision -diarrhea -fast heartbeat -feel dizzy, faint -fever -loss of hearing -muscle cramps -pain, swelling where injected -skin flushing, redness -stomach pain Side effects that usually do not require medical attention (report to your doctor or health care professional if they continue or are bothersome): -red coloration of urine This list may not describe all possible side effects. Call your doctor for medical advice about side effects. You may report side effects to FDA at 1-800-FDA-1088. Where should I keep my medicine? This drug is given in a hospital or clinic and will not be stored  at home. NOTE: This sheet is a summary. It may not cover all possible information. If you have questions about this medicine, talk to your doctor, pharmacist, or health care provider.    2016, Elsevier/Gold Standard. (2007-11-06 17:37:06)

## 2015-07-14 DIAGNOSIS — D474 Osteomyelofibrosis: Secondary | ICD-10-CM | POA: Diagnosis not present

## 2015-07-14 LAB — TYPE AND SCREEN
ABO/RH(D): O POS
Antibody Screen: NEGATIVE
UNIT DIVISION: 0
Unit division: 0

## 2015-07-22 ENCOUNTER — Other Ambulatory Visit: Payer: Self-pay | Admitting: Hematology & Oncology

## 2015-07-22 ENCOUNTER — Encounter: Payer: Self-pay | Admitting: Hematology & Oncology

## 2015-07-22 DIAGNOSIS — D474 Osteomyelofibrosis: Secondary | ICD-10-CM

## 2015-07-26 ENCOUNTER — Encounter (HOSPITAL_COMMUNITY): Payer: Self-pay

## 2015-07-26 ENCOUNTER — Ambulatory Visit (HOSPITAL_COMMUNITY)
Admission: RE | Admit: 2015-07-26 | Discharge: 2015-07-26 | Disposition: A | Discharge status: Home / Self Care | Payer: BLUE CROSS/BLUE SHIELD | Source: Ambulatory Visit | Attending: Hematology & Oncology | Admitting: Hematology & Oncology

## 2015-07-26 VITALS — BP 115/61 | HR 78 | Temp 97.6°F | Resp 18 | Ht 69.0 in | Wt 176.0 lb

## 2015-07-26 DIAGNOSIS — D474 Osteomyelofibrosis: Secondary | ICD-10-CM

## 2015-07-26 DIAGNOSIS — D471 Chronic myeloproliferative disease: Secondary | ICD-10-CM | POA: Diagnosis not present

## 2015-07-26 DIAGNOSIS — D72819 Decreased white blood cell count, unspecified: Secondary | ICD-10-CM | POA: Insufficient documentation

## 2015-07-26 DIAGNOSIS — D649 Anemia, unspecified: Secondary | ICD-10-CM | POA: Diagnosis not present

## 2015-07-26 HISTORY — DX: Personal history of other diseases of the musculoskeletal system and connective tissue: Z87.39

## 2015-07-26 HISTORY — DX: Personal history of diseases of the blood and blood-forming organs and certain disorders involving the immune mechanism: Z86.2

## 2015-07-26 LAB — CBC WITH DIFFERENTIAL/PLATELET
Basophils Absolute: 0 10*3/uL (ref 0.0–0.1)
Basophils Relative: 1 %
EOS PCT: 0 %
Eosinophils Absolute: 0 10*3/uL (ref 0.0–0.7)
HCT: 17.9 % — ABNORMAL LOW (ref 39.0–52.0)
HEMOGLOBIN: 5.8 g/dL — AB (ref 13.0–17.0)
LYMPHS ABS: 0.5 10*3/uL — AB (ref 0.7–4.0)
Lymphocytes Relative: 14 %
MCH: 27.5 pg (ref 26.0–34.0)
MCHC: 32.4 g/dL (ref 30.0–36.0)
MCV: 84.8 fL (ref 78.0–100.0)
MONO ABS: 0.5 10*3/uL (ref 0.1–1.0)
MONOS PCT: 15 %
NEUTROS ABS: 2.5 10*3/uL (ref 1.7–7.7)
Neutrophils Relative %: 70 %
Platelets: 148 10*3/uL — ABNORMAL LOW (ref 150–400)
RBC: 2.11 MIL/uL — AB (ref 4.22–5.81)
RDW: 18.1 % — ABNORMAL HIGH (ref 11.5–15.5)
WBC: 3.5 10*3/uL — AB (ref 4.0–10.5)

## 2015-07-26 LAB — BONE MARROW EXAM

## 2015-07-26 MED ORDER — MEPERIDINE HCL 50 MG/ML IJ SOLN
50.0000 mg | Freq: Once | INTRAMUSCULAR | Status: AC
  Administered 2015-07-26: 50 mg via INTRAVENOUS
  Filled 2015-07-26: qty 1

## 2015-07-26 MED ORDER — MIDAZOLAM HCL 5 MG/ML IJ SOLN
10.0000 mg | Freq: Once | INTRAMUSCULAR | Status: AC
  Administered 2015-07-26: 2.5 mg via INTRAVENOUS
  Filled 2015-07-26: qty 2

## 2015-07-26 MED ORDER — SODIUM CHLORIDE 0.9 % IV SOLN
INTRAVENOUS | Status: DC
  Administered 2015-07-26: 500 mL via INTRAVENOUS

## 2015-07-26 MED ORDER — MEPERIDINE HCL 25 MG/ML IJ SOLN
INTRAMUSCULAR | Status: AC | PRN
  Administered 2015-07-26: 50 mg via INTRAVENOUS

## 2015-07-26 NOTE — Sedation Documentation (Signed)
Family updated as to patient's status.

## 2015-07-26 NOTE — Progress Notes (Signed)
CRITICAL VALUE ALERT  Critical value received: hgb  Date of notification:  12.27.2016  Time of notification:  0800  Critical value read back:Yes.    Nurse who received alert:  Santiago Glad Marquita Lias  MD notified (1st page):  Dr Marin Olp here to do procedure, notified, no new orders  Time of first page:  0800  MD notified (2nd page):  Time of second page:  Responding MD:  ennever  Time MD responded:  0800

## 2015-07-26 NOTE — Sedation Documentation (Signed)
Patient denies pain and is resting comfortably.  

## 2015-07-26 NOTE — Sedation Documentation (Signed)
dsg applied by md lt hip/cdi

## 2015-07-26 NOTE — Discharge Instructions (Signed)
Do not drive  For 24 hours Do not go into public places today May resume your regular diet and take home medications as usual May experience small amount of tingling in leg (biopsy side) May take shower and remove bandage in am For any questions or concerns, call dr If bleeding occurs at site, hold pressure x10 minutes  If continues, call doctorBone Marrow Aspiration and Bone Marrow Biopsy, Care After Refer to this sheet in the next few weeks. These instructions provide you with information about caring for yourself after your procedure. Your health care provider may also give you more specific instructions. Your treatment has been planned according to current medical practices, but problems sometimes occur. Call your health care provider if you have any problems or questions after your procedure. WHAT TO EXPECT AFTER THE PROCEDURE After your procedure, it is common to have:  Soreness or tenderness around the puncture site.  Bruising. HOME CARE INSTRUCTIONS  Take medicines only as directed by your health care provider.  Follow your health care provider's instructions about:  Puncture site care.  Bandage (dressing) changes and removal.  Bathe and shower as directed by your health care provider.  Check your puncture site every day for signs of infection. Watch for:  Redness, swelling, or pain.  Fluid, blood, or pus.  Return to your normal activities as directed by your health care provider.  Keep all follow-up visits as directed by your health care provider. This is important. SEEK MEDICAL CARE IF:  You have a fever.  You have uncontrollable bleeding.  You have redness, swelling, or pain at the site of your puncture.  You have fluid, blood, or pus coming from your puncture site.   This information is not intended to replace advice given to you by your health care provider. Make sure you discuss any questions you have with your health care provider.   Document Released:  02/02/2005 Document Revised: 11/30/2014 Document Reviewed: 07/07/2014 Elsevier Interactive Patient Education [employer].

## 2015-07-26 NOTE — Sedation Documentation (Signed)
MD at bedside. 

## 2015-07-26 NOTE — Sedation Documentation (Signed)
dsg cdi 

## 2015-07-26 NOTE — Sedation Documentation (Signed)
Patient is resting comfortably. 

## 2015-07-26 NOTE — Sedation Documentation (Signed)
Medication dose calculated and verified [id number] [place]  Demerol 50mg /versed 2.5 mg

## 2015-07-26 NOTE — Progress Notes (Signed)
CRITICAL VALUE ALERT  Critical value received: hgb  Date of notification:  12 27 2016  Time of notification: 0740  Critical value read back:Yes.    Nurse who received alert:  Santiago Glad Nyzier Boivin  MD notified (1st page): md due here in 15 minutes for procedure  Time of first page: discuss face to face  MD notified (2nd page):  Time of second page:  Responding MD: dr Marin Olp  Time MD responded: awaiting his arrival

## 2015-07-26 NOTE — Procedures (Signed)
Mr. Geisen was brought to the short stay unit at [facility] for a bone marrow biopsy. He had an IV placed peripherally.  His Mallimpati score is 2. His ASA class is 2.  We did the appropriate timeout procedure at 8 AM.  We then placed him onto his right side. The left posterior iliac crest region was prepped and draped in sterile fashion. He received 2.5 mg of Versed IV and 50 mg Demerol IV for sedation.  We infiltrated 5 mL of 1% lidocaine under the skin down to the periosteum.  With a biopsy needle, I obtained a good bone marrow biopsy core. I cannot aspirate out any liquid which is expected given his diagnosis of myelofibrosis.  I then obtained a second smaller core biopsy to send off for cytogenetics.  He tolerated the procedure well. We dressed the procedure site sterilely.  I got his wife and told her about the procedure.  There were no complications.  Lum Keas

## 2015-07-29 ENCOUNTER — Telehealth: Payer: Self-pay | Admitting: *Deleted

## 2015-07-29 ENCOUNTER — Other Ambulatory Visit: Payer: Self-pay | Admitting: *Deleted

## 2015-07-29 DIAGNOSIS — D474 Osteomyelofibrosis: Secondary | ICD-10-CM

## 2015-07-29 NOTE — Telephone Encounter (Signed)
Dr Jonette Eva referring patient to Dr Elroy Channel. All requested medical records faxed and/or routed to New Patient Coordinator at [phone]

## 2015-08-02 ENCOUNTER — Ambulatory Visit (HOSPITAL_COMMUNITY)
Admission: RE | Admit: 2015-08-02 | Discharge: 2015-08-02 | Disposition: A | Discharge status: Home / Self Care | Payer: BLUE CROSS/BLUE SHIELD | Source: Ambulatory Visit | Attending: Hematology & Oncology | Admitting: Hematology & Oncology

## 2015-08-02 DIAGNOSIS — D630 Anemia in neoplastic disease: Secondary | ICD-10-CM | POA: Insufficient documentation

## 2015-08-02 DIAGNOSIS — D474 Osteomyelofibrosis: Secondary | ICD-10-CM | POA: Insufficient documentation

## 2015-08-02 LAB — CHROMOSOME ANALYSIS, BONE MARROW

## 2015-08-03 ENCOUNTER — Telehealth: Payer: Self-pay | Admitting: *Deleted

## 2015-08-03 NOTE — Telephone Encounter (Signed)
Patient has an MD apt and [place] scheduled for 08/17/15. He wants to know if he should keep these appointments since he is now being referred to another physician. Spoke to Dr Marin Olp and he does want patient to keep these appointments. Patient aware.

## 2015-08-15 ENCOUNTER — Telehealth: Payer: Self-pay

## 2015-08-15 ENCOUNTER — Ambulatory Visit (HOSPITAL_BASED_OUTPATIENT_CLINIC_OR_DEPARTMENT_OTHER): Payer: BLUE CROSS/BLUE SHIELD

## 2015-08-15 ENCOUNTER — Other Ambulatory Visit: Payer: Self-pay | Admitting: Oncology

## 2015-08-15 ENCOUNTER — Other Ambulatory Visit: Payer: Self-pay | Admitting: Family

## 2015-08-15 ENCOUNTER — Other Ambulatory Visit (HOSPITAL_BASED_OUTPATIENT_CLINIC_OR_DEPARTMENT_OTHER): Payer: BLUE CROSS/BLUE SHIELD

## 2015-08-15 VITALS — BP 118/43 | HR 67 | Temp 98.4°F | Resp 18

## 2015-08-15 DIAGNOSIS — D474 Osteomyelofibrosis: Secondary | ICD-10-CM

## 2015-08-15 DIAGNOSIS — D630 Anemia in neoplastic disease: Secondary | ICD-10-CM

## 2015-08-15 LAB — CBC WITH DIFFERENTIAL (CANCER CENTER ONLY)
BASO#: 0.1 10*3/uL (ref 0.0–0.2)
BASO%: 2.7 % — ABNORMAL HIGH (ref 0.0–2.0)
EOS ABS: 0 10*3/uL (ref 0.0–0.5)
EOS%: 0.2 % (ref 0.0–7.0)
HEMATOCRIT: 13.6 % — AB (ref 38.7–49.9)
HEMOGLOBIN: 4.2 g/dL — AB (ref 13.0–17.1)
LYMPH#: 0.8 10*3/uL — AB (ref 0.9–3.3)
LYMPH%: 16.7 % (ref 14.0–48.0)
MCH: 27.1 pg — ABNORMAL LOW (ref 28.0–33.4)
MCHC: 30.9 g/dL — ABNORMAL LOW (ref 32.0–35.9)
MCV: 88 fL (ref 82–98)
MONO#: 0.6 10*3/uL (ref 0.1–0.9)
MONO%: 12.3 % (ref 0.0–13.0)
NEUT%: 68.1 % (ref 40.0–80.0)
NEUTROS ABS: 3.3 10*3/uL (ref 1.5–6.5)
Platelets: 212 10*3/uL (ref 145–400)
RBC: 1.55 10*6/uL — AB (ref 4.20–5.70)
RDW: 18.3 % — ABNORMAL HIGH (ref 11.1–15.7)
WBC: 4.9 10*3/uL (ref 4.0–10.0)

## 2015-08-15 LAB — PREPARE RBC (CROSSMATCH)

## 2015-08-15 LAB — TECHNOLOGIST REVIEW CHCC SATELLITE

## 2015-08-15 MED ORDER — DIPHENHYDRAMINE HCL 25 MG PO CAPS
ORAL_CAPSULE | ORAL | Status: AC
  Filled 2015-08-15: qty 1

## 2015-08-15 MED ORDER — FUROSEMIDE 10 MG/ML IJ SOLN: 20.0000 mg | Freq: Once | INTRAMUSCULAR | Status: AC

## 2015-08-15 MED ORDER — DIPHENHYDRAMINE HCL 25 MG PO CAPS
25.0000 mg | ORAL_CAPSULE | Freq: Once | ORAL | Status: AC
  Administered 2015-08-15: 25 mg via ORAL

## 2015-08-15 MED ORDER — SODIUM CHLORIDE 0.9 % IV SOLN
250.0000 mL | Freq: Once | INTRAVENOUS | Status: AC
  Administered 2015-08-15: 250 mL via INTRAVENOUS

## 2015-08-15 MED ORDER — ACETAMINOPHEN 325 MG PO TABS
650.0000 mg | ORAL_TABLET | Freq: Once | ORAL | Status: AC
  Administered 2015-08-15: 650 mg via ORAL

## 2015-08-15 MED ORDER — ACETAMINOPHEN 325 MG PO TABS
ORAL_TABLET | ORAL | Status: AC
  Filled 2015-08-15: qty 2

## 2015-08-15 NOTE — Telephone Encounter (Signed)
Judson Roch, NP aware of critical low hgb. Pt to be given 2 units PRBC today. dph

## 2015-08-15 NOTE — Patient Instructions (Signed)

## 2015-08-16 LAB — TYPE AND SCREEN
ABO/RH(D): O POS
Antibody Screen: NEGATIVE
Unit division: 0
Unit division: 0

## 2015-08-17 ENCOUNTER — Encounter: Payer: Self-pay | Admitting: Hematology & Oncology

## 2015-08-17 ENCOUNTER — Other Ambulatory Visit (HOSPITAL_BASED_OUTPATIENT_CLINIC_OR_DEPARTMENT_OTHER): Payer: BLUE CROSS/BLUE SHIELD

## 2015-08-17 ENCOUNTER — Telehealth: Payer: Self-pay | Admitting: *Deleted

## 2015-08-17 ENCOUNTER — Ambulatory Visit (HOSPITAL_BASED_OUTPATIENT_CLINIC_OR_DEPARTMENT_OTHER)
Admission: RE | Admit: 2015-08-17 | Discharge: 2015-08-17 | Disposition: A | Discharge status: Home / Self Care | Payer: BLUE CROSS/BLUE SHIELD | Source: Ambulatory Visit | Attending: Hematology & Oncology | Admitting: Hematology & Oncology

## 2015-08-17 ENCOUNTER — Ambulatory Visit (HOSPITAL_BASED_OUTPATIENT_CLINIC_OR_DEPARTMENT_OTHER): Payer: BLUE CROSS/BLUE SHIELD | Admitting: Hematology & Oncology

## 2015-08-17 VITALS — BP 138/61 | HR 71 | Temp 98.1°F | Wt 171.0 lb

## 2015-08-17 DIAGNOSIS — K8020 Calculus of gallbladder without cholecystitis without obstruction: Secondary | ICD-10-CM | POA: Insufficient documentation

## 2015-08-17 DIAGNOSIS — R161 Splenomegaly, not elsewhere classified: Secondary | ICD-10-CM | POA: Diagnosis not present

## 2015-08-17 DIAGNOSIS — D474 Osteomyelofibrosis: Secondary | ICD-10-CM | POA: Insufficient documentation

## 2015-08-17 DIAGNOSIS — I4892 Unspecified atrial flutter: Secondary | ICD-10-CM

## 2015-08-17 DIAGNOSIS — D638 Anemia in other chronic diseases classified elsewhere: Secondary | ICD-10-CM | POA: Diagnosis not present

## 2015-08-17 DIAGNOSIS — I480 Paroxysmal atrial fibrillation: Secondary | ICD-10-CM

## 2015-08-17 DIAGNOSIS — D734 Cyst of spleen: Secondary | ICD-10-CM | POA: Insufficient documentation

## 2015-08-17 DIAGNOSIS — N289 Disorder of kidney and ureter, unspecified: Secondary | ICD-10-CM | POA: Diagnosis not present

## 2015-08-17 LAB — CBC WITH DIFFERENTIAL (CANCER CENTER ONLY)
BASO#: 0.2 10*3/uL (ref 0.0–0.2)
BASO%: 3.1 % — AB (ref 0.0–2.0)
EOS%: 0 % (ref 0.0–7.0)
Eosinophils Absolute: 0 10*3/uL (ref 0.0–0.5)
HCT: 19.7 % — ABNORMAL LOW (ref 38.7–49.9)
HEMOGLOBIN: 6.3 g/dL — AB (ref 13.0–17.1)
LYMPH#: 0.8 10*3/uL — AB (ref 0.9–3.3)
LYMPH%: 15.5 % (ref 14.0–48.0)
MCH: 28.6 pg (ref 28.0–33.4)
MCHC: 32 g/dL (ref 32.0–35.9)
MCV: 90 fL (ref 82–98)
MONO#: 0.6 10*3/uL (ref 0.1–0.9)
MONO%: 12.2 % (ref 0.0–13.0)
NEUT%: 69.2 % (ref 40.0–80.0)
NEUTROS ABS: 3.4 10*3/uL (ref 1.5–6.5)
PLATELETS: 191 10*3/uL (ref 145–400)
RBC: 2.2 10*6/uL — AB (ref 4.20–5.70)
RDW: 17.6 % — ABNORMAL HIGH (ref 11.1–15.7)
WBC: 4.9 10*3/uL (ref 4.0–10.0)

## 2015-08-17 LAB — COMPREHENSIVE METABOLIC PANEL
ALBUMIN: 3.7 g/dL (ref 3.5–5.0)
ALK PHOS: 133 U/L (ref 40–150)
ALT: 54 U/L (ref 0–55)
ANION GAP: 6 meq/L (ref 3–11)
AST: 29 U/L (ref 5–34)
BILIRUBIN TOTAL: 0.97 mg/dL (ref 0.20–1.20)
BUN: 21.5 mg/dL (ref 7.0–26.0)
CALCIUM: 8.7 mg/dL (ref 8.4–10.4)
CO2: 24 mEq/L (ref 22–29)
Chloride: 113 mEq/L — ABNORMAL HIGH (ref 98–109)
Creatinine: 1.4 mg/dL — ABNORMAL HIGH (ref 0.7–1.3)
EGFR: 52 mL/min/{1.73_m2} — AB (ref >90)
Glucose: 90 mg/dl (ref 70–140)
POTASSIUM: 4.7 meq/L (ref 3.5–5.1)
Sodium: 143 mEq/L (ref 136–145)
TOTAL PROTEIN: 6.3 g/dL — AB (ref 6.4–8.3)

## 2015-08-17 LAB — IRON AND TIBC
%SAT: >100 % (ref 20–?)
IRON: 310 ug/dL — AB (ref 42–163)
TIBC: 238 ug/dL (ref 202–409)

## 2015-08-17 LAB — LACTATE DEHYDROGENASE: LDH: 362 U/L — ABNORMAL HIGH (ref 125–245)

## 2015-08-17 LAB — FERRITIN: Ferritin: 2248 ng/ml — ABNORMAL HIGH (ref 22–316)

## 2015-08-17 LAB — TECHNOLOGIST REVIEW CHCC SATELLITE

## 2015-08-17 NOTE — Telephone Encounter (Signed)
Critical Value HGB 6.3 Dr Marin Olp notified. No orders received.

## 2015-08-17 NOTE — Progress Notes (Signed)
Hematology and Oncology Follow Up Visit  Brian Bowen [id number] 1950-08-02 65 y.o. 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 back for followup.  We did go ahead and do a bone marrow biopsy on him. This was done on  December 27. The bone marrow report (([id number]  Showed the myelofibrosis. He has more marrow fibrosis. There is no evidence of leukemic transformation.   I do have  Have him on JADENU. This is helping his ferritin. .Marland KitchenHis ferritin is now 2248. His serum iron is 310.   He was transfused a couple weeks ago. 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   [place]  More importantly is [facility]. I had his age, I think that he would still be a candidate for a allogeneic transplant as he still in good shape. I just worry that with the resuts of the recent bone marrow, that he is becoming more more fibrotic and transfusing him will be less and less effective.   Of note, the chromosome studies on his bone marrow were still normal.    he has had no cardiac issues.   His appetite has been [place] well. He's had no issues with bowels or bladder.  Overall, his performance status is ECOG 1.  Medications:  Current outpatient prescriptions:  .Marland Kitchen Deferasirox (JADENU) 360 MG TABS, Take 920 mg by mouth daily., Disp: 90 tablet, Rfl: 5 .  levalbuterol (XOPENEX HFA) 45 MCG/ACT inhaler, Inhale 2 puffs into the lungs every 6 (six) hours as needed for wheezing., Disp: , Rfl:  .  loratadine  (CLARITIN) 10 MG tablet, Take 10 mg by mouth daily., Disp: , Rfl:  .  omeprazole (PRILOSEC) 20 MG capsule, Take 20 mg by mouth every morning. , Disp: , Rfl:  .  predniSONE (DELTASONE) 5 MG tablet, Take 1 tablet (5 mg total) by mouth daily with breakfast., Disp: 30 tablet, Rfl: 3 .  ruxolitinib phosphate (JAKAFI) 5 MG tablet, Take 1 tablet (5 mg total) by mouth 2 (two) times daily. Total of 10 mg. (Patient taking differently: Take 5 mg by mouth every morning. ), Disp: 60 tablet, Rfl: 0 .  ULORIC 40 MG tablet, TAKE 1 TABLET BY MOUTH EVERY DAY, Disp: 30 tablet, Rfl: 4 No current facility-administered medications for this visit.  Facility-Administered Medications Ordered in Other Visits:  .  furosemide (LASIX) injection 20 mg, 20 mg, Intravenous, Once, SEliezer Bottom NP, 20 mg at 08/15/15 1455  Allergies: No Known Allergies  Past Medical History, Surgical history, Social history, and Family History were reviewed and updated.  Review of Systems: As above  Physical Exam:  weight is 171 lb (77.565 kg). His oral temperature is 98.1 F (36.7 C). His blood pressure is 138/61 and his pulse is 71.   Well-developed and well-nourished white cell and. Head and neck exam shows no ocular or oral lesions. He has no palpable  cervical or supraclavicular lymph nodes. Lungs are clear. Cardiac exam regular rate and rhythm a normal S1 and S2.   He has a 1/6 systolic ejection murmur. Abdomen is soft. He has good bowel sounds. There is no fluid wave. There is no palpable abdominal mass. There is no palpable liver tip. His spleen tip is about 7 cm below the left costal margin. Back exam shows no tenderness over the spine ribs or hips. Extremities shows no clubbing or cyanosis. He has 1+ edema in his lower legs. Skin exam shows no rashes, ecchymosis or petechia. Neurological exam shows no focal neurological deficits.  Lab Results  Component Value Date   WBC 4.9 08/17/2015   HGB 6.3* 08/17/2015   HCT 19.7*  08/17/2015   MCV 90 08/17/2015   PLT 191 08/17/2015     Chemistry      Component Value Date/Time   NA 143 08/17/2015 0817   NA 140 04/01/2015 1112   NA 139 12/31/2014 1317   K 4.7 08/17/2015 0817   K 4.7 04/01/2015 1112   K 4.5 12/31/2014 1317   CL 108 04/01/2015 1112   CL 106 12/31/2014 1317   CO2 24 08/17/2015 0817   CO2 22 04/01/2015 1112   CO2 22 12/31/2014 1317   BUN 21.5 08/17/2015 0817   BUN 16 04/01/2015 1112   BUN 22 12/31/2014 1317   CREATININE 1.4* 08/17/2015 0817   CREATININE 1.3* 04/01/2015 1112   CREATININE 1.49* 12/31/2014 1317      Component Value Date/Time   CALCIUM 8.7 08/17/2015 0817   CALCIUM 9.0 04/01/2015 1112   CALCIUM 8.4 12/31/2014 1317   ALKPHOS 133 08/17/2015 0817   ALKPHOS 61 04/01/2015 1112   ALKPHOS 56 12/31/2014 1317   AST 29 08/17/2015 0817   AST 19 04/01/2015 1112   AST 15 12/31/2014 1317   ALT 54 08/17/2015 0817   ALT 15 04/01/2015 1112   ALT 16 12/31/2014 1317   BILITOT 0.97 08/17/2015 0817   BILITOT 0.80 04/01/2015 1112   BILITOT 0.6 12/31/2014 1317         Impression and Plan: [name] is 65 year old gentleman with myelofibrosis. Again, he is JAK2  positive. He's on JAKAFI which helps control his systemic symptoms incredibly well.    I think that the bone marrow certainly does show [place] that his myelofibrosis is worsening. It'll be interesting see what happens at [facility].   The JADENU's help with his iron overload. I think this will also be health for him.   I think we need to get him in for lab work  Only before he goes on his vacation. I want to make sure that his anemia is not currently a problem when he travels.   I probably will see him back in about 6 weeks.     Liliane Bade, MD 1/18/20172:35 PM

## 2015-08-22 ENCOUNTER — Other Ambulatory Visit: Payer: BLUE CROSS/BLUE SHIELD

## 2015-08-24 ENCOUNTER — Encounter (HOSPITAL_COMMUNITY): Payer: Self-pay

## 2015-08-24 ENCOUNTER — Other Ambulatory Visit: Payer: Self-pay | Admitting: *Deleted

## 2015-08-24 DIAGNOSIS — D474 Osteomyelofibrosis: Secondary | ICD-10-CM

## 2015-08-24 MED ORDER — PREDNISONE 5 MG PO TABS: 5.0000 mg | ORAL_TABLET | Freq: Every day | ORAL | Status: AC

## 2015-09-06 ENCOUNTER — Telehealth: Payer: Self-pay | Admitting: *Deleted

## 2015-09-06 ENCOUNTER — Other Ambulatory Visit: Payer: Self-pay | Admitting: *Deleted

## 2015-09-06 DIAGNOSIS — D474 Osteomyelofibrosis: Secondary | ICD-10-CM

## 2015-09-06 NOTE — Telephone Encounter (Signed)
Patient feels like he needs a blood transfusion. Spoke to Dr Marin Olp and we will schedule him for Thursday. Patient aware.

## 2015-09-08 ENCOUNTER — Telehealth: Payer: Self-pay | Admitting: *Deleted

## 2015-09-08 ENCOUNTER — Other Ambulatory Visit (HOSPITAL_BASED_OUTPATIENT_CLINIC_OR_DEPARTMENT_OTHER): Payer: BLUE CROSS/BLUE SHIELD

## 2015-09-08 ENCOUNTER — Ambulatory Visit (HOSPITAL_BASED_OUTPATIENT_CLINIC_OR_DEPARTMENT_OTHER): Payer: BLUE CROSS/BLUE SHIELD

## 2015-09-08 ENCOUNTER — Other Ambulatory Visit: Payer: Self-pay | Admitting: Family

## 2015-09-08 ENCOUNTER — Ambulatory Visit (HOSPITAL_COMMUNITY)
Admission: RE | Admit: 2015-09-08 | Discharge: 2015-09-08 | Disposition: A | Discharge status: Home / Self Care | Payer: BLUE CROSS/BLUE SHIELD | Source: Ambulatory Visit | Attending: Hematology & Oncology | Admitting: Hematology & Oncology

## 2015-09-08 VITALS — BP 125/60 | HR 77 | Temp 98.2°F | Resp 18

## 2015-09-08 DIAGNOSIS — D630 Anemia in neoplastic disease: Secondary | ICD-10-CM

## 2015-09-08 DIAGNOSIS — D474 Osteomyelofibrosis: Secondary | ICD-10-CM

## 2015-09-08 DIAGNOSIS — D638 Anemia in other chronic diseases classified elsewhere: Secondary | ICD-10-CM

## 2015-09-08 LAB — CMP (CANCER CENTER ONLY)
ALBUMIN: 3.3 g/dL (ref 3.3–5.5)
ALT(SGPT): 64 U/L — ABNORMAL HIGH (ref 10–47)
AST: 34 U/L (ref 11–38)
Alkaline Phosphatase: 140 U/L — ABNORMAL HIGH (ref 26–84)
BUN, Bld: 21 mg/dL (ref 7–22)
CALCIUM: 8.9 mg/dL (ref 8.0–10.3)
CO2: 27 meq/L (ref 18–33)
Chloride: 108 mEq/L (ref 98–108)
Creat: 1.6 mg/dl — ABNORMAL HIGH (ref 0.6–1.2)
Glucose, Bld: 88 mg/dL (ref 73–118)
Potassium: 4 mEq/L (ref 3.3–4.7)
Sodium: 139 mEq/L (ref 128–145)
Total Bilirubin: 0.8 mg/dl (ref 0.20–1.60)
Total Protein: 6.2 g/dL — ABNORMAL LOW (ref 6.4–8.1)

## 2015-09-08 LAB — CBC WITH DIFFERENTIAL (CANCER CENTER ONLY)
BASO#: 0.1 10*3/uL (ref 0.0–0.2)
BASO%: 2.8 % — AB (ref 0.0–2.0)
EOS%: 0 % (ref 0.0–7.0)
Eosinophils Absolute: 0 10*3/uL (ref 0.0–0.5)
HEMATOCRIT: 13 % — AB (ref 38.7–49.9)
HGB: 4 g/dL — CL (ref 13.0–17.1)
LYMPH#: 0.8 10*3/uL — AB (ref 0.9–3.3)
LYMPH%: 15.4 % (ref 14.0–48.0)
MCH: 28.4 pg (ref 28.0–33.4)
MCHC: 30.8 g/dL — ABNORMAL LOW (ref 32.0–35.9)
MCV: 92 fL (ref 82–98)
MONO#: 0.7 10*3/uL (ref 0.1–0.9)
MONO%: 14.5 % — ABNORMAL HIGH (ref 0.0–13.0)
NEUT#: 3.4 10*3/uL (ref 1.5–6.5)
NEUT%: 67.3 % (ref 40.0–80.0)
PLATELETS: 205 10*3/uL (ref 145–400)
RBC: 1.41 10*6/uL — AB (ref 4.20–5.70)
RDW: 18 % — AB (ref 11.1–15.7)
WBC: 5.1 10*3/uL (ref 4.0–10.0)

## 2015-09-08 LAB — IRON AND TIBC
%SAT: >100 % (ref 20–?)
IRON: 314 ug/dL — AB (ref 42–163)
TIBC: 236 ug/dL (ref 202–409)
UIBC: <1 ug/dL (ref 117–376)

## 2015-09-08 LAB — PREPARE RBC (CROSSMATCH)

## 2015-09-08 LAB — TECHNOLOGIST REVIEW CHCC SATELLITE

## 2015-09-08 LAB — FERRITIN: Ferritin: 2144 ng/ml — ABNORMAL HIGH (ref 22–316)

## 2015-09-08 MED ORDER — DIPHENHYDRAMINE HCL 25 MG PO CAPS
ORAL_CAPSULE | ORAL | Status: AC
  Filled 2015-09-08: qty 1

## 2015-09-08 MED ORDER — ACETAMINOPHEN 325 MG PO TABS
ORAL_TABLET | ORAL | Status: AC
  Filled 2015-09-08: qty 2

## 2015-09-08 MED ORDER — DIPHENHYDRAMINE HCL 25 MG PO CAPS
25.0000 mg | ORAL_CAPSULE | Freq: Once | ORAL | Status: AC
  Administered 2015-09-08: 25 mg via ORAL

## 2015-09-08 MED ORDER — FUROSEMIDE 10 MG/ML IJ SOLN
20.0000 mg | Freq: Once | INTRAMUSCULAR | Status: AC
  Administered 2015-09-08: 20 mg via INTRAVENOUS

## 2015-09-08 MED ORDER — ACETAMINOPHEN 325 MG PO TABS
650.0000 mg | ORAL_TABLET | Freq: Once | ORAL | Status: AC
  Administered 2015-09-08: 650 mg via ORAL

## 2015-09-08 MED ORDER — SODIUM CHLORIDE 0.9 % IV SOLN
250.0000 mL | Freq: Once | INTRAVENOUS | Status: AC
  Administered 2015-09-08: 250 mL via INTRAVENOUS

## 2015-09-08 MED ORDER — FUROSEMIDE 10 MG/ML IJ SOLN
INTRAMUSCULAR | Status: AC
  Filled 2015-09-08: qty 4

## 2015-09-08 NOTE — Patient Instructions (Signed)

## 2015-09-08 NOTE — Telephone Encounter (Signed)
Critical Value Hgb 4.0 Dr Marin Olp notified. Orders will be placed

## 2015-09-09 LAB — TYPE AND SCREEN
ABO/RH(D): O POS
ANTIBODY SCREEN: NEGATIVE
UNIT DIVISION: 0
UNIT DIVISION: 0

## 2015-09-10 ENCOUNTER — Other Ambulatory Visit: Payer: Self-pay | Admitting: Hematology & Oncology

## 2015-09-20 ENCOUNTER — Encounter: Payer: Self-pay | Admitting: Hematology & Oncology

## 2015-09-22 ENCOUNTER — Telehealth: Payer: Self-pay | Admitting: Hematology & Oncology

## 2015-09-22 NOTE — Telephone Encounter (Signed)
Case # [id number]  September 22 2015 - September 21 2016   JADENU    P: [phone] F: [phone]

## 2015-09-29 ENCOUNTER — Telehealth: Payer: Self-pay | Admitting: *Deleted

## 2015-09-29 ENCOUNTER — Other Ambulatory Visit (HOSPITAL_BASED_OUTPATIENT_CLINIC_OR_DEPARTMENT_OTHER): Payer: BLUE CROSS/BLUE SHIELD

## 2015-09-29 ENCOUNTER — Ambulatory Visit (HOSPITAL_BASED_OUTPATIENT_CLINIC_OR_DEPARTMENT_OTHER): Payer: BLUE CROSS/BLUE SHIELD | Admitting: Hematology & Oncology

## 2015-09-29 VITALS — BP 147/52 | HR 73 | Temp 97.1°F | Wt 172.0 lb

## 2015-09-29 DIAGNOSIS — I4892 Unspecified atrial flutter: Secondary | ICD-10-CM | POA: Diagnosis not present

## 2015-09-29 DIAGNOSIS — D474 Osteomyelofibrosis: Secondary | ICD-10-CM

## 2015-09-29 DIAGNOSIS — D7581 Myelofibrosis: Secondary | ICD-10-CM | POA: Diagnosis not present

## 2015-09-29 DIAGNOSIS — D638 Anemia in other chronic diseases classified elsewhere: Secondary | ICD-10-CM

## 2015-09-29 LAB — CBC WITH DIFFERENTIAL (CANCER CENTER ONLY)
BASO#: 0.1 10*3/uL (ref 0.0–0.2)
BASO%: 2.1 % — ABNORMAL HIGH (ref 0.0–2.0)
EOS ABS: 0 10*3/uL (ref 0.0–0.5)
EOS%: 0.2 % (ref 0.0–7.0)
HEMATOCRIT: 16.2 % — AB (ref 38.7–49.9)
HGB: 5.1 g/dL — CL (ref 13.0–17.1)
LYMPH#: 0.7 10*3/uL — ABNORMAL LOW (ref 0.9–3.3)
LYMPH%: 13.5 % — ABNORMAL LOW (ref 14.0–48.0)
MCH: 28.2 pg (ref 28.0–33.4)
MCHC: 31.5 g/dL — AB (ref 32.0–35.9)
MCV: 90 fL (ref 82–98)
MONO#: 0.7 10*3/uL (ref 0.1–0.9)
MONO%: 13.1 % — ABNORMAL HIGH (ref 0.0–13.0)
NEUT#: 3.6 10*3/uL (ref 1.5–6.5)
NEUT%: 71.1 % (ref 40.0–80.0)
Platelets: 237 10*3/uL (ref 145–400)
RBC: 1.81 10*6/uL — ABNORMAL LOW (ref 4.20–5.70)
RDW: 18.2 % — AB (ref 11.1–15.7)
WBC: 5.1 10*3/uL (ref 4.0–10.0)

## 2015-09-29 LAB — CHCC SATELLITE - SMEAR

## 2015-09-29 NOTE — Telephone Encounter (Signed)
Critical Value HGB 5.1 Dr Marin Olp aware. No orders at this time.

## 2015-09-30 LAB — RETICULOCYTES: Reticulocyte Count: 0.9 % (ref 0.6–2.6)

## 2015-09-30 LAB — TSH: TSH: 1.571 m[IU]/L (ref 0.320–4.118)

## 2015-09-30 NOTE — Progress Notes (Signed)
Hematology and Oncology Follow Up Visit  Brian Bowen [id number] 09/16/1950 65 y.o. 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  Brian Bowen back for followup.  We did go ahead and do a bone marrow biopsy on him. This was done on  December 27. The bone marrow report (([id number]  Showed the myelofibrosis. He has more marrow fibrosis. There is no evidence of leukemic transformation.   I do have  him on JADENU. This is helping his ferritin. .Marland KitchenHis ferritin is now 2144. His total iron is 314.  [place].  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  He's had no increase in fatigue. He's had no bleeding. He's had no leg swelling. The JAKAFI really helps his symptoms.  He has decided to consider the stem cell transplant in April. [facility]. His sister is currently being checked.  He's had no cough. He's had no rashes. He's had no nausea or vomiting.  Overall, his performance status is ECOG 1.  Medications:  Current outpatient prescriptions:  .Marland Kitchen Deferasirox (JADENU) 360 MG TABS, Take 920 mg by mouth daily., Disp: 90 tablet, Rfl: 5 .  levalbuterol (XOPENEX HFA) 45 MCG/ACT inhaler, Inhale 2 puffs into the lungs every 6 (six) hours as needed for wheezing., Disp: , Rfl:  .  loratadine (CLARITIN) 10 MG tablet, Take 10 mg by mouth daily., Disp: , Rfl:  .  omeprazole (PRILOSEC) 20 MG capsule, Take 20 mg by mouth every morning. , Disp: , Rfl:  .  predniSONE (DELTASONE) 5 MG tablet, Take 1 tablet (5 mg total) by mouth daily with breakfast., Disp: 30 tablet, Rfl: 3 .  ruxolitinib  phosphate (JAKAFI) 5 MG tablet, Take 1 tablet (5 mg total) by mouth 2 (two) times daily. Total of 10 mg. (Patient taking differently: Take 5 mg by mouth every morning. ), Disp: 60 tablet, Rfl: 0 .  ULORIC 40 MG tablet, TAKE 1 TABLET BY MOUTH EVERY DAY, Disp: 30 tablet, Rfl: 4 No current facility-administered medications for this visit.  Facility-Administered Medications Ordered in Other Visits:  .  furosemide (LASIX) injection 20 mg, 20 mg, Intravenous, Once, SEliezer Bottom NP, 20 mg at 08/15/15 1455  Allergies: No Known Allergies  Past Medical History, Surgical history, Social history, and Family History were reviewed and updated.  Review of Systems: As above  Physical Exam:  weight is 172 lb (78.019 kg). His oral temperature is 97.1 F (36.2 C). His blood pressure is 147/52 and his pulse is 73.   Well-developed and well-nourished white cell and. Head and neck exam shows no ocular or oral lesions. He has no palpable cervical or supraclavicular lymph nodes. Lungs are clear. Cardiac exam regular rate and rhythm a normal S1 and S2.   He has a 1/6 systolic ejection murmur. Abdomen is soft. He has good bowel sounds. There is no fluid wave. There is no palpable abdominal mass. There is no palpable liver tip. His spleen tip is about 7 cm below the left costal margin. Back exam shows  no tenderness over the spine ribs or hips. Extremities shows no clubbing or cyanosis. He has 1+ edema in his lower legs. Skin exam shows no rashes, ecchymosis or petechia. Neurological exam shows no focal neurological deficits.  Lab Results  Component Value Date   WBC 5.1 09/29/2015   HGB 5.1* 09/29/2015   HCT 16.2* 09/29/2015   MCV 90 09/29/2015   PLT 237 09/29/2015     Chemistry      Component Value Date/Time   NA 139 09/08/2015 0756   NA 143 08/17/2015 0817   NA 139 12/31/2014 1317   K 4.0 09/08/2015 0756   K 4.7 08/17/2015 0817   K 4.5 12/31/2014 1317   CL 108 09/08/2015 0756   CL 106 12/31/2014  1317   CO2 27 09/08/2015 0756   CO2 24 08/17/2015 0817   CO2 22 12/31/2014 1317   BUN 21 09/08/2015 0756   BUN 21.5 08/17/2015 0817   BUN 22 12/31/2014 1317   CREATININE 1.6* 09/08/2015 0756   CREATININE 1.4* 08/17/2015 0817   CREATININE 1.49* 12/31/2014 1317      Component Value Date/Time   CALCIUM 8.9 09/08/2015 0756   CALCIUM 8.7 08/17/2015 0817   CALCIUM 8.4 12/31/2014 1317   ALKPHOS 140* 09/08/2015 0756   ALKPHOS 133 08/17/2015 0817   ALKPHOS 56 12/31/2014 1317   AST 34 09/08/2015 0756   AST 29 08/17/2015 0817   AST 15 12/31/2014 1317   ALT 64* 09/08/2015 0756   ALT 54 08/17/2015 0817   ALT 16 12/31/2014 1317   BILITOT 0.80 09/08/2015 0756   BILITOT 0.97 08/17/2015 0817   BILITOT 0.6 12/31/2014 1317         Impression and Plan: Mr. Schachter is 65 year old gentleman with myelofibrosis. Again, he is JAK2  positive. He's on JAKAFI which helps control his systemic symptoms incredibly well.   I'm glad that he will consider the transplant in April. I really think this is the best way to go for him. I think the sooner that he has a transplant, the better he will respond and that his blood counts will hopefully normalize.  He will let us know when he needs to be transfused. He is very good about letting us know. He has absolutely no symptoms right now.   I'll plan to see him back in a month. By then, we should know more about the timing for his transplant. He apparently has matches in the donor program. His sister is being checked.    Liliane Bade, MD 3/3/20177:36 AM

## 2015-10-04 ENCOUNTER — Other Ambulatory Visit: Payer: Self-pay | Admitting: Nurse Practitioner

## 2015-10-04 DIAGNOSIS — D474 Osteomyelofibrosis: Secondary | ICD-10-CM

## 2015-10-05 ENCOUNTER — Other Ambulatory Visit: Payer: Self-pay | Admitting: *Deleted

## 2015-10-05 ENCOUNTER — Telehealth: Payer: Self-pay | Admitting: Hematology & Oncology

## 2015-10-05 DIAGNOSIS — I4892 Unspecified atrial flutter: Secondary | ICD-10-CM

## 2015-10-05 DIAGNOSIS — D474 Osteomyelofibrosis: Secondary | ICD-10-CM

## 2015-10-05 MED ORDER — DEFERASIROX 360 MG PO TABS: 920.0000 mg | ORAL_TABLET | Freq: Every day | ORAL | Status: DC

## 2015-10-05 NOTE — Telephone Encounter (Signed)
°  Pt has been APPROVED for the the co-pay assistance program above. His out of pocket will be apprx $10 for the JADENU.  Offer: [name] Co-Pay Card: Eligible patients may pay no more than $25 for each of your prescriptions; for additional information contact the program at [phone].  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 Main Office Address: [facility] [address] [address] Toll-Free Patient Information: [phone] Thank you for applying with [employer]. 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 Present the information below to your pharmacy to process payment. If you are receiving your treatment directly from your physician, you will receive a letter in the mail to present to the office. If treatment is needed prior to receiving the letter, please have the office call [phone] to obtain payment. This program may be used at your local pharmacy. You will need to provide the data below to your pharmacist to allow them to use your program benefit. Program Benefit Information NameClarence Bowen  CSN [id number]  Term 10/07/2015 - 07/29/2016  Available Balance $6,000.00 **  Patient Responsibility $5.00  Pharmacy Benefit Information Member Id [id number]  BIN [id number]  PCN [id number]  GRP [id number]  Patient Responsibility $5.00  ** Available balance is based on the maximum amount of assistance the patient can receive for this program with full approval. Additional funding may be available through a direct appeal, but is not guaranteed  [employer] is not helping him out anymore because he has insurance. The program must have changed, as he was getting assistance with them in years past. P: [phone] Pt is aware of this.

## 2015-10-07 ENCOUNTER — Ambulatory Visit (HOSPITAL_COMMUNITY)
Admission: RE | Admit: 2015-10-07 | Discharge: 2015-10-07 | Disposition: A | Discharge status: Home / Self Care | Payer: BLUE CROSS/BLUE SHIELD | Source: Ambulatory Visit | Attending: Hematology & Oncology | Admitting: Hematology & Oncology

## 2015-10-07 ENCOUNTER — Other Ambulatory Visit: Payer: Self-pay | Admitting: *Deleted

## 2015-10-07 ENCOUNTER — Other Ambulatory Visit (HOSPITAL_BASED_OUTPATIENT_CLINIC_OR_DEPARTMENT_OTHER): Payer: BLUE CROSS/BLUE SHIELD

## 2015-10-07 ENCOUNTER — Telehealth: Payer: Self-pay | Admitting: *Deleted

## 2015-10-07 ENCOUNTER — Other Ambulatory Visit: Payer: Self-pay | Admitting: Nurse Practitioner

## 2015-10-07 ENCOUNTER — Ambulatory Visit (HOSPITAL_BASED_OUTPATIENT_CLINIC_OR_DEPARTMENT_OTHER): Payer: BLUE CROSS/BLUE SHIELD

## 2015-10-07 VITALS — BP 124/52 | HR 55 | Temp 98.2°F | Resp 18

## 2015-10-07 DIAGNOSIS — D474 Osteomyelofibrosis: Secondary | ICD-10-CM

## 2015-10-07 DIAGNOSIS — D638 Anemia in other chronic diseases classified elsewhere: Secondary | ICD-10-CM

## 2015-10-07 DIAGNOSIS — I4892 Unspecified atrial flutter: Secondary | ICD-10-CM

## 2015-10-07 LAB — CBC WITH DIFFERENTIAL (CANCER CENTER ONLY)
BASO#: 0.2 10*3/uL (ref 0.0–0.2)
BASO%: 3 % — AB (ref 0.0–2.0)
EOS ABS: 0 10*3/uL (ref 0.0–0.5)
EOS%: 0.2 % (ref 0.0–7.0)
HEMATOCRIT: 12.9 % — AB (ref 38.7–49.9)
HGB: 4.1 g/dL — CL (ref 13.0–17.1)
LYMPH#: 0.8 10*3/uL — AB (ref 0.9–3.3)
LYMPH%: 15.3 % (ref 14.0–48.0)
MCH: 27.9 pg — AB (ref 28.0–33.4)
MCHC: 31.8 g/dL — AB (ref 32.0–35.9)
MCV: 88 fL (ref 82–98)
MONO#: 0.8 10*3/uL (ref 0.1–0.9)
MONO%: 15.5 % — ABNORMAL HIGH (ref 0.0–13.0)
NEUT#: 3.3 10*3/uL (ref 1.5–6.5)
NEUT%: 66 % (ref 40.0–80.0)
Platelets: 193 10*3/uL (ref 145–400)
RBC: 1.47 10*6/uL — ABNORMAL LOW (ref 4.20–5.70)
RDW: 18.8 % — AB (ref 11.1–15.7)
WBC: 5 10*3/uL (ref 4.0–10.0)

## 2015-10-07 LAB — PREPARE RBC (CROSSMATCH)

## 2015-10-07 LAB — TECHNOLOGIST REVIEW CHCC SATELLITE

## 2015-10-07 MED ORDER — DEFERASIROX 360 MG PO TABS: 920.0000 mg | ORAL_TABLET | Freq: Every day | ORAL | Status: DC

## 2015-10-07 MED ORDER — ACETAMINOPHEN 325 MG PO TABS
650.0000 mg | ORAL_TABLET | Freq: Once | ORAL | Status: AC
  Administered 2015-10-07: 650 mg via ORAL

## 2015-10-07 MED ORDER — ACETAMINOPHEN 325 MG PO TABS
ORAL_TABLET | ORAL | Status: AC
  Filled 2015-10-07: qty 2

## 2015-10-07 MED ORDER — DIPHENHYDRAMINE HCL 25 MG PO CAPS
ORAL_CAPSULE | ORAL | Status: AC
  Filled 2015-10-07: qty 1

## 2015-10-07 MED ORDER — FUROSEMIDE 10 MG/ML IJ SOLN: 20.0000 mg | Freq: Once | INTRAMUSCULAR | Status: DC

## 2015-10-07 MED ORDER — DIPHENHYDRAMINE HCL 25 MG PO CAPS
25.0000 mg | ORAL_CAPSULE | Freq: Once | ORAL | Status: AC
  Administered 2015-10-07: 25 mg via ORAL

## 2015-10-07 NOTE — Telephone Encounter (Signed)
Critical Value HGB 4.1 Dr Marin Olp aware. Patient is here for blood transfusion.

## 2015-10-07 NOTE — Patient Instructions (Signed)

## 2015-10-10 ENCOUNTER — Other Ambulatory Visit: Payer: Self-pay | Admitting: *Deleted

## 2015-10-10 ENCOUNTER — Encounter: Payer: Self-pay | Admitting: Hematology & Oncology

## 2015-10-10 DIAGNOSIS — I4892 Unspecified atrial flutter: Secondary | ICD-10-CM

## 2015-10-10 DIAGNOSIS — D474 Osteomyelofibrosis: Secondary | ICD-10-CM

## 2015-10-10 LAB — TYPE AND SCREEN
ABO/RH(D): O POS
ANTIBODY SCREEN: NEGATIVE
Unit division: 0
Unit division: 0

## 2015-10-10 MED ORDER — DEFERASIROX 360 MG PO TABS: 720.0000 mg | ORAL_TABLET | Freq: Every day | ORAL | Status: AC

## 2015-10-25 ENCOUNTER — Other Ambulatory Visit: Payer: Self-pay | Admitting: *Deleted

## 2015-10-25 DIAGNOSIS — D474 Osteomyelofibrosis: Secondary | ICD-10-CM

## 2015-10-27 ENCOUNTER — Other Ambulatory Visit: Payer: Self-pay | Admitting: Family

## 2015-10-27 DIAGNOSIS — D474 Osteomyelofibrosis: Secondary | ICD-10-CM

## 2015-10-28 ENCOUNTER — Other Ambulatory Visit (HOSPITAL_BASED_OUTPATIENT_CLINIC_OR_DEPARTMENT_OTHER): Payer: BLUE CROSS/BLUE SHIELD

## 2015-10-28 ENCOUNTER — Telehealth: Payer: Self-pay | Admitting: *Deleted

## 2015-10-28 ENCOUNTER — Ambulatory Visit (HOSPITAL_BASED_OUTPATIENT_CLINIC_OR_DEPARTMENT_OTHER): Payer: BLUE CROSS/BLUE SHIELD

## 2015-10-28 VITALS — BP 136/55 | HR 64 | Temp 98.1°F | Resp 18

## 2015-10-28 DIAGNOSIS — D638 Anemia in other chronic diseases classified elsewhere: Secondary | ICD-10-CM

## 2015-10-28 DIAGNOSIS — D474 Osteomyelofibrosis: Secondary | ICD-10-CM

## 2015-10-28 LAB — CBC WITH DIFFERENTIAL (CANCER CENTER ONLY)
BASO#: 0.1 10*3/uL (ref 0.0–0.2)
BASO%: 3.1 % — AB (ref 0.0–2.0)
EOS%: 0 % (ref 0.0–7.0)
Eosinophils Absolute: 0 10*3/uL (ref 0.0–0.5)
HEMATOCRIT: 13.8 % — AB (ref 38.7–49.9)
HEMOGLOBIN: 4.5 g/dL — AB (ref 13.0–17.1)
LYMPH#: 0.8 10*3/uL — ABNORMAL LOW (ref 0.9–3.3)
LYMPH%: 17 % (ref 14.0–48.0)
MCH: 29.2 pg (ref 28.0–33.4)
MCHC: 32.6 g/dL (ref 32.0–35.9)
MCV: 90 fL (ref 82–98)
MONO#: 0.6 10*3/uL (ref 0.1–0.9)
MONO%: 14.1 % — ABNORMAL HIGH (ref 0.0–13.0)
NEUT#: 3 10*3/uL (ref 1.5–6.5)
NEUT%: 65.8 % (ref 40.0–80.0)
Platelets: 184 10*3/uL (ref 145–400)
RBC: 1.54 10*6/uL — ABNORMAL LOW (ref 4.20–5.70)
RDW: 18.2 % — AB (ref 11.1–15.7)
WBC: 4.5 10*3/uL (ref 4.0–10.0)

## 2015-10-28 LAB — PREPARE RBC (CROSSMATCH)

## 2015-10-28 LAB — TECHNOLOGIST REVIEW CHCC SATELLITE: Tech Review: 3

## 2015-10-28 MED ORDER — FUROSEMIDE 10 MG/ML IJ SOLN: 20.0000 mg | Freq: Once | INTRAMUSCULAR | Status: DC

## 2015-10-28 MED ORDER — DIPHENHYDRAMINE HCL 25 MG PO CAPS
ORAL_CAPSULE | ORAL | Status: AC
  Filled 2015-10-28: qty 1

## 2015-10-28 MED ORDER — ACETAMINOPHEN 325 MG PO TABS
650.0000 mg | ORAL_TABLET | Freq: Once | ORAL | Status: AC
  Administered 2015-10-28: 650 mg via ORAL

## 2015-10-28 MED ORDER — SODIUM CHLORIDE 0.9 % IV SOLN
250.0000 mL | Freq: Once | INTRAVENOUS | Status: AC
  Administered 2015-10-28: 250 mL via INTRAVENOUS

## 2015-10-28 MED ORDER — DIPHENHYDRAMINE HCL 25 MG PO CAPS
25.0000 mg | ORAL_CAPSULE | Freq: Once | ORAL | Status: AC
  Administered 2015-10-28: 25 mg via ORAL

## 2015-10-28 MED ORDER — ACETAMINOPHEN 325 MG PO TABS
ORAL_TABLET | ORAL | Status: AC
  Filled 2015-10-28: qty 2

## 2015-10-28 NOTE — Patient Instructions (Signed)

## 2015-10-28 NOTE — Telephone Encounter (Signed)
Critical Value HGB 4.5 Dr Marin Olp notified. Patient already scheduled to receive 2 units PRBC. No new orders

## 2015-10-31 ENCOUNTER — Telehealth: Payer: Self-pay | Admitting: *Deleted

## 2015-10-31 ENCOUNTER — Encounter: Payer: Self-pay | Admitting: Hematology & Oncology

## 2015-10-31 LAB — TYPE AND SCREEN
ABO/RH(D): O POS
ANTIBODY SCREEN: NEGATIVE
UNIT DIVISION: 0
UNIT DIVISION: 0

## 2015-10-31 NOTE — Telephone Encounter (Signed)
Patient calling to update this office on plans for transplant at [facility]   They have found a donor and have moved forward with making transplant plans. He has a comprehensive appointment this week and will cancel his appointment for Wednesday - Dr Marin Olp is aware. He also needs a bone marrow biopsy between April 20th and May 1st and would like Dr Marin Olp to be the one to perform this. This message has been given to Dr Marin Olp and he is in agreement.

## 2015-11-01 IMAGING — CT CT ANGIO CHEST
2 of 7 series · 18 of 36 positions shown · IV contrast (omnipaque)
Comparison: DG CHEST 1V PORT dated 09/10/2013

CLINICAL DATA: Elevated heart rate. New onset atrial flutter.
Pulmonary embolism.

EXAM:
CT ANGIOGRAPHY CHEST WITH CONTRAST
TECHNIQUE: Multidetector CT imaging of the chest was performed using the
standard protocol during bolus administration of intravenous
contrast. Multiplanar CT image reconstructions and MIPs were
obtained to evaluate the vascular anatomy.
CONTRAST:  100mL OMNIPAQUE IOHEXOL 350 MG/ML SOLN

[Series 5: pe thins · axial · 0.76mm/px · z∈[+87,+350]mm · 17 of 297 slices shown]
[im 17/297  lung]
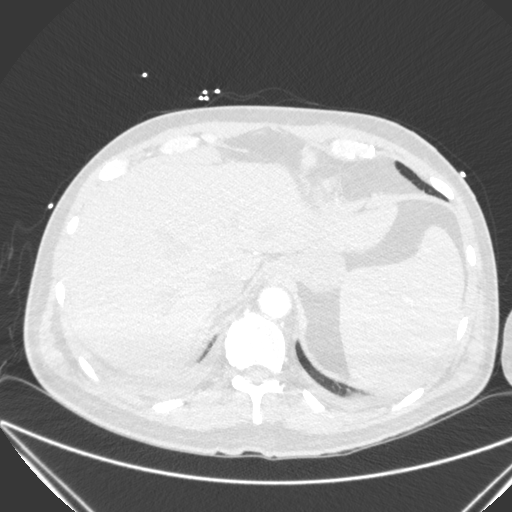
[im 33/297  mediastinal]
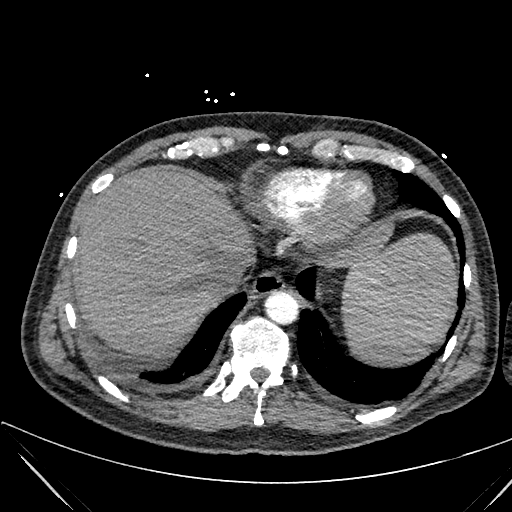
[im 50/297  lung]
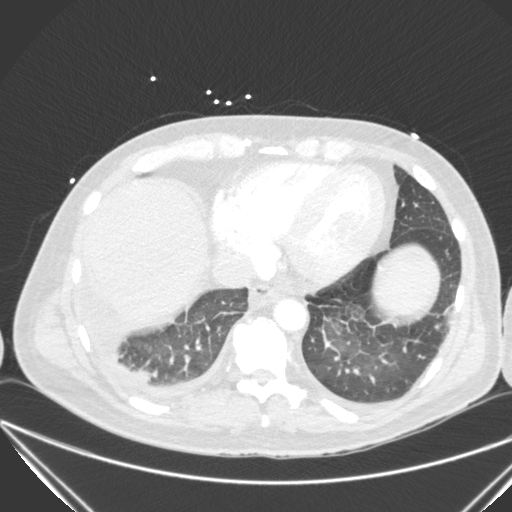
[im 66/297  mediastinal]
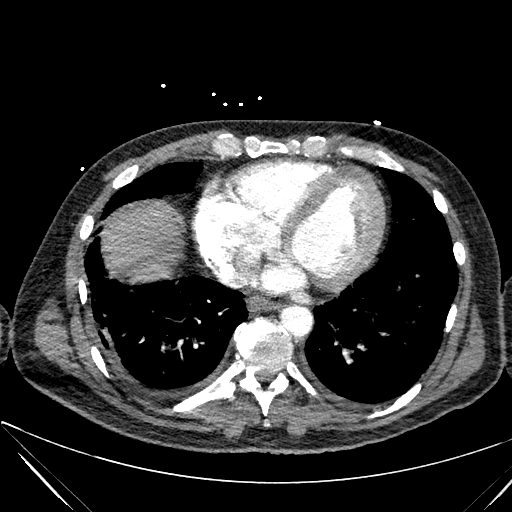
[im 83/297  lung]
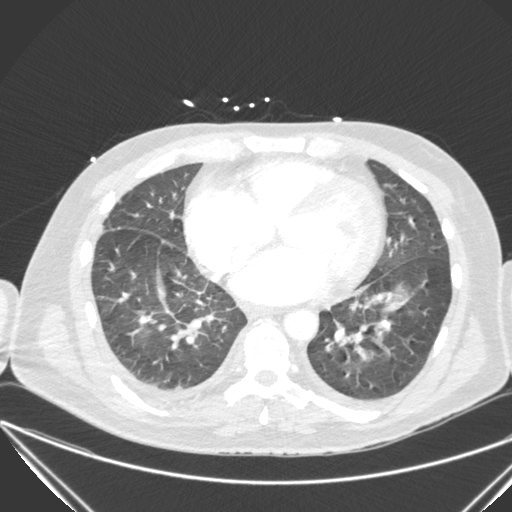
[im 99/297  mediastinal]
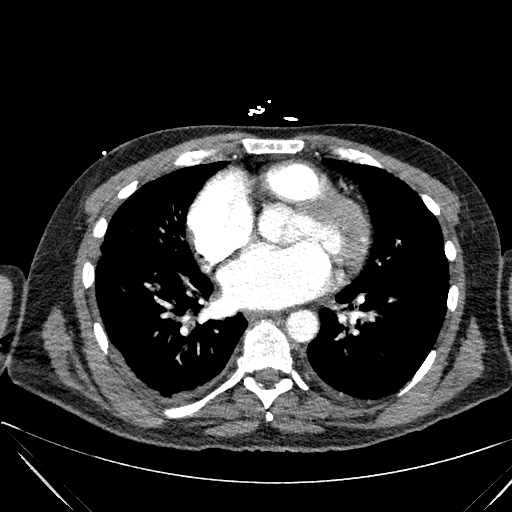
[im 116/297  lung]
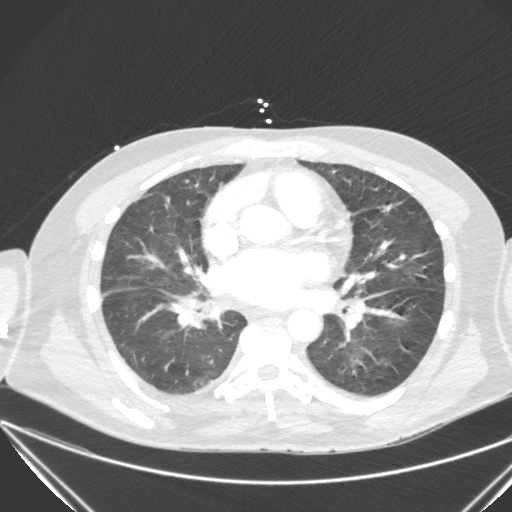
[im 132/297  mediastinal]
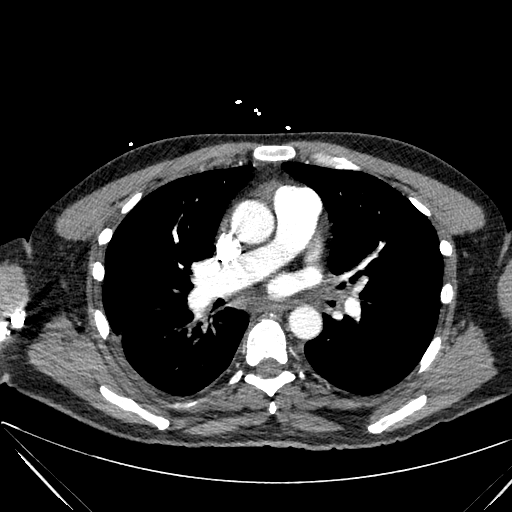
[im 149/297  lung]
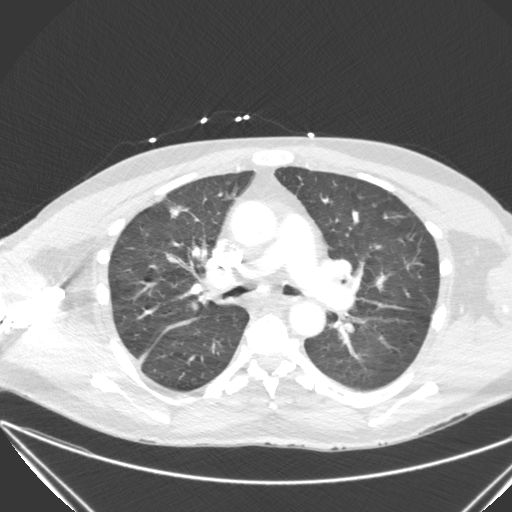
[im 165/297  mediastinal]
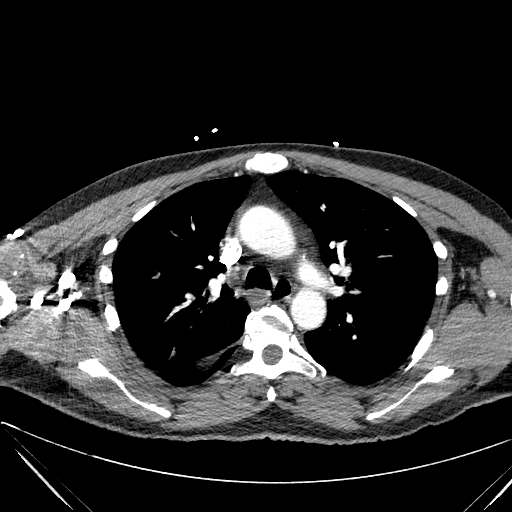
[im 181/297  lung]
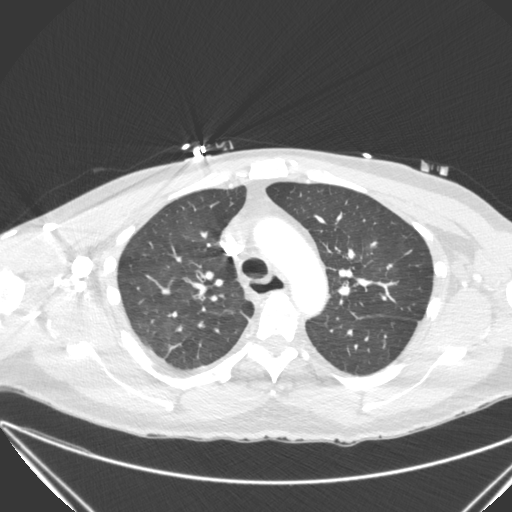
[im 198/297  mediastinal]
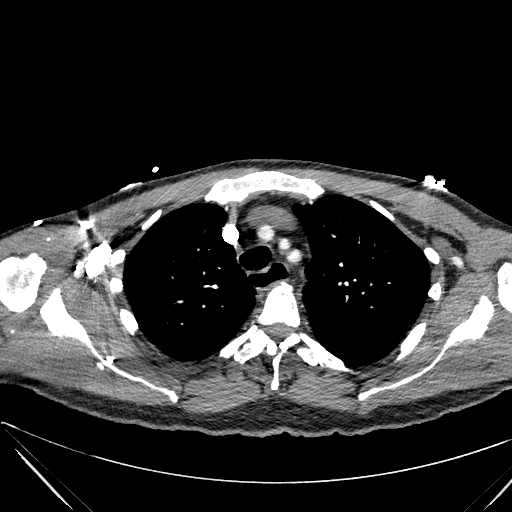
[im 214/297  lung]
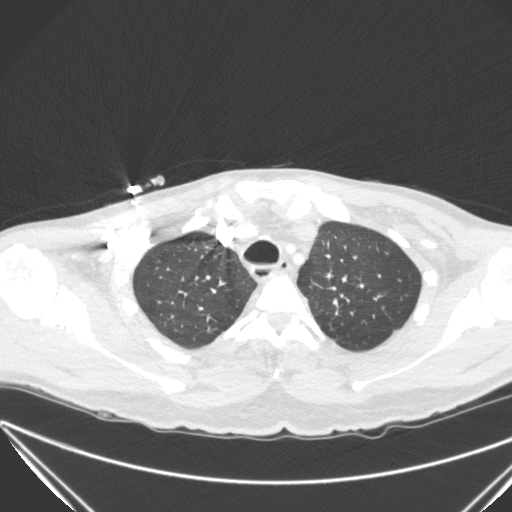
[im 231/297  mediastinal]
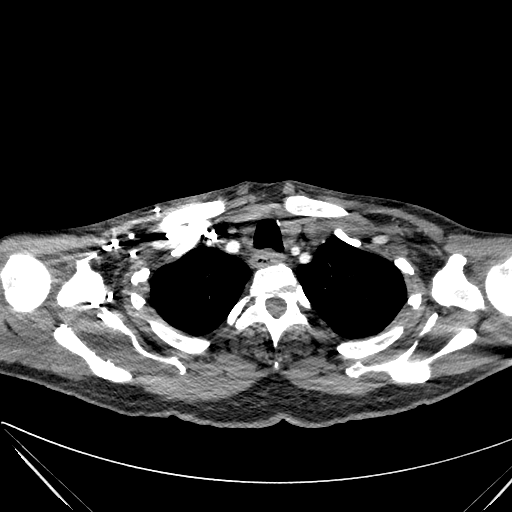
[im 247/297  lung]
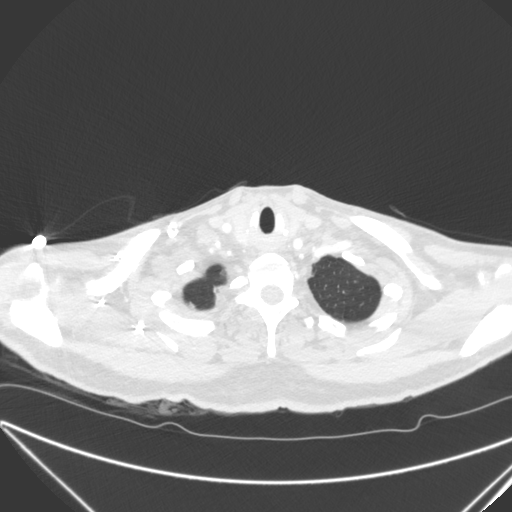
[im 264/297  mediastinal]
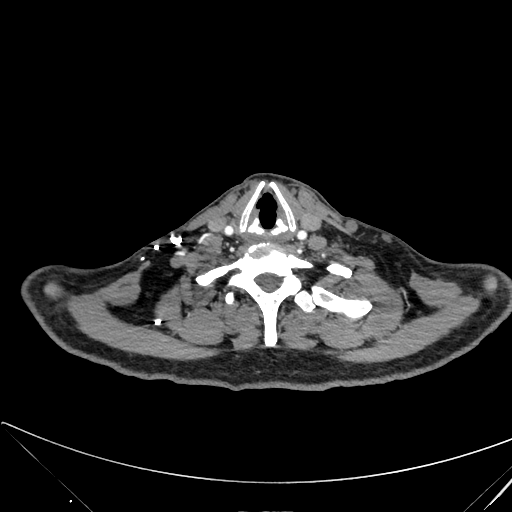
[im 280/297  lung]
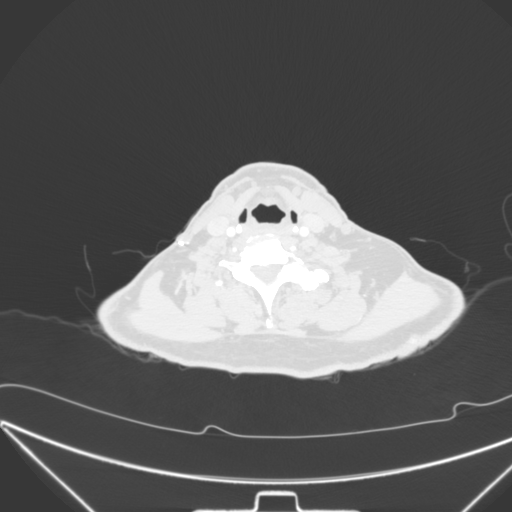

[mpr, coronals, coronal · coronal · 0.75mm/px · 1 of 122 slices shown]
[im 61/122  mediastinal]
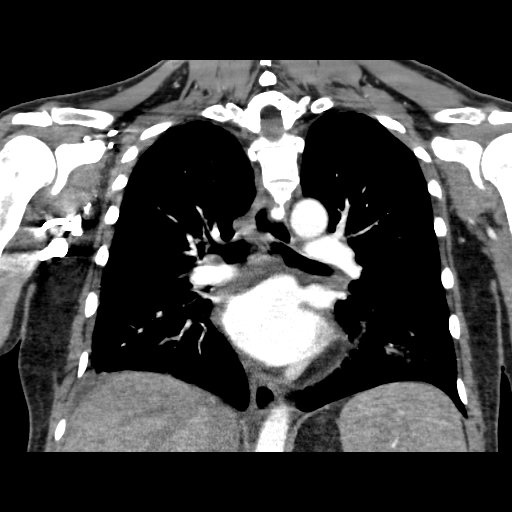

[18 of 36 positions shown; findings below may reference images not displayed]

FINDINGS: Negative for pulmonary embolism. No aortic abnormality. No
mediastinal or hilar adenopathy. No pericardial effusion is present.
Incidental imaging the upper abdomen is grossly within normal
limits. Small bilateral pleural effusions layer posteriorly.
Airspace disease is present at the lung bases bilaterally, along
with interlobular septal thickening and thickening of the fissures
and pulmonary ligaments. This is compatible with pulmonary edema and
CHF. Calcified granuloma is present in the right upper lobe
measuring 9 mm (image 76 series 6). There are no aggressive osseous
lesions. Thoracic spondylosis. Lower lobe bronchi a demonstrate
thickening, probably secondary to volume overload and pulmonary
edema. Bronchitis can produce this appearance as well.

Review of the MIP images confirms the above findings.
IMPRESSION: 1. Negative for pulmonary embolism or acute aortic abnormality.
2. Small bilateral pleural effusions. Septal thickening and
ground-glass attenuation at the bases is most compatible with
pulmonary edema. The findings suggest mild CHF.

## 2015-11-02 ENCOUNTER — Other Ambulatory Visit: Payer: BLUE CROSS/BLUE SHIELD

## 2015-11-02 ENCOUNTER — Ambulatory Visit: Payer: BLUE CROSS/BLUE SHIELD | Admitting: Hematology & Oncology

## 2015-11-11 ENCOUNTER — Other Ambulatory Visit: Payer: Self-pay | Admitting: Hematology & Oncology

## 2015-11-15 ENCOUNTER — Other Ambulatory Visit: Payer: Self-pay | Admitting: *Deleted

## 2015-11-15 ENCOUNTER — Ambulatory Visit (HOSPITAL_COMMUNITY)
Admission: RE | Admit: 2015-11-15 | Discharge: 2015-11-15 | Disposition: A | Discharge status: Home / Self Care | Payer: BLUE CROSS/BLUE SHIELD | Source: Ambulatory Visit | Attending: Hematology & Oncology | Admitting: Hematology & Oncology

## 2015-11-15 DIAGNOSIS — D474 Osteomyelofibrosis: Secondary | ICD-10-CM

## 2015-11-17 ENCOUNTER — Other Ambulatory Visit: Payer: Self-pay | Admitting: Hematology & Oncology

## 2015-11-17 DIAGNOSIS — D474 Osteomyelofibrosis: Secondary | ICD-10-CM

## 2015-11-18 ENCOUNTER — Other Ambulatory Visit: Payer: BLUE CROSS/BLUE SHIELD

## 2015-11-18 ENCOUNTER — Other Ambulatory Visit: Payer: Self-pay | Admitting: *Deleted

## 2015-11-22 ENCOUNTER — Encounter (HOSPITAL_COMMUNITY): Payer: Self-pay

## 2015-11-22 ENCOUNTER — Ambulatory Visit (HOSPITAL_COMMUNITY)
Admission: RE | Admit: 2015-11-22 | Discharge: 2015-11-22 | Disposition: A | Discharge status: Home / Self Care | Payer: BLUE CROSS/BLUE SHIELD | Source: Ambulatory Visit | Attending: Hematology & Oncology | Admitting: Hematology & Oncology

## 2015-11-22 ENCOUNTER — Encounter (INDEPENDENT_AMBULATORY_CARE_PROVIDER_SITE_OTHER): Payer: Self-pay

## 2015-11-22 VITALS — BP 125/63 | HR 83 | Resp 18

## 2015-11-22 DIAGNOSIS — D759 Disease of blood and blood-forming organs, unspecified: Secondary | ICD-10-CM | POA: Insufficient documentation

## 2015-11-22 DIAGNOSIS — D7581 Myelofibrosis: Secondary | ICD-10-CM

## 2015-11-22 DIAGNOSIS — D649 Anemia, unspecified: Secondary | ICD-10-CM | POA: Diagnosis not present

## 2015-11-22 DIAGNOSIS — D474 Osteomyelofibrosis: Secondary | ICD-10-CM

## 2015-11-22 LAB — CBC WITH DIFFERENTIAL/PLATELET
BASOS ABS: 0.1 10*3/uL (ref 0.0–0.1)
Basophils Relative: 1 %
Eosinophils Absolute: 0 10*3/uL (ref 0.0–0.7)
Eosinophils Relative: 0 %
HCT: 20.9 % — ABNORMAL LOW (ref 39.0–52.0)
HEMOGLOBIN: 7 g/dL — AB (ref 13.0–17.0)
LYMPHS ABS: 0.8 10*3/uL (ref 0.7–4.0)
Lymphocytes Relative: 15 %
MCH: 28.3 pg (ref 26.0–34.0)
MCHC: 33.5 g/dL (ref 30.0–36.0)
MCV: 84.6 fL (ref 78.0–100.0)
MONO ABS: 0.9 10*3/uL (ref 0.1–1.0)
MONOS PCT: 17 %
NEUTROS ABS: 3.2 10*3/uL (ref 1.7–7.7)
Neutrophils Relative %: 67 %
Platelets: 174 10*3/uL (ref 150–400)
RBC: 2.47 MIL/uL — ABNORMAL LOW (ref 4.22–5.81)
RDW: 16.4 % — AB (ref 11.5–15.5)
WBC: 5 10*3/uL (ref 4.0–10.5)

## 2015-11-22 LAB — BONE MARROW EXAM

## 2015-11-22 MED ORDER — MIDAZOLAM HCL 5 MG/ML IJ SOLN
10.0000 mg | Freq: Once | INTRAMUSCULAR | Status: DC
  Filled 2015-11-22 (×2): qty 2

## 2015-11-22 MED ORDER — MEPERIDINE HCL 25 MG/ML IJ SOLN
INTRAMUSCULAR | Status: AC | PRN
  Administered 2015-11-22 (×3): 12.5 mg via INTRAVENOUS

## 2015-11-22 MED ORDER — MIDAZOLAM HCL 5 MG/5ML IJ SOLN
INTRAMUSCULAR | Status: AC | PRN
  Administered 2015-11-22: 2.5 mg via INTRAVENOUS

## 2015-11-22 MED ORDER — SODIUM CHLORIDE 0.9 % IV SOLN
Freq: Once | INTRAVENOUS | Status: AC
  Administered 2015-11-22: 07:00:00 via INTRAVENOUS

## 2015-11-22 MED ORDER — MEPERIDINE HCL 50 MG/ML IJ SOLN
50.0000 mg | Freq: Once | INTRAMUSCULAR | Status: DC
  Filled 2015-11-22: qty 1

## 2015-11-22 NOTE — Progress Notes (Signed)
Pt arrived to Short Stay today for his Bone Marrow Biopsy with sedation by Dr Marin Olp. Pt states at 0430 he had a small bowl of cereal. Dr Marin Olp was notified of this and we are to proceed with his biopsy with an adjustment in his sedation medication

## 2015-11-22 NOTE — Sedation Documentation (Signed)
Vital signs stable. 

## 2015-11-22 NOTE — Sedation Documentation (Signed)
Patient is resting comfortably. 

## 2015-11-22 NOTE — Sedation Documentation (Signed)
Dressing to post iliac area Pt placed supine for pressure to site

## 2015-11-22 NOTE — Sedation Documentation (Signed)
Procedure ends

## 2015-11-22 NOTE — Sedation Documentation (Signed)
Family updated as to patient's status.

## 2015-11-22 NOTE — Sedation Documentation (Signed)
dressing CDI 

## 2015-11-22 NOTE — Procedures (Signed)
This is a bone marrow biopsy and aspirate procedure note for Brian Bowen. He is brought to the short stay unit.   He had an IV placed peripherally.  Her do the appropriate timeout procedure at 7:45 AM.  His Mallimpati score is 1. His ASA class is 1.  We then placed some onto his right side. We gave him a total of 2.5 mg of Versed and 37.5 mg of Demerol for IV sedation.  The left posterior iliac crest region was prepped and draped in sterile fashion. 5 mL of 1% lidocaine was able 10 on the skin down to the periosteum.  A scalpel was used to make an incision into the skin.  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 We obtained an excellent bone marrow biopsy core. The patient tolerated the procedure well.  I cleaned and dressed the procedure site sterilely.  I got his wife. I talked to her. I told her that we show results back in 2 days.  Lum Keas

## 2015-11-22 NOTE — Progress Notes (Signed)
Documentation of return medication to [facility]: 2    5mg  /ml versed were removed from [place] for sedation. 2.5 mg IV was given and wasted 2.5 mg. 1 vial of 5mg /ml was returned confirmed by 2 nurses

## 2015-11-22 NOTE — Discharge Instructions (Signed)
Do not drive  For 24 hours Do not go into public places today May resume your regular diet and take home medications as usual May experience small amount of tingling in leg (biopsy side) May take shower and remove bandage in am For any questions or concerns, call If bleeding occurs at site, hold pressure x10 minutes  If continues, call Dr Marin Olp  Bone Marrow Aspiration and Bone Marrow Biopsy, Care After Refer to this sheet in the next few weeks. These instructions provide you with information about caring for yourself after your procedure. Your health care provider may also give you more specific instructions. Your treatment has been planned according to current medical practices, but problems sometimes occur. Call your health care provider if you have any problems or questions after your procedure. WHAT TO EXPECT AFTER THE PROCEDURE After your procedure, it is common to have:  Soreness or tenderness around the puncture site.  Bruising. HOME CARE INSTRUCTIONS  Take medicines only as directed by your health care provider.  Follow your health care provider's instructions about:  Puncture site care.  Bandage (dressing) changes and removal.  Bathe and shower as directed by your health care provider.  Check your puncture site every day for signs of infection. Watch for:  Redness, swelling, or pain.  Fluid, blood, or pus.  Return to your normal activities as directed by your health care provider.  Keep all follow-up visits as directed by your health care provider. This is important. SEEK MEDICAL CARE IF:  You have a fever.  You have uncontrollable bleeding.  You have redness, swelling, or pain at the site of your puncture.  You have fluid, blood, or pus coming from your puncture site.   This information is not intended to replace advice given to you by your health care provider. Make sure you discuss any questions you have with your health care provider.   Document  Released: 02/02/2005 Document Revised: 11/30/2014 Document Reviewed: 07/07/2014 Elsevier Interactive Patient Education 2016 Elsevier Inc. Moderate Conscious Sedation, Adult, Care After Refer to this sheet in the next few weeks. These instructions provide you with information on caring for yourself after your procedure. Your health care provider may also give you more specific instructions. Your treatment has been planned according to current medical practices, but problems sometimes occur. Call your health care provider if you have any problems or questions after your procedure. WHAT TO EXPECT AFTER THE PROCEDURE  After your procedure:  You may feel sleepy, clumsy, and have poor balance for several hours.  Vomiting may occur if you eat too soon after the procedure. HOME CARE INSTRUCTIONS  Do not participate in any activities where you could become injured for at least 24 hours. Do not:  Drive.  Swim.  Ride a bicycle.  Operate heavy machinery.  Cook.  Use power tools.  Climb ladders.  Work from a high place.  Do not make important decisions or sign legal documents until you are improved.  If you vomit, drink water, juice, or soup when you can drink without vomiting. Make sure you have little or no nausea before eating solid foods.  Only take over-the-counter or prescription medicines for pain, discomfort, or fever as directed by your health care provider.  Make sure you and your family fully understand everything about the medicines given to you, including what side effects may occur.  You should not drink alcohol, take sleeping pills, or take medicines that cause drowsiness for at least 24 hours.  If you  smoke, do not smoke without supervision.  If you are feeling better, you may resume normal activities 24 hours after you were sedated.  Keep all appointments with your health care provider. SEEK MEDICAL CARE IF:  Your skin is pale or bluish in color.  You continue to  feel nauseous or vomit.  Your pain is getting worse and is not helped by medicine.  You have bleeding or swelling.  You are still sleepy or feeling clumsy after 24 hours. SEEK IMMEDIATE MEDICAL CARE IF:  You develop a rash.  You have difficulty breathing.  You develop any type of allergic problem.  You have a fever. MAKE SURE YOU:  Understand these instructions.  Will watch your condition.  Will get help right away if you are not doing well or get worse.   This information is not intended to replace advice given to you by your health care provider. Make sure you discuss any questions you have with your health care provider.   Document Released: 05/06/2013 Document Revised: 08/06/2014 Document Reviewed: 05/06/2013 Elsevier Interactive Patient Education [employer].

## 2015-11-22 NOTE — Sedation Documentation (Signed)
Medication dose calculated and verified for:IV DEMEROL 37.5 mg and VERSED IV 2.5mg 

## 2015-11-22 NOTE — Sedation Documentation (Signed)
Dressing CDI 

## 2015-11-24 ENCOUNTER — Telehealth: Payer: Self-pay | Admitting: *Deleted

## 2015-11-24 NOTE — Telephone Encounter (Addendum)
Patient aware of results. Report sent to [place].   ----- Message from Volanda Napoleon, MD sent at 11/24/2015  1:00 PM EDT ----- Call - bone marrow bx still looks the same!!  NO increase in leukemic cells.  Please send this to Dr. Mylinda Latina at [facility]!!  pete

## 2015-12-02 ENCOUNTER — Encounter: Payer: Self-pay | Admitting: Hematology & Oncology

## 2015-12-02 ENCOUNTER — Encounter (HOSPITAL_COMMUNITY): Payer: Self-pay

## 2015-12-02 LAB — CHROMOSOME ANALYSIS, BONE MARROW

## 2016-01-13 ENCOUNTER — Other Ambulatory Visit: Payer: Self-pay | Admitting: *Deleted

## 2016-01-13 DIAGNOSIS — D474 Osteomyelofibrosis: Secondary | ICD-10-CM

## 2016-01-13 MED ORDER — RUXOLITINIB PHOSPHATE 5 MG PO TABS: 5.0000 mg | ORAL_TABLET | Freq: Two times a day (BID) | ORAL | Status: AC

## 2016-05-31 IMAGING — US US ABDOMEN COMPLETE
1 series · 13 of 25 positions shown · non-contrast
Comparison: 04/01/2015

CLINICAL DATA: Myelofibrosis and myeloid metaplasia. Splenomegaly.
Complex cystic left renal lesion.

EXAM:
ABDOMEN ULTRASOUND COMPLETE

[Series 1: us abdomen complete · 0.18mm/px · 13 of 122 slices shown]
[im 1/122]
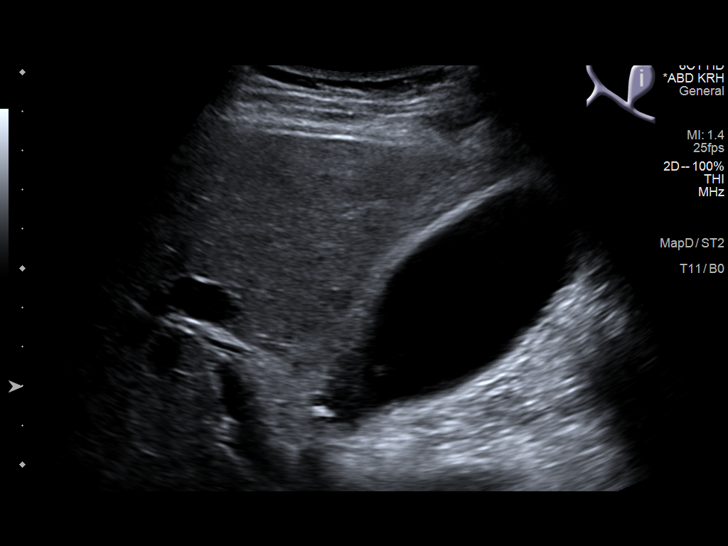
[im 11/122]
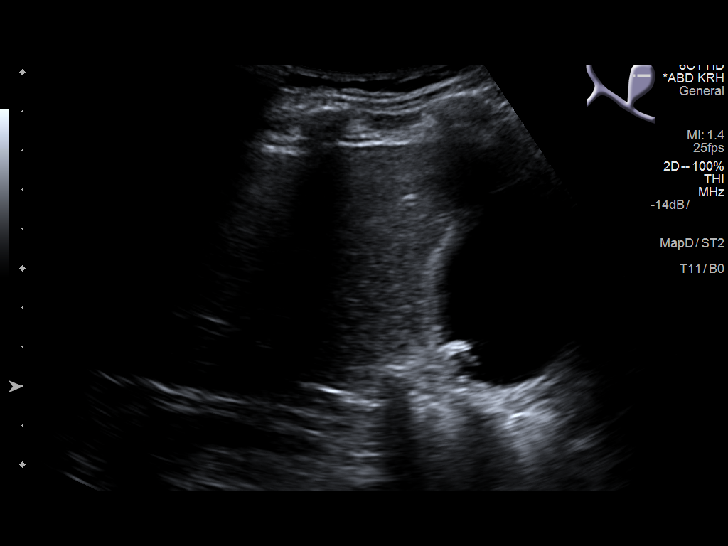
[im 21/122]
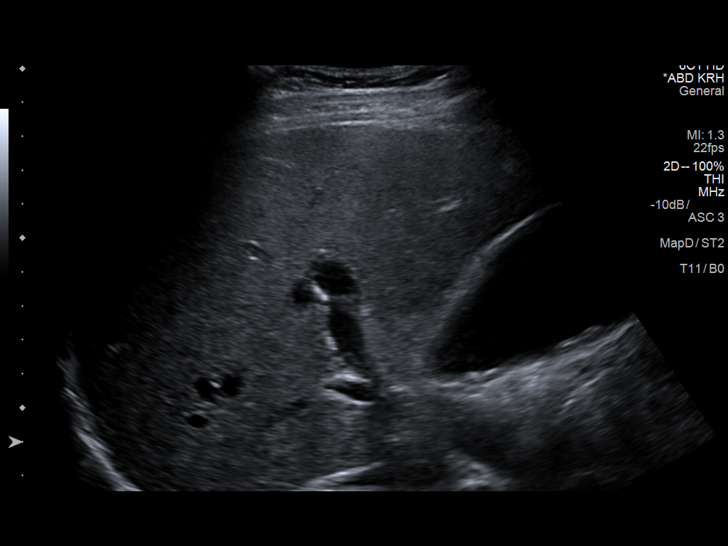
[im 31/122]
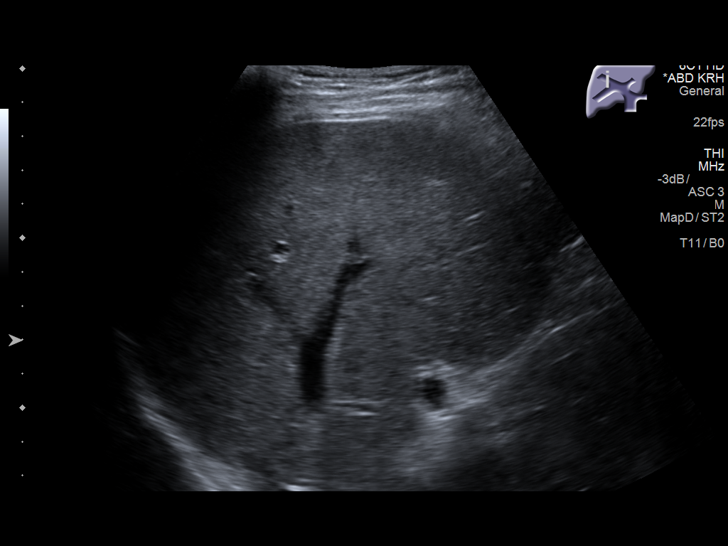
[im 41/122]
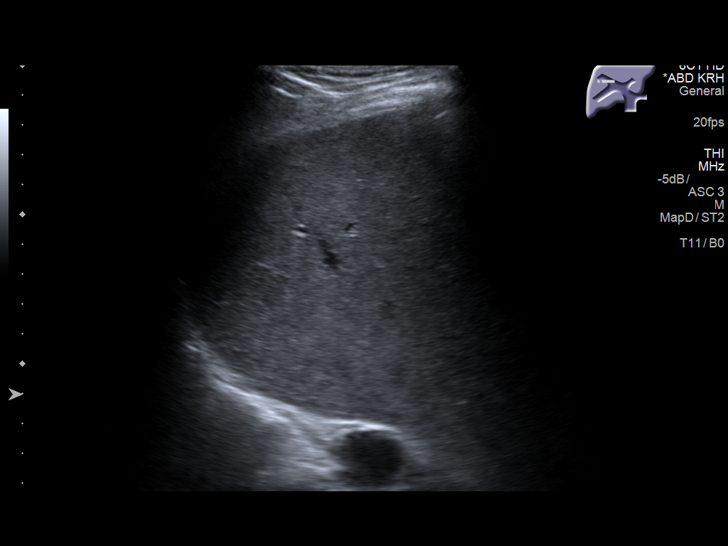
[im 51/122]
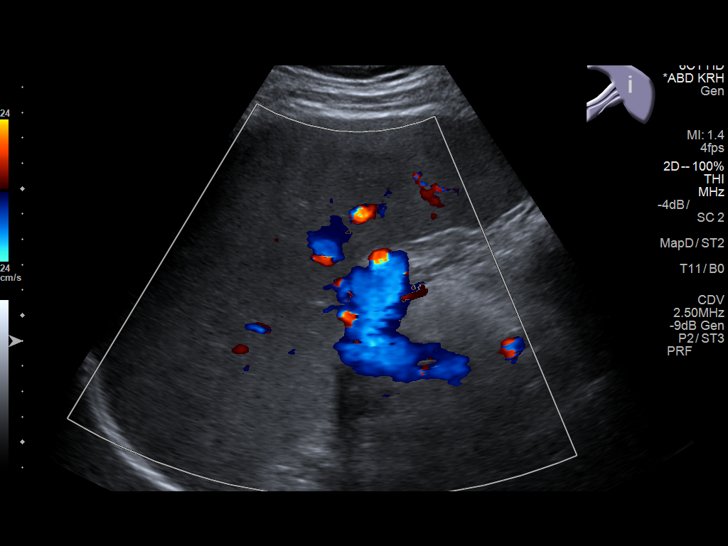
[im 61/122]
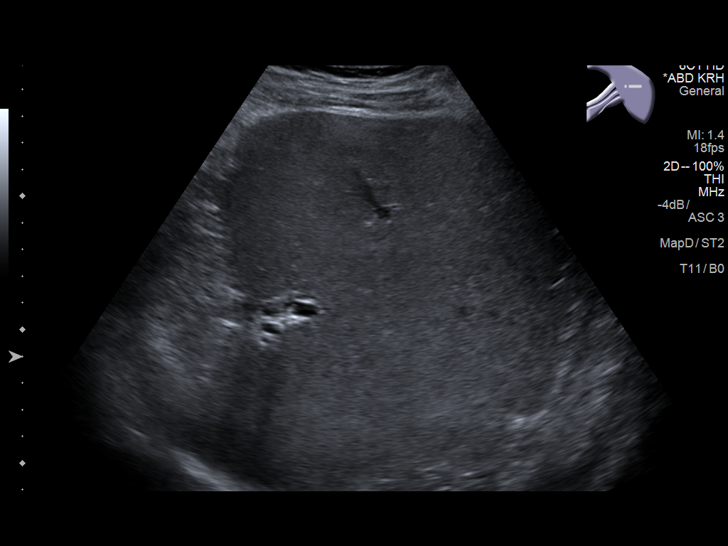
[im 71/122]
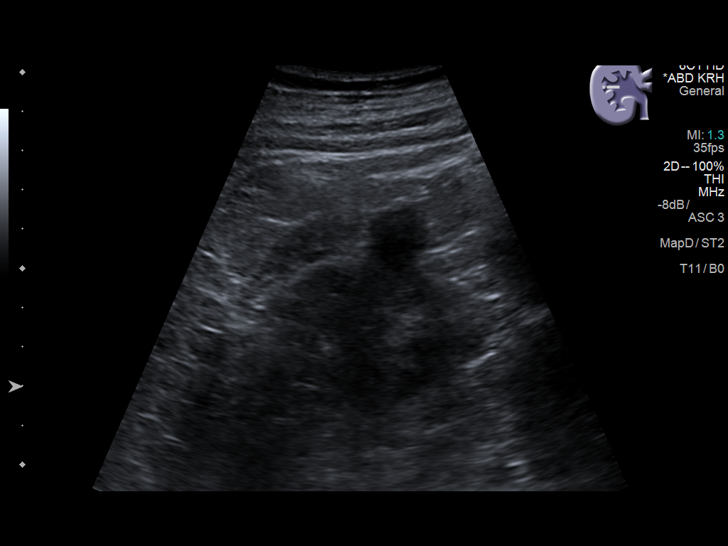
[im 81/122]
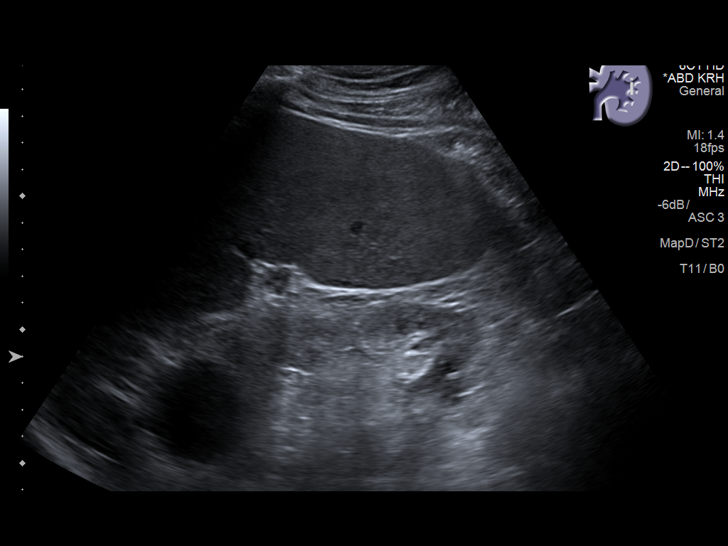
[im 91/122]
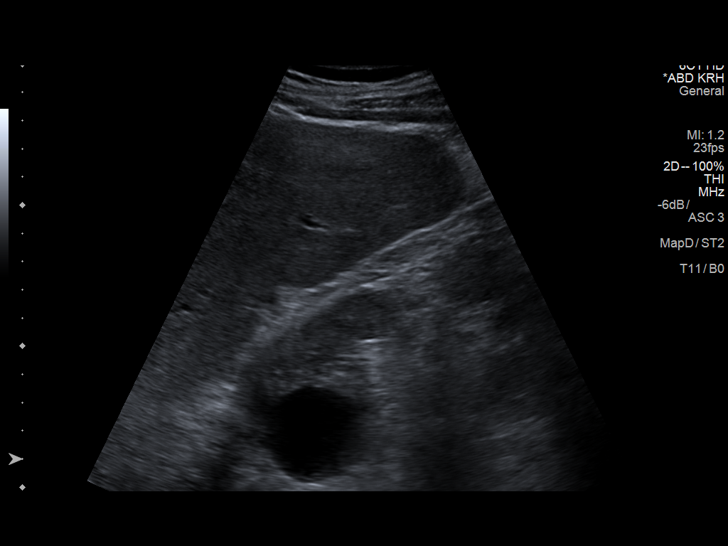
[im 101/122]
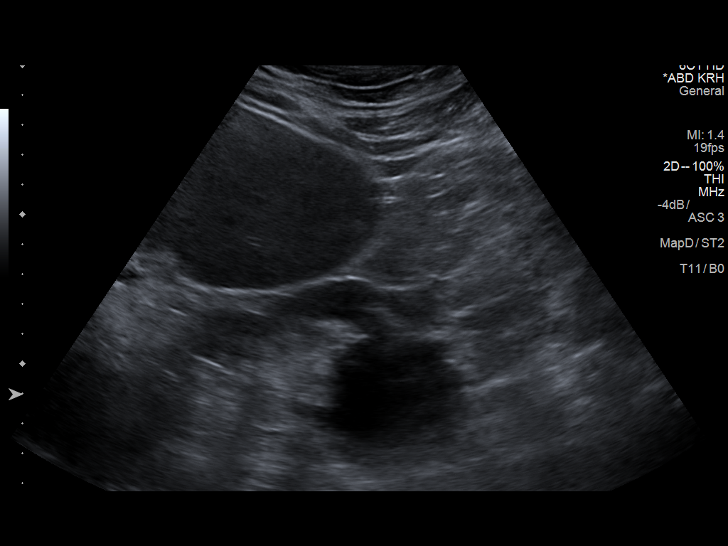
[im 111/122]
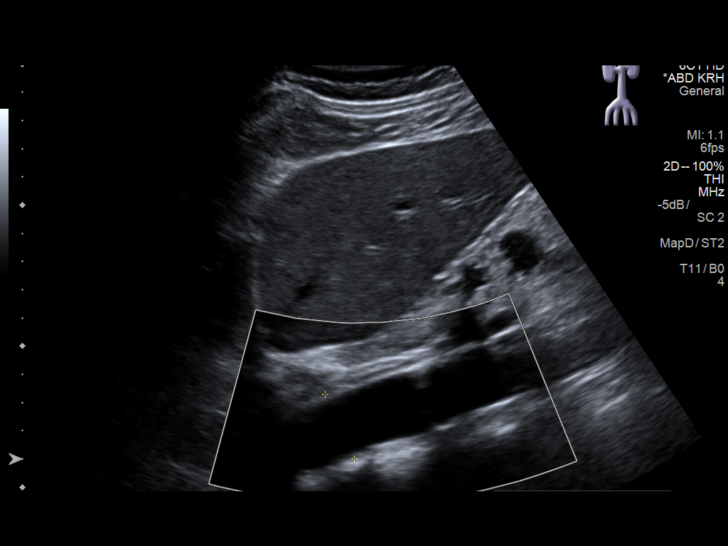
[im 122/122]
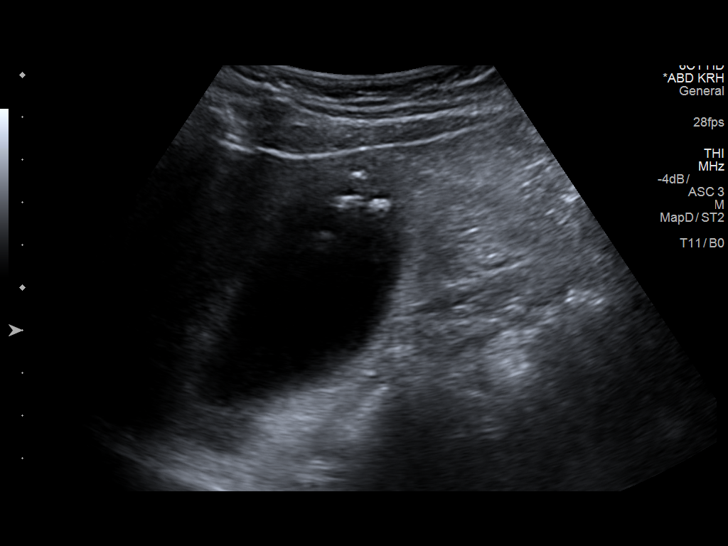

[13 of 25 positions shown; findings below may reference images not displayed]

FINDINGS: Gallbladder: Multiple small gallstones measuring up to 9 mm are
again demonstrated no evidence of gallbladder wall thickening or
pericholecystic fluid. No sonographic Murphy sign noted by
sonographer.

Common bile duct: Diameter: 2 mm, within normal limits.

Liver: No focal lesion identified. Within normal limits in
parenchymal echogenicity.

IVC: No abnormality visualized.

Pancreas: Visualized portion unremarkable.

Spleen: Moderate splenomegaly is demonstrated with estimated splenic
volume 7705 mL. A tiny simple appearing splenic cyst is also seen
measuring 1.1 cm. These findings are stable.

Right Kidney: Length: 10.3 cm. Echogenicity within normal limits.
2.1 cm subcapsular cyst noted in lower pole. No mass or
hydronephrosis visualized.

Left Kidney: Length: 12.6 cm. Echogenicity within normal limits. No
evidence hydronephrosis. Two simple cysts are seen in the upper
midpole of the left kidney measuring 4.0 cm and 1.6 cm respectively.
A complex cystic lesion is again seen in the lower pole the left
kidney which contains several internal septations as well as at
least 1 small soft tissue nodule measuring approximately 8 mm. This
lesion measures 4.8 x 5.4 x 4.5 cm and remains stable in size since
most recent study although it has increased in size and complexity
compared to earlier exam in 1077. These findings are suspicious for
cystic renal neoplasm.

Abdominal aorta: No aneurysm visualized.

Other findings: None.
IMPRESSION: Cholelithiasis again demonstrated. No sonographic signs of acute
cholecystitis or biliary dilatation.

No significant change in moderate splenomegaly and tiny
benign-appearing splenic cyst.

5 cm complex cystic lesion in the left kidney is stable since most
recent exam but shows increased compared to earlier exam in 1077.
This is suspicious for cystic renal neoplasm. Abdomen MRI without
and with contrast is recommended for further characterization.

## 2018-04-29 DEATH — deceased
